# Patient Record
Sex: Male | Born: 1960
Health system: Southern US, Community
[De-identification: ages and names within clinical notes are randomized; demographics above are authoritative.]

## PROBLEM LIST (undated history)

## (undated) DIAGNOSIS — N529 Male erectile dysfunction, unspecified: Secondary | ICD-10-CM

## (undated) DIAGNOSIS — E785 Hyperlipidemia, unspecified: Secondary | ICD-10-CM

## (undated) DIAGNOSIS — T7840XA Allergy, unspecified, initial encounter: Secondary | ICD-10-CM

## (undated) DIAGNOSIS — E119 Type 2 diabetes mellitus without complications: Secondary | ICD-10-CM

## (undated) DIAGNOSIS — I10 Essential (primary) hypertension: Secondary | ICD-10-CM

## (undated) DIAGNOSIS — J302 Other seasonal allergic rhinitis: Secondary | ICD-10-CM

## (undated) DIAGNOSIS — G4730 Sleep apnea, unspecified: Secondary | ICD-10-CM

## (undated) HISTORY — DX: Essential (primary) hypertension: I10

## (undated) HISTORY — DX: Allergy, unspecified, initial encounter: T78.40XA

## (undated) HISTORY — DX: Type 2 diabetes mellitus without complications: E11.9

## (undated) HISTORY — DX: Other seasonal allergic rhinitis: J30.2

## (undated) HISTORY — DX: Male erectile dysfunction, unspecified: N52.9

## (undated) HISTORY — DX: Sleep apnea, unspecified: G47.30

## (undated) HISTORY — DX: Hyperlipidemia, unspecified: E78.5

## (undated) HISTORY — PX: OTHER SURGICAL HISTORY: SHX169

---

## 2005-09-04 ENCOUNTER — Encounter: Admission: RE | Admit: 2005-09-04 | Discharge: 2005-09-04 | Payer: Self-pay | Admitting: Internal Medicine

## 2006-12-30 ENCOUNTER — Ambulatory Visit: Payer: Self-pay | Admitting: Internal Medicine

## 2007-01-20 ENCOUNTER — Emergency Department (HOSPITAL_COMMUNITY): Admission: EM | Admit: 2007-01-20 | Discharge: 2007-01-20 | Payer: Self-pay | Admitting: Emergency Medicine

## 2007-01-21 ENCOUNTER — Ambulatory Visit: Payer: Self-pay | Admitting: *Deleted

## 2007-02-21 ENCOUNTER — Ambulatory Visit: Payer: Self-pay | Admitting: Family Medicine

## 2007-02-21 ENCOUNTER — Encounter (INDEPENDENT_AMBULATORY_CARE_PROVIDER_SITE_OTHER): Payer: Self-pay | Admitting: Internal Medicine

## 2007-02-21 LAB — CONVERTED CEMR LAB
Cholesterol: 178 mg/dL
HDL: 47 mg/dL
Triglycerides: 83 mg/dL

## 2007-02-22 ENCOUNTER — Encounter (INDEPENDENT_AMBULATORY_CARE_PROVIDER_SITE_OTHER): Payer: Self-pay | Admitting: Internal Medicine

## 2007-02-22 LAB — CONVERTED CEMR LAB: PSA: 0.96 ng/mL

## 2007-03-18 ENCOUNTER — Ambulatory Visit: Payer: Self-pay | Admitting: Internal Medicine

## 2007-04-21 ENCOUNTER — Encounter (INDEPENDENT_AMBULATORY_CARE_PROVIDER_SITE_OTHER): Payer: Self-pay | Admitting: Internal Medicine

## 2007-04-21 DIAGNOSIS — J301 Allergic rhinitis due to pollen: Secondary | ICD-10-CM | POA: Insufficient documentation

## 2007-04-21 DIAGNOSIS — E785 Hyperlipidemia, unspecified: Secondary | ICD-10-CM | POA: Insufficient documentation

## 2007-04-21 DIAGNOSIS — E1159 Type 2 diabetes mellitus with other circulatory complications: Secondary | ICD-10-CM | POA: Insufficient documentation

## 2007-04-21 DIAGNOSIS — I10 Essential (primary) hypertension: Secondary | ICD-10-CM | POA: Insufficient documentation

## 2007-05-08 DIAGNOSIS — H11009 Unspecified pterygium of unspecified eye: Secondary | ICD-10-CM | POA: Insufficient documentation

## 2007-05-08 DIAGNOSIS — N529 Male erectile dysfunction, unspecified: Secondary | ICD-10-CM | POA: Insufficient documentation

## 2007-07-13 ENCOUNTER — Telehealth (INDEPENDENT_AMBULATORY_CARE_PROVIDER_SITE_OTHER): Payer: Self-pay | Admitting: Family Medicine

## 2007-07-20 ENCOUNTER — Telehealth (INDEPENDENT_AMBULATORY_CARE_PROVIDER_SITE_OTHER): Payer: Self-pay | Admitting: Family Medicine

## 2007-07-26 ENCOUNTER — Ambulatory Visit: Payer: Self-pay | Admitting: Family Medicine

## 2007-08-30 ENCOUNTER — Ambulatory Visit: Payer: Self-pay | Admitting: Internal Medicine

## 2007-09-27 ENCOUNTER — Ambulatory Visit: Payer: Self-pay | Admitting: Family Medicine

## 2007-09-27 DIAGNOSIS — B9789 Other viral agents as the cause of diseases classified elsewhere: Secondary | ICD-10-CM

## 2007-10-15 DIAGNOSIS — R7309 Other abnormal glucose: Secondary | ICD-10-CM | POA: Insufficient documentation

## 2007-11-08 ENCOUNTER — Ambulatory Visit: Payer: Self-pay | Admitting: Family Medicine

## 2007-11-08 LAB — CONVERTED CEMR LAB
ALT: 33 units/L (ref 0–53)
Albumin: 4.5 g/dL (ref 3.5–5.2)
BUN: 16 mg/dL (ref 6–23)
Calcium: 9.6 mg/dL (ref 8.4–10.5)
Chlamydia, Swab/Urine, PCR: NEGATIVE
Cholesterol: 135 mg/dL (ref 0–200)
Creatinine, Ser: 1 mg/dL (ref 0.40–1.50)
Eosinophils Absolute: 0.1 10*3/uL (ref 0.0–0.7)
Eosinophils Relative: 3 % (ref 0–5)
GC Probe Amp, Urine: NEGATIVE
HDL: 36 mg/dL — ABNORMAL LOW (ref >39)
Hemoglobin: 17.2 g/dL — ABNORMAL HIGH (ref 13.0–17.0)
LDL Cholesterol: 66 mg/dL (ref 0–99)
Lymphs Abs: 2.4 10*3/uL (ref 0.7–4.0)
Monocytes Absolute: 0.3 10*3/uL (ref 0.1–1.0)
Monocytes Relative: 7 % (ref 3–12)
Neutrophils Relative %: 37 % — ABNORMAL LOW (ref 43–77)
Platelets: 220 10*3/uL (ref 150–400)
RBC: 5.46 M/uL (ref 4.22–5.81)
Total Bilirubin: 0.7 mg/dL (ref 0.3–1.2)
Total Protein: 7.9 g/dL (ref 6.0–8.3)
Triglycerides: 167 mg/dL — ABNORMAL HIGH (ref <150)
VLDL: 33 mg/dL (ref 0–40)

## 2007-11-10 ENCOUNTER — Encounter (INDEPENDENT_AMBULATORY_CARE_PROVIDER_SITE_OTHER): Payer: Self-pay | Admitting: Family Medicine

## 2007-11-15 ENCOUNTER — Ambulatory Visit: Payer: Self-pay | Admitting: Family Medicine

## 2007-11-15 LAB — CONVERTED CEMR LAB: Cholesterol: 130 mg/dL (ref 0–200)

## 2008-02-20 ENCOUNTER — Telehealth (INDEPENDENT_AMBULATORY_CARE_PROVIDER_SITE_OTHER): Payer: Self-pay | Admitting: *Deleted

## 2008-03-21 ENCOUNTER — Ambulatory Visit: Payer: Self-pay | Admitting: Family Medicine

## 2008-06-13 ENCOUNTER — Ambulatory Visit: Payer: Self-pay | Admitting: Family Medicine

## 2008-06-13 DIAGNOSIS — F329 Major depressive disorder, single episode, unspecified: Secondary | ICD-10-CM

## 2008-06-13 DIAGNOSIS — M719 Bursopathy, unspecified: Secondary | ICD-10-CM

## 2008-06-13 DIAGNOSIS — M67919 Unspecified disorder of synovium and tendon, unspecified shoulder: Secondary | ICD-10-CM | POA: Insufficient documentation

## 2008-08-13 ENCOUNTER — Ambulatory Visit: Payer: Self-pay | Admitting: Family Medicine

## 2008-08-15 ENCOUNTER — Telehealth (INDEPENDENT_AMBULATORY_CARE_PROVIDER_SITE_OTHER): Payer: Self-pay | Admitting: Family Medicine

## 2008-08-15 LAB — CONVERTED CEMR LAB
Albumin: 4.3 g/dL (ref 3.5–5.2)
BUN: 18 mg/dL (ref 6–23)
CO2: 24 meq/L (ref 19–32)
Calcium: 9.5 mg/dL (ref 8.4–10.5)
Chloride: 101 meq/L (ref 96–112)
Glucose, Bld: 153 mg/dL — ABNORMAL HIGH (ref 70–99)
HDL: 46 mg/dL (ref >39)
Total Bilirubin: 0.7 mg/dL (ref 0.3–1.2)
Total CHOL/HDL Ratio: 3.8
Triglycerides: 280 mg/dL — ABNORMAL HIGH (ref <150)

## 2008-12-11 ENCOUNTER — Ambulatory Visit: Payer: Self-pay | Admitting: Internal Medicine

## 2008-12-11 ENCOUNTER — Encounter (INDEPENDENT_AMBULATORY_CARE_PROVIDER_SITE_OTHER): Payer: Self-pay | Admitting: Family Medicine

## 2008-12-18 ENCOUNTER — Ambulatory Visit: Payer: Self-pay | Admitting: Family Medicine

## 2008-12-18 LAB — CONVERTED CEMR LAB
Albumin: 4.4 g/dL (ref 3.5–5.2)
BUN: 14 mg/dL (ref 6–23)
CO2: 29 meq/L (ref 19–32)
Chloride: 100 meq/L (ref 96–112)
Cholesterol: 133 mg/dL (ref 0–200)
Glucose, Bld: 87 mg/dL (ref 70–99)
Potassium: 4 meq/L (ref 3.5–5.3)
Total CHOL/HDL Ratio: 3.1
Triglycerides: 85 mg/dL (ref <150)
VLDL: 17 mg/dL (ref 0–40)

## 2008-12-19 ENCOUNTER — Encounter (INDEPENDENT_AMBULATORY_CARE_PROVIDER_SITE_OTHER): Payer: Self-pay | Admitting: Family Medicine

## 2009-01-03 ENCOUNTER — Telehealth (INDEPENDENT_AMBULATORY_CARE_PROVIDER_SITE_OTHER): Payer: Self-pay | Admitting: Family Medicine

## 2009-03-18 ENCOUNTER — Ambulatory Visit: Payer: Self-pay | Admitting: Physician Assistant

## 2009-03-18 DIAGNOSIS — M25539 Pain in unspecified wrist: Secondary | ICD-10-CM

## 2009-03-18 DIAGNOSIS — M25569 Pain in unspecified knee: Secondary | ICD-10-CM | POA: Insufficient documentation

## 2009-03-18 DIAGNOSIS — D409 Neoplasm of uncertain behavior of male genital organ, unspecified: Secondary | ICD-10-CM

## 2009-03-18 LAB — CONVERTED CEMR LAB
CO2: 24 meq/L
Glucose, Bld: 95 mg/dL
Potassium: 3.9 meq/L
RPR Titer: NONREACTIVE
Sodium: 143 meq/L

## 2009-03-19 ENCOUNTER — Encounter: Payer: Self-pay | Admitting: Physician Assistant

## 2009-03-25 ENCOUNTER — Encounter: Payer: Self-pay | Admitting: Physician Assistant

## 2009-05-15 ENCOUNTER — Encounter: Payer: Self-pay | Admitting: Physician Assistant

## 2009-05-29 ENCOUNTER — Ambulatory Visit: Payer: Self-pay | Admitting: Physician Assistant

## 2009-05-29 LAB — CONVERTED CEMR LAB
BUN: 13 mg/dL (ref 6–23)
CO2: 26 meq/L (ref 19–32)
Calcium: 9.4 mg/dL (ref 8.4–10.5)
Chloride: 102 meq/L (ref 96–112)
Creatinine, Ser: 1.12 mg/dL (ref 0.40–1.50)
Potassium: 4.1 meq/L (ref 3.5–5.3)
Total Bilirubin: 0.6 mg/dL (ref 0.3–1.2)
Total Protein: 7.1 g/dL (ref 6.0–8.3)

## 2009-05-30 ENCOUNTER — Encounter: Payer: Self-pay | Admitting: Physician Assistant

## 2009-06-03 ENCOUNTER — Ambulatory Visit (HOSPITAL_COMMUNITY): Admission: RE | Admit: 2009-06-03 | Discharge: 2009-06-03 | Payer: Self-pay | Admitting: Physician Assistant

## 2009-06-05 ENCOUNTER — Encounter: Payer: Self-pay | Admitting: Physician Assistant

## 2009-07-11 ENCOUNTER — Ambulatory Visit: Payer: Self-pay | Admitting: Physician Assistant

## 2009-07-15 ENCOUNTER — Telehealth: Payer: Self-pay | Admitting: Physician Assistant

## 2009-10-15 ENCOUNTER — Telehealth: Payer: Self-pay | Admitting: Physician Assistant

## 2009-10-28 ENCOUNTER — Ambulatory Visit: Payer: Self-pay | Admitting: Physician Assistant

## 2009-10-28 DIAGNOSIS — R9431 Abnormal electrocardiogram [ECG] [EKG]: Secondary | ICD-10-CM

## 2009-10-28 DIAGNOSIS — R0989 Other specified symptoms and signs involving the circulatory and respiratory systems: Secondary | ICD-10-CM

## 2009-10-28 DIAGNOSIS — R0609 Other forms of dyspnea: Secondary | ICD-10-CM

## 2009-10-28 DIAGNOSIS — M545 Low back pain: Secondary | ICD-10-CM

## 2009-10-28 DIAGNOSIS — B360 Pityriasis versicolor: Secondary | ICD-10-CM

## 2009-10-28 LAB — CONVERTED CEMR LAB: Rapid HIV Screen: NEGATIVE

## 2009-10-29 LAB — CONVERTED CEMR LAB
ALT: 26 units/L (ref 0–53)
Albumin: 4.2 g/dL (ref 3.5–5.2)
BUN: 19 mg/dL (ref 6–23)
CO2: 26 meq/L (ref 19–32)
Creatinine, Ser: 1.06 mg/dL (ref 0.40–1.50)
GC Probe Amp, Urine: NEGATIVE
Glucose, Bld: 108 mg/dL — ABNORMAL HIGH (ref 70–99)
LDL Cholesterol: 102 mg/dL — ABNORMAL HIGH (ref 0–99)
Total Bilirubin: 0.4 mg/dL (ref 0.3–1.2)
VLDL: 23 mg/dL (ref 0–40)

## 2009-11-11 ENCOUNTER — Ambulatory Visit: Payer: Self-pay | Admitting: Physician Assistant

## 2009-11-12 ENCOUNTER — Encounter: Payer: Self-pay | Admitting: Physician Assistant

## 2009-11-12 ENCOUNTER — Encounter: Admission: RE | Admit: 2009-11-12 | Discharge: 2010-01-15 | Payer: Self-pay | Admitting: Physician Assistant

## 2009-11-12 DIAGNOSIS — E1165 Type 2 diabetes mellitus with hyperglycemia: Secondary | ICD-10-CM | POA: Insufficient documentation

## 2009-11-12 DIAGNOSIS — Z794 Long term (current) use of insulin: Secondary | ICD-10-CM

## 2009-11-12 LAB — CONVERTED CEMR LAB: Hgb A1c MFr Bld: 7.5 % — ABNORMAL HIGH (ref 4.6–6.1)

## 2009-12-03 ENCOUNTER — Ambulatory Visit: Payer: Self-pay | Admitting: Physician Assistant

## 2009-12-09 ENCOUNTER — Telehealth: Payer: Self-pay | Admitting: Physician Assistant

## 2009-12-12 ENCOUNTER — Ambulatory Visit: Payer: Self-pay | Admitting: Physician Assistant

## 2009-12-12 DIAGNOSIS — M109 Gout, unspecified: Secondary | ICD-10-CM

## 2009-12-17 ENCOUNTER — Ambulatory Visit (HOSPITAL_COMMUNITY): Admission: RE | Admit: 2009-12-17 | Discharge: 2009-12-17 | Payer: Self-pay | Admitting: Internal Medicine

## 2009-12-17 ENCOUNTER — Encounter: Payer: Self-pay | Admitting: Physician Assistant

## 2009-12-17 LAB — CONVERTED CEMR LAB
BUN: 19 mg/dL (ref 6–23)
Basophils Absolute: 0 10*3/uL (ref 0.0–0.1)
Basophils Relative: 1 % (ref 0–1)
CO2: 28 meq/L (ref 19–32)
Calcium: 9.7 mg/dL (ref 8.4–10.5)
Eosinophils Absolute: 0.3 10*3/uL (ref 0.0–0.7)
Glucose, Bld: 70 mg/dL (ref 70–99)
Hemoglobin: 15.5 g/dL (ref 13.0–17.0)
Lymphocytes Relative: 41 % (ref 12–46)
MCHC: 33 g/dL (ref 30.0–36.0)
Platelets: 332 10*3/uL (ref 150–400)
RBC: 5.12 M/uL (ref 4.22–5.81)
Sodium: 142 meq/L (ref 135–145)
WBC: 6.5 10*3/uL (ref 4.0–10.5)

## 2009-12-23 ENCOUNTER — Encounter: Payer: Self-pay | Admitting: Physician Assistant

## 2009-12-24 ENCOUNTER — Ambulatory Visit: Payer: Self-pay | Admitting: Physician Assistant

## 2009-12-24 DIAGNOSIS — J029 Acute pharyngitis, unspecified: Secondary | ICD-10-CM

## 2009-12-24 LAB — CONVERTED CEMR LAB: Microalb, Ur: 0.81 mg/dL (ref 0.00–1.89)

## 2009-12-25 ENCOUNTER — Encounter: Payer: Self-pay | Admitting: Physician Assistant

## 2010-01-07 ENCOUNTER — Ambulatory Visit: Payer: Self-pay | Admitting: Internal Medicine

## 2010-01-27 ENCOUNTER — Encounter: Payer: Self-pay | Admitting: Physician Assistant

## 2010-01-27 ENCOUNTER — Ambulatory Visit (HOSPITAL_BASED_OUTPATIENT_CLINIC_OR_DEPARTMENT_OTHER): Admission: RE | Admit: 2010-01-27 | Discharge: 2010-01-27 | Payer: Self-pay | Admitting: Internal Medicine

## 2010-01-28 ENCOUNTER — Ambulatory Visit: Payer: Self-pay | Admitting: Physician Assistant

## 2010-02-01 ENCOUNTER — Ambulatory Visit: Payer: Self-pay | Admitting: Internal Medicine

## 2010-02-10 ENCOUNTER — Ambulatory Visit: Payer: Self-pay | Admitting: Physician Assistant

## 2010-02-10 ENCOUNTER — Telehealth: Payer: Self-pay | Admitting: Physician Assistant

## 2010-02-10 DIAGNOSIS — R82998 Other abnormal findings in urine: Secondary | ICD-10-CM | POA: Insufficient documentation

## 2010-02-10 LAB — CONVERTED CEMR LAB
Bilirubin Urine: NEGATIVE
Blood Glucose, Fingerstick: 111
Glucose, Urine, Semiquant: NEGATIVE

## 2010-02-11 ENCOUNTER — Encounter: Payer: Self-pay | Admitting: Physician Assistant

## 2010-02-12 ENCOUNTER — Encounter: Payer: Self-pay | Admitting: Physician Assistant

## 2010-02-12 DIAGNOSIS — G4733 Obstructive sleep apnea (adult) (pediatric): Secondary | ICD-10-CM | POA: Insufficient documentation

## 2010-02-12 DIAGNOSIS — G4730 Sleep apnea, unspecified: Secondary | ICD-10-CM

## 2010-02-12 LAB — CONVERTED CEMR LAB
ALT: 22 units/L (ref 0–53)
Alkaline Phosphatase: 62 units/L (ref 39–117)
BUN: 19 mg/dL (ref 6–23)
CO2: 26 meq/L (ref 19–32)
Calcium: 9.4 mg/dL (ref 8.4–10.5)
Casts: NONE SEEN /lpf
Chloride: 101 meq/L (ref 96–112)
Creatinine, Ser: 1.19 mg/dL (ref 0.40–1.50)
Crystals: NONE SEEN
Glucose, Bld: 89 mg/dL (ref 70–99)
HDL: 38 mg/dL — ABNORMAL LOW (ref >39)
PSA: 0.81 ng/mL (ref 0.10–4.00)
Potassium: 4.3 meq/L (ref 3.5–5.3)
RBC / HPF: NONE SEEN (ref <3)
Sodium: 138 meq/L (ref 135–145)
Total Bilirubin: 0.7 mg/dL (ref 0.3–1.2)
Total CHOL/HDL Ratio: 3.8
Triglycerides: 123 mg/dL (ref <150)
VLDL: 25 mg/dL (ref 0–40)

## 2010-03-06 ENCOUNTER — Ambulatory Visit: Admission: RE | Admit: 2010-03-06 | Discharge: 2010-03-06 | Payer: Self-pay | Admitting: Internal Medicine

## 2010-03-06 ENCOUNTER — Encounter: Payer: Self-pay | Admitting: Physician Assistant

## 2010-03-06 ENCOUNTER — Encounter (INDEPENDENT_AMBULATORY_CARE_PROVIDER_SITE_OTHER): Payer: Self-pay | Admitting: Internal Medicine

## 2010-03-12 ENCOUNTER — Ambulatory Visit: Payer: Self-pay | Admitting: Physician Assistant

## 2010-03-24 ENCOUNTER — Encounter (INDEPENDENT_AMBULATORY_CARE_PROVIDER_SITE_OTHER): Payer: Self-pay | Admitting: *Deleted

## 2010-03-26 ENCOUNTER — Encounter: Payer: Self-pay | Admitting: Physician Assistant

## 2010-03-26 ENCOUNTER — Ambulatory Visit: Payer: Self-pay | Admitting: Gastroenterology

## 2010-03-26 DIAGNOSIS — I517 Cardiomegaly: Secondary | ICD-10-CM

## 2010-04-09 ENCOUNTER — Ambulatory Visit: Payer: Self-pay | Admitting: Gastroenterology

## 2010-04-11 ENCOUNTER — Encounter: Payer: Self-pay | Admitting: Gastroenterology

## 2010-04-18 ENCOUNTER — Encounter: Payer: Self-pay | Admitting: Gastroenterology

## 2010-04-21 ENCOUNTER — Encounter: Payer: Self-pay | Admitting: Physician Assistant

## 2010-04-21 DIAGNOSIS — K621 Rectal polyp: Secondary | ICD-10-CM

## 2010-04-21 DIAGNOSIS — K620 Anal polyp: Secondary | ICD-10-CM | POA: Insufficient documentation

## 2010-05-16 ENCOUNTER — Ambulatory Visit: Payer: Self-pay | Admitting: Physician Assistant

## 2010-06-13 ENCOUNTER — Ambulatory Visit: Payer: Self-pay | Admitting: Internal Medicine

## 2010-09-23 NOTE — Assessment & Plan Note (Signed)
Summary: left knee swelling///kt   Vital Signs:  Patient profile:   50 year old male Height:      66 inches Weight:      205 pounds BMI:     33.21 Temp:     97.5 degrees F oral Pulse rate:   77 / minute Pulse rhythm:   regular Resp:     18 per minute BP sitting:   144 / 89  (left arm) Cuff size:   large  Vitals Entered By: [place] Shannon (Dec 24, 2009 2:07 PM) CC: f/u on left knee..., Hypertension Management Is Patient Diabetic? Yes Pain Assessment Patient in pain? no       Does patient need assistance? Functional Status Self care Ambulation Normal   Primary Care Provider:  Tereso Newcomer PA-C  CC:  f/u on left knee... and Hypertension Management.  History of Present Illness: Here for follow up.  Complaining of sore throat for 5 days.  Symptoms worse since last night.  No fever but brief chills.  Notes cough with thick yellow sputum.  Cough started about 5 days ago.  No hemoptysis or dyspnea.  No chest pain.  Has not taken anything for it.  Noted eye discharge and redness this am.  Discharge was yellow.  Using allergy drops.  Does note some discharge on eyelids.  Does have allergies.  Allergies usually result in red eyes and itching.  So, not much difference in his eye symptoms.  Some pain that has resolved.  Some blurry vision that has resolved.  Gout:  Pain in right great toe improved.  Still has some pain.  Only taking Indocin as needed but is having to take every day.  Uric acid level was elevated.  Will need to start allopurinol when his pain is resolved.  Knee pain is better.  He is going to restart PT soon.  DM:  Discussed role of medications and need to get A1C below 7.  Discussed complications assoc with diabetes.  He has changed diet.  Explained that I will start meds if next A1C is above 7.   Hypertension History:      Positive major cardiovascular risk factors include male age 62 years old or older, diabetes, hyperlipidemia, and hypertension.  Negative major  cardiovascular risk factors include negative family history for ischemic heart disease and non-tobacco-user status.     Current Medications (verified): 1)  Hydrochlorothiazide 25 Mg  Tabs (Hydrochlorothiazide) .... Once Daily 2)  Diovan 320 Mg Tabs (Valsartan) .... Take 1 Tablet By Mouth Every Morning 3)  Crestor 10 Mg  Tabs (Rosuvastatin Calcium) .Marland Kitchen.. 1 Tab Po Daily 4)  Ibuprofen 800 Mg Tabs (Ibuprofen) .... Hold - Started On Indomethacin For Gout 5)  Veramyst 27.5 Mcg/spray Susp (Fluticasone Furoate) .... Use 2 Sprays in Each Nostril Once Daily 6)  Allegra 180 Mg Tabs (Fexofenadine Hcl) .... Take 1 Tablet By Mouth Once A Day As Needed For Allergies 7)  Tessalon Perles 100 Mg Caps (Benzonatate) .... Take 1 Tablet By Mouth Three Times A Day As Needed For Cough 8)  Tramadol Hcl 50 Mg Tabs (Tramadol Hcl) .... Take 1 Tablet By Mouth Three Times A Day As Needed For Knee Pain 9)  Clonidine Hcl 0.2 Mg Tabs (Clonidine Hcl) .... Take 1 Tablet By Mouth Two Times A Day (Note Dose Increase) 10)  Selenium Sulfide 2.5 % Lotn (Selenium Sulfide) .... Apply Once Daily X 7 Days; Then Apply Once A Month For 3 Months. 11)  Indomethacin 25 Mg  Caps (Indomethacin) .Marland Kitchen.. 1-2 Tabs By Mouth Three Times A Day As Needed For Pain  Allergies (verified): 1)  ! Penicillin 2)  ! * Enalapril 3)  ! * Pesticides  Physical Exam  General:  alert, well-developed, and well-nourished.   Head:  normocephalic and atraumatic.   Eyes:  pupils equal, pupils round, pupils reactive to light, and conjunctival injection.   Ears:  R ear normal and L ear normal.   Nose:  no external deformity.   Mouth:  pharyngeal erythema.   no exudate  Neck:  no cervical lymphadenopathy.   Lungs:  normal breath sounds, no crackles, and no wheezes.   Heart:  normal rate and regular rhythm.   Msk:  right great toe improved no redness no warmth slight tend to palp  Neurologic:  alert & oriented X3 and cranial nerves II-XII intact.   Psych:   normally interactive.     Impression & Recommendations:  Problem # 1:  DIABETES MELLITUS, TYPE II (ICD-250.00)  Discussed role of medications and need to get A1C below 7.  Discussed complications assoc with diabetes.  He has changed diet.  Explained that I will start meds if next A1C is above 7.  His updated medication list for this problem includes:    Diovan 320 Mg Tabs (Valsartan) .Marland Kitchen... Take 1 tablet by mouth every morning  Orders: T-Urine Microalbumin w/creat. ratio [phone])  Problem # 2:  HYPERTENSION (ICD-401.9) needs better control has had trouble with ED with norvasc and atenolol in past suggest he try norvasc again as beta blockers more likely to cause ED  His updated medication list for this problem includes:    Hydrochlorothiazide 25 Mg Tabs (Hydrochlorothiazide) ..... Once daily    Diovan 320 Mg Tabs (Valsartan) .Marland Kitchen... Take 1 tablet by mouth every morning    Clonidine Hcl 0.2 Mg Tabs (Clonidine hcl) .Marland Kitchen... Take 1 tablet by mouth two times a day (note dose increase)    Norvasc 5 Mg Tabs (Amlodipine besylate) .Marland Kitchen... Take 1 tablet by mouth once a day  Problem # 3:  GOUT, UNSPECIFIED (ICD-274.9) improved start allopurinol once pain is gone  His updated medication list for this problem includes:    Ibuprofen 800 Mg Tabs (Ibuprofen) ..... Hold - started on indomethacin for gout    Indomethacin 25 Mg Caps (Indomethacin) .Marland Kitchen... 1-2 tabs by mouth three times a day as needed for pain    Allopurinol 300 Mg Tabs (Allopurinol) .Marland Kitchen... Take 1 tablet by mouth once a day  Problem # 4:  PENILE LESION (ICD-236.6) will get him to reschedule with derm  Problem # 5:  PHARYNGITIS (ICD-462) concerned about poss of bacterial infection with description of symptoms all to PCN will tx with zpak  His updated medication list for this problem includes:    Ibuprofen 800 Mg Tabs (Ibuprofen) ..... Hold - started on indomethacin for gout    Indomethacin 25 Mg Caps (Indomethacin) .Marland Kitchen... 1-2  tabs by mouth three times a day as needed for pain    Zithromax Z-pak 250 Mg Tabs (Azithromycin) .Marland Kitchen... Take as directed  Complete Medication List: 1)  Hydrochlorothiazide 25 Mg Tabs (Hydrochlorothiazide) .... Once daily 2)  Diovan 320 Mg Tabs (Valsartan) .... Take 1 tablet by mouth every morning 3)  Crestor 10 Mg Tabs (Rosuvastatin calcium) .Marland Kitchen.. 1 tab po daily 4)  Ibuprofen 800 Mg Tabs (Ibuprofen) .... Hold - started on indomethacin for gout 5)  Veramyst 27.5 Mcg/spray Susp (Fluticasone furoate) .... Use 2 sprays in each nostril once  daily 6)  Allegra 180 Mg Tabs (Fexofenadine hcl) .... Take 1 tablet by mouth once a day as needed for allergies 7)  Tessalon Perles 100 Mg Caps (Benzonatate) .... Take 1 tablet by mouth three times a day as needed for cough 8)  Tramadol Hcl 50 Mg Tabs (Tramadol hcl) .... Take 1 tablet by mouth three times a day as needed for knee pain 9)  Clonidine Hcl 0.2 Mg Tabs (Clonidine hcl) .... Take 1 tablet by mouth two times a day (note dose increase) 10)  Selenium Sulfide 2.5 % Lotn (Selenium sulfide) .... Apply once daily x 7 days; then apply once a month for 3 months. 11)  Indomethacin 25 Mg Caps (Indomethacin) .Marland Kitchen.. 1-2 tabs by mouth three times a day as needed for pain 12)  Allopurinol 300 Mg Tabs (Allopurinol) .... Take 1 tablet by mouth once a day 13)  Norvasc 5 Mg Tabs (Amlodipine besylate) .... Take 1 tablet by mouth once a day 14)  Zithromax Z-pak 250 Mg Tabs (Azithromycin) .... Take as directed  Hypertension Assessment/Plan:      The patient's hypertensive risk group is category C: Target organ damage and/or diabetes.  His calculated 10 year risk of coronary heart disease is 18 %.  Today's blood pressure is 144/89.  His blood pressure goal is < 140/90.  Patient Instructions: 1)  Start the Allopurinol once your pain in your toe has resolved. 2)  Schedule lab visit 4-6 weeks after you start Allopurinol to check uric acid level (Dx: 274.9). 3)  [facility] appt with  [facility].  He missed last appt. 4)  Schedule Retasure at [facility] Clinic. 5)  Please schedule a follow-up appointment in 2 months with Edla Para for diabetes and blood pressure.  Prescriptions: ZITHROMAX Z-PAK 250 MG TABS (AZITHROMYCIN) take as directed  #1 pack x 0   Entered and Authorized by:   Tereso Newcomer PA-C   Signed by:   Tereso Newcomer PA-C on 12/24/2009   Method used:   Print then Give to Patient   RxID:   [id number] NORVASC 5 MG TABS (AMLODIPINE BESYLATE) Take 1 tablet by mouth once a day  #30 x 5   Entered and Authorized by:   Tereso Newcomer PA-C   Signed by:   Tereso Newcomer PA-C on 12/24/2009   Method used:   Print then Give to Patient   RxID:   [id number] ALLOPURINOL 300 MG TABS (ALLOPURINOL) Take 1 tablet by mouth once a day  #30 x 5   Entered and Authorized by:   Tereso Newcomer PA-C   Signed by:   Tereso Newcomer PA-C on 12/24/2009   Method used:   Print then Give to Patient   RxID:   [id number]

## 2010-09-23 NOTE — Progress Notes (Signed)
Summary: DROPPED PR PROGRESS NOTE  Phone Note Call from Patient Call back at Home Phone [phone]   Summary of Call: WEAVER PT. PAUL DROPPED OFF HIS PROGRESS NOTE FROM HIS PHYSICAL THERAPY SESSION THIS MORNING, AND WANTS YOU TO LOOK AT IT. Initial call taken by: Leodis Rains,  December 09, 2009 9:38 AM  Follow-up for Phone Call        forward to provider Follow-up by: [place] Shannon,  December 09, 2009 5:11 PM  Additional Follow-up for Phone Call Additional follow up Details #1::        where is it? Additional Follow-up by: Tereso Newcomer PA-C,  December 09, 2009 5:17 PM    Additional Follow-up for Phone Call Additional follow up Details #2::    it is in outguide on your shelf Follow-up by: [place] Shannon,  December 10, 2009 12:26 PM  Additional Follow-up for Phone Call Additional follow up Details #3:: Details for Additional Follow-up Action Taken: He needs a f/u appt. next week. I need to discuss his diabetes with him and find out what is going on with his knee. Tereso Newcomer PA-C  December 10, 2009 1:35 PM  pt comes in tomorrow at 17..Armenia Shannon  December 10, 2009 2:32 PM

## 2010-09-23 NOTE — Letter (Signed)
Summary: *HSN Results Follow up  [facility]  [address] [place], [place] [address]   Phone: [phone]  Fax: [phone]      12/25/2009   AVI KERSCHNER [address] [place], [place]  [address]   Dear  Mr. DEAVON PODGORSKI,                            ____S.Drinkard,FNP   ____D. Gore,FNP       ____B. McPherson,MD   ____V. Rankins,MD    ____E. Mulberry,MD    ____N. Daphine Deutscher, FNP  ____D. Reche Dixon, MD    ____K. Philipp Deputy, MD    __x__S. Alben Spittle, PA-C     This letter is to inform you that your recent test(s):  _______Pap Smear    _______Lab Test     _______X-ray    _______ is within acceptable limits  _______ requires a medication change  _______ requires a follow-up lab visit  _______ requires a follow-up visit with your provider   Comments:  Urine test was ok . . . .no signs of protein in your urine.       _________________________________________________________ If you have any questions, please contact our office                     Sincerely,  Tereso Newcomer PA-C [facility]

## 2010-09-23 NOTE — Progress Notes (Signed)
Summary: GI referral  Phone Note Outgoing Call   Summary of Call: Refer to Dr. Doreatha Martin for screening colo. Initial call taken by: Brynda Rim,  February 10, 2010 9:39 AM

## 2010-09-23 NOTE — Miscellaneous (Signed)
Summary: Rehab Report//INITIAL SUMMARY  Rehab Report//INITIAL SUMMARY   Imported By: Arta Bruce 11/18/2009 10:44:24  _____________________________________________________________________  External Attachment:    Type:   Image     Comment:   External Document

## 2010-09-23 NOTE — Letter (Signed)
Summary: NUTRETIONIST SUMMARY  NUTRETIONIST SUMMARY   Imported By: Arta Bruce 12/12/2009 09:44:14  _____________________________________________________________________  External Attachment:    Type:   Image     Comment:   External Document

## 2010-09-23 NOTE — Miscellaneous (Signed)
Summary: RENEWAL SUMMARY  RENEWAL SUMMARY   Imported By: Arta Bruce 12/23/2009 08:55:10  _____________________________________________________________________  External Attachment:    Type:   Image     Comment:   External Document

## 2010-09-23 NOTE — Assessment & Plan Note (Signed)
Summary: DM and HTN   Vital Signs:  Patient profile:   50 year old male Height:      66 inches Weight:      212 pounds BMI:     34.34 Temp:     97.2 degrees F oral Pulse rate:   78 / minute Pulse rhythm:   regular Resp:     16 per minute BP sitting:   122 / 80  (left arm) Cuff size:   large  Vitals Entered By: [place] Shannon (May 16, 2010 11:45 AM) CC: f/u... med review..., Hypertension Management Is Patient Diabetic? Yes Pain Assessment Patient in pain? no       Does patient need assistance? Functional Status Self care Ambulation Normal   Primary Care Provider:  Tereso Newcomer PA-C  CC:  f/u... med review... and Hypertension Management.  History of Present Illness: Here for f/u.  Feels well.  No complaints.  Now wearing cpap.  Walking about 4 miles a day.  No gout pain. Taking all meds.   He continues on diet therapy for DM.  He needs a retasure.    Hypertension History:      He denies headache, chest pain, dyspnea with exertion, and side effects from treatment.        Positive major cardiovascular risk factors include male age 95 years old or older, diabetes, hyperlipidemia, and hypertension.  Negative major cardiovascular risk factors include negative family history for ischemic heart disease and non-tobacco-user status.     Problems Prior to Update: 1)  Colonic Polyps, Adenomatous, Benign  (ICD-569.0) 2)  Ventricular Hypertrophy, Left  (ICD-429.3) 3)  Sleep Apnea  (ICD-780.57) 4)  Urinalysis, Abnormal  (ICD-791.9) 5)  Preventive Health Care  (ICD-V70.0) 6)  Pharyngitis  (ICD-462) 7)  Gout, Unspecified  (ICD-274.9) 8)  Diabetes Mellitus, Type II  (ICD-250.00) 9)  Lumbago  (ICD-724.2) 10)  Tinea Versicolor  (ICD-111.0) 11)  Electrocardiogram, Abnormal  (ICD-794.31) 12)  Snoring  (ICD-786.09) 13)  Wrist Pain, Left  (ICD-719.43) 14)  Knee Pain, Bilateral  (ICD-719.46) 15)  Penile Lesion  (ICD-236.6) 16)  Other Abnormal Glucose  (ICD-790.29) 17)  Rotator  Cuff Syndrome, Left  (ICD-726.10) 18)  Depression  (ICD-311) 19)  Std Screening  (ICD-V01.6) 20)  Viral Infection, Acute  (ICD-079.99) 21)  Erectile Dysfunction, Secondary To Medication  (ICD-607.84) 22)  Pterygium, Bilateral  (ICD-372.40) 23)  Dislocation, Recurrent, R Shoulder  (ICD-718.31) 24)  Hypertension  (ICD-401.9) 25)  Hyperlipidemia  (ICD-272.4) 26)  Allergic Rhinitis, Seasonal  (ICD-477.0)  Current Medications (verified): 1)  Hydrochlorothiazide 25 Mg  Tabs (Hydrochlorothiazide) .... Once Daily 2)  Diovan 320 Mg Tabs (Valsartan) .... Take 1 Tablet By Mouth Every Morning 3)  Crestor 10 Mg  Tabs (Rosuvastatin Calcium) .Marland Kitchen.. 1 Tab Po Daily 4)  Xyzal 5 Mg Tabs (Levocetirizine Dihydrochloride) .... Take One Tab Daily As Needed For Alletgies 5)  Tramadol Hcl 50 Mg Tabs (Tramadol Hcl) .... Take 1 Tablet By Mouth Three Times A Day As Needed For Knee Pain 6)  Clonidine Hcl 0.2 Mg Tabs (Clonidine Hcl) .... Take 1 Tablet By Mouth Two Times A Day (Note Dose Increase) 7)  Indomethacin 25 Mg Caps (Indomethacin) .Marland Kitchen.. 1-2 Tabs By Mouth Three Times A Day As Needed For Pain 8)  Allopurinol 300 Mg Tabs (Allopurinol) .... Take 1 Tablet By Mouth Once A Day 9)  Norvasc 5 Mg Tabs (Amlodipine Besylate) .... Take 1 Tablet By Mouth Once A Day 10)  Fish Oil 1000 Mg Caps (Omega-3 Fatty  Acids) .... Take 1 Capsule By Mouth Once A Day  Allergies (verified): 1)  ! Penicillin 2)  ! * Enalapril 3)  ! * Pesticides  Past History:  Past Medical History: ERECTILE DYSFUNCTION, SECONDARY TO MEDICATION (ICD-607.84) PTERYGIUM, BILATERAL (ICD-372.40) DISLOCATION, RECURRENT, R SHOULDER (ICD-718.31) HYPERTENSION (ICD-401.9) HYPERLIPIDEMIA (ICD-272.4) ALLERGIC RHINITIS, SEASONAL (ICD-477.0) Sleep Apnea (AHI 20.6 per hour - dx 01/2010)   a.  on CPAP Echo 02/2010:  Mod to Severe LVH; EF 65-70%; Grade 2 diastolic dysfxn; mild LAE Colo 8.17.2011:  polyps; path:  tubular adenomas, neg for high grade dysplasia or  malignancy    a.  repeat colo 2016  Physical Exam  General:  alert, well-developed, and well-nourished.   Head:  normocephalic and atraumatic.   Eyes:  conjunctival injection.   Neck:  no thyromegaly.   Lungs:  normal breath sounds, no crackles, and no wheezes.   Heart:  normal rate and regular rhythm.   Extremities:  no edema  Neurologic:  alert & oriented X3, cranial nerves II-XII intact, and gait normal.   Psych:  normally interactive.     Impression & Recommendations:  Problem # 1:  DIABETES MELLITUS, TYPE II (ICD-250.00) well controlled  His updated medication list for this problem includes:    Diovan 320 Mg Tabs (Valsartan) .Marland Kitchen... Take 1 tablet by mouth every morning  Orders: Capillary Blood Glucose/CBG (8[address]48) Hemoglobin A1C ([address])  Problem # 2:  HYPERTENSION (ICD-401.9) well controlled doing so much better suspect tx of OSA is helping as well  His updated medication list for this problem includes:    Hydrochlorothiazide 25 Mg Tabs (Hydrochlorothiazide) ..... Once daily    Diovan 320 Mg Tabs (Valsartan) .Marland Kitchen... Take 1 tablet by mouth every morning    Clonidine Hcl 0.2 Mg Tabs (Clonidine hcl) .Marland Kitchen... Take 1 tablet by mouth two times a day (note dose increase)    Norvasc 5 Mg Tabs (Amlodipine besylate) .Marland Kitchen... Take 1 tablet by mouth once a day  Problem # 3:  SLEEP APNEA (ICD-780.57) now on CPAP  Problem # 4:  GOUT, UNSPECIFIED (ICD-274.9) no attacks  His updated medication list for this problem includes:    Indomethacin 25 Mg Caps (Indomethacin) .Marland Kitchen... 1-2 tabs by mouth three times a day as needed for pain    Allopurinol 300 Mg Tabs (Allopurinol) .Marland Kitchen... Take 1 tablet by mouth once a day  Complete Medication List: 1)  Hydrochlorothiazide 25 Mg Tabs (Hydrochlorothiazide) .... Once daily 2)  Diovan 320 Mg Tabs (Valsartan) .... Take 1 tablet by mouth every morning 3)  Crestor 10 Mg Tabs (Rosuvastatin calcium) .Marland Kitchen.. 1 tab po daily 4)  Xyzal 5 Mg Tabs (Levocetirizine  dihydrochloride) .... Take one tab daily as needed for alletgies 5)  Tramadol Hcl 50 Mg Tabs (Tramadol hcl) .... Take 1 tablet by mouth three times a day as needed for knee pain 6)  Clonidine Hcl 0.2 Mg Tabs (Clonidine hcl) .... Take 1 tablet by mouth two times a day (note dose increase) 7)  Indomethacin 25 Mg Caps (Indomethacin) .Marland Kitchen.. 1-2 tabs by mouth three times a day as needed for pain 8)  Allopurinol 300 Mg Tabs (Allopurinol) .... Take 1 tablet by mouth once a day 9)  Norvasc 5 Mg Tabs (Amlodipine besylate) .... Take 1 tablet by mouth once a day 10)  Fish Oil 1000 Mg Caps (Omega-3 fatty acids) .... Take 1 capsule by mouth once a day  Hypertension Assessment/Plan:      The patient's hypertensive risk group is category C: Target  organ damage and/or diabetes.  His calculated 10 year risk of coronary heart disease is 7 %.  Today's blood pressure is 122/80.  His blood pressure goal is < 140/90.  Patient Instructions: 1)  Schedule retasure. 2)  Call in 4 weeks to schedule Flu Shot. 3)  Your A1C is 5.9.  This is great.  Keep up the good work! 4)  Please schedule a follow-up appointment in 4 months for blood pressure and diabetes.   Laboratory Results   Blood Tests     HGBA1C: 5.9%   (Normal Range: Non-Diabetic - 3-6%   Control Diabetic - 6-8%)

## 2010-09-23 NOTE — Assessment & Plan Note (Signed)
Summary: FU WITH Danny Woods PA-C//GK   Vital Signs:  Patient profile:   50 year old male Height:      66 inches Weight:      204 pounds BMI:     33.05 Temp:     98.0 degrees F oral Pulse rate:   92 / minute Pulse rhythm:   regular Resp:     20 per minute BP sitting:   150 / 101  (left arm) Cuff size:   large  Vitals Entered By: Armenia Shannon (December 12, 2009 2:36 PM) Is Patient Diabetic? No Pain Assessment Patient in pain? no       Does patient need assistance? Functional Status Self care Ambulation Normal   Primary Care Provider:  Tereso Newcomer PA-C   History of Present Illness: Here for left knee and great toe pain. Rec'd call from N. Artis, PT from [facility] today.  He has had increasing knee pain over the last week.  This was preceded by left great toe pain that started about 2 weeks ago.  It is very tender to touch.  He notes some ?fever and chills.  He drinks alcohol on occasion.  He has had this happen before and had an xray of this other foot in 2007.  He has occ pain in his great toe about every 3-4 months.  SInce his toe pain began, he has had some swelling and pain in his knee.  His knee pain had improved with PT initially.  He denies any injury.  He denies locking.  Did feel like his knee would slip out from underneath of him at times.    Problems Prior to Update: 1)  Gout, Unspecified  (ICD-274.9) 2)  Diabetes Mellitus, Type II  (ICD-250.00) 3)  Lumbago  (ICD-724.2) 4)  Tinea Versicolor  (ICD-111.0) 5)  Electrocardiogram, Abnormal  (ICD-794.31) 6)  Snoring  (ICD-786.09) 7)  Wrist Pain, Left  (ICD-719.43) 8)  Knee Pain, Bilateral  (ICD-719.46) 9)  Penile Lesion  (ICD-236.6) 10)  Other Abnormal Glucose  (ICD-790.29) 11)  Rotator Cuff Syndrome, Left  (ICD-726.10) 12)  Depression  (ICD-311) 13)  Std Screening  (ICD-V01.6) 14)  Viral Infection, Acute  (ICD-079.99) 15)  Erectile Dysfunction, Secondary To Medication  (ICD-607.84) 16)  Pterygium, Bilateral   (ICD-372.40) 17)  Dislocation, Recurrent, R Shoulder  (ICD-718.31) 18)  Hypertension  (ICD-401.9) 19)  Hyperlipidemia  (ICD-272.4) 20)  Allergic Rhinitis, Seasonal  (ICD-477.0)  Current Medications (verified): 1)  Hydrochlorothiazide 25 Mg  Tabs (Hydrochlorothiazide) .... Once Daily 2)  Diovan 320 Mg Tabs (Valsartan) .... Take 1 Tablet By Mouth Every Morning 3)  Crestor 10 Mg  Tabs (Rosuvastatin Calcium) .Marland Kitchen.. 1 Tab Po Daily 4)  Ibuprofen 800 Mg Tabs (Ibuprofen) .... Take 1 Tablet By Mouth Every 8 Hours 5)  Veramyst 27.5 Mcg/spray Susp (Fluticasone Furoate) .... Use 2 Sprays in Each Nostril Once Daily 6)  Allegra 180 Mg Tabs (Fexofenadine Hcl) .... Take 1 Tablet By Mouth Once A Day As Needed For Allergies 7)  Tessalon Perles 100 Mg Caps (Benzonatate) .... Take 1 Tablet By Mouth Three Times A Day As Needed For Cough 8)  Tramadol Hcl 50 Mg Tabs (Tramadol Hcl) .... Take 1 Tablet By Mouth Three Times A Day As Needed For Knee Pain 9)  Clonidine Hcl 0.2 Mg Tabs (Clonidine Hcl) .... Take 1 Tablet By Mouth Two Times A Day (Note Dose Increase) 10)  Selenium Sulfide 2.5 % Lotn (Selenium Sulfide) .... Apply Once Daily X 7 Days; Then  Apply Once A Month For 3 Months.  Allergies (verified): 1)  ! Penicillin 2)  ! * Enalapril 3)  ! * Pesticides  Physical Exam  General:  alert, well-developed, and well-nourished.   Head:  normocephalic and atraumatic.   Lungs:  normal breath sounds.   Heart:  normal rate and regular rhythm.   Msk:  left great toe swollen and warm + pain to light touch no crepitus  left knee: neg ant drawer neg lachmans neg mcmurray no eff Extremities:  no edema Neurologic:  alert & oriented X3 and cranial nerves II-XII intact.   Psych:  normally interactive.    Diabetes Management Exam:    Foot Exam (with socks and/or shoes not present):       Sensory-Monofilament:          Left foot: normal          Right foot: normal   Impression & Recommendations:  Problem # 1:   GOUT, UNSPECIFIED (ICD-274.9)  tx with indomethacin check uric acid level, cbc and xray consider allopurinol at f/u  His updated medication list for this problem includes:    Ibuprofen 800 Mg Tabs (Ibuprofen) .Marland Kitchen... Take 1 tablet by mouth every 8 hours    Indomethacin 25 Mg Caps (Indomethacin) .Marland Kitchen... 1-2 tabs by mouth three times a day as needed for pain  Orders: T-CBC w/Diff (60454-09811) T-Basic Metabolic Panel (91478-29562) T-Uric Acid (Blood) (13086-57846) Diagnostic X-Ray/Fluoroscopy (Diagnostic X-Ray/Flu)  Problem # 2:  KNEE PAIN, BILATERAL (ICD-719.46) suspect knee pain exacerbated by change in gait from gout will ask PT to extend treatment  His updated medication list for this problem includes:    Ibuprofen 800 Mg Tabs (Ibuprofen) .Marland Kitchen... Take 1 tablet by mouth every 8 hours    Tramadol Hcl 50 Mg Tabs (Tramadol hcl) .Marland Kitchen... Take 1 tablet by mouth three times a day as needed for knee pain    Indomethacin 25 Mg Caps (Indomethacin) .Marland Kitchen... 1-2 tabs by mouth three times a day as needed for pain  Problem # 3:  HYPERTENSION (ICD-401.9) hard to get him to keep appts BP may be up due to pain if still > goal in 2 weeks, add carvedilol/labetalol  His updated medication list for this problem includes:    Hydrochlorothiazide 25 Mg Tabs (Hydrochlorothiazide) ..... Once daily    Diovan 320 Mg Tabs (Valsartan) .Marland Kitchen... Take 1 tablet by mouth every morning    Clonidine Hcl 0.2 Mg Tabs (Clonidine hcl) .Marland Kitchen... Take 1 tablet by mouth two times a day (note dose increase)  Problem # 4:  DIABETES MELLITUS, TYPE II (ICD-250.00) has met with Susie has changed diet will see how he does with this low threshold to start metformin  His updated medication list for this problem includes:    Diovan 320 Mg Tabs (Valsartan) .Marland Kitchen... Take 1 tablet by mouth every morning  Complete Medication List: 1)  Hydrochlorothiazide 25 Mg Tabs (Hydrochlorothiazide) .... Once daily 2)  Diovan 320 Mg Tabs (Valsartan) .... Take  1 tablet by mouth every morning 3)  Crestor 10 Mg Tabs (Rosuvastatin calcium) .Marland Kitchen.. 1 tab po daily 4)  Ibuprofen 800 Mg Tabs (Ibuprofen) .... Take 1 tablet by mouth every 8 hours 5)  Veramyst 27.5 Mcg/spray Susp (Fluticasone furoate) .... Use 2 sprays in each nostril once daily 6)  Allegra 180 Mg Tabs (Fexofenadine hcl) .... Take 1 tablet by mouth once a day as needed for allergies 7)  Tessalon Perles 100 Mg Caps (Benzonatate) .... Take 1 tablet by mouth three  times a day as needed for cough 8)  Tramadol Hcl 50 Mg Tabs (Tramadol hcl) .... Take 1 tablet by mouth three times a day as needed for knee pain 9)  Clonidine Hcl 0.2 Mg Tabs (Clonidine hcl) .... Take 1 tablet by mouth two times a day (note dose increase) 10)  Selenium Sulfide 2.5 % Lotn (Selenium sulfide) .... Apply once daily x 7 days; then apply once a month for 3 months. 11)  Indomethacin 25 Mg Caps (Indomethacin) .Marland Kitchen.. 1-2 tabs by mouth three times a day as needed for pain  Patient Instructions: 1)  Take Indomethacin 1-2 tabs every 8 hours until pain is better.  Then, take as needed.  Make sure you take with food so it does not  bother your stomach.  Ibuprofen, advil, motrin, aleve is very similar to Indomethacin.  Do not take them together. 2)  Please schedule a follow-up appointment in 2 weeks with Lorin Picket for gout and diabetes and blood pressure.  Prescriptions: INDOMETHACIN 25 MG CAPS (INDOMETHACIN) 1-2 tabs by mouth three times a day as needed for pain  #90 x 1   Entered and Authorized by:   Tereso Newcomer PA-C   Signed by:   Tereso Newcomer PA-C on 12/12/2009   Method used:   Print then Give to Patient   RxID:   [id number]   Last LDL:                                                 102 (10/28/2009 10:05:00 PM)        Diabetic Foot Exam Last Podiatry Exam Date: 12/12/2009  Foot Inspection Is there a history of a foot ulcer?              No Is there a foot ulcer now?              No Can the patient see the bottom of  their feet?          Yes Are the shoes appropriate in style and fit?          Yes Is there swelling or an abnormal foot shape?          No Are the toenails long?                No Are the toenails thick?                No Are the toenails ingrown?              No Is there heavy callous build-up?              No Is there a claw toe deformity?                          No Is there elevated skin temperature?            No Is there limited ankle dorsiflexion?            No Is there foot or ankle muscle weakness?            No Do you have pain in calf while walking?           No      Diabetic Foot Care Education :Patient educated on appropriate care of diabetic feet.  10-g (5.07) Semmes-Weinstein Monofilament Test Performed by: Armenia Shannon          Right Foot          Left Foot Visual Inspection               Test Control      normal         normal Site 1         normal         normal Site 2         normal         normal Site 3         normal         normal Site 4         normal         normal Site 5         normal         normal Site 6         normal         normal Site 7         normal         normal Site 8         normal         normal Site 9         normal         normal Site 10         normal         normal  Impression      normal         normal

## 2010-09-23 NOTE — Letter (Signed)
Summary: [facility]  [facility]   Imported By: Arta Bruce 02/25/2010 11:41:37  _____________________________________________________________________  External Attachment:    Type:   Image     Comment:   External Document

## 2010-09-23 NOTE — Letter (Signed)
Summary: [facility] Instructions  [facility]  [address] [place], [place] [address]   Phone: [phone]  Fax: [phone]       Danny Vazquez    [birth year]    MRN: [id number]        Procedure Day Dorna Bloom:  Wednesday 04/09/2010     Arrival Time: 1:00 pm      Procedure Time: 2:00 pm     Location of Procedure:                    _x _   Endoscopy Center (4th Floor)                        PREPARATION FOR COLONOSCOPY WITH MOVIPREP   Starting 5 days prior to your procedure Friday 8/12 do not eat nuts, seeds, popcorn, corn, beans, peas,  salads, or any raw vegetables.  Do not take any fiber supplements (e.g. Metamucil, Citrucel, and Benefiber).  THE DAY BEFORE YOUR PROCEDURE         DATE: Tuesday 8/16  1.  Drink clear liquids the entire day-NO SOLID FOOD  2.  Do not drink anything colored red or purple.  Avoid juices with pulp.  No orange juice.  3.  Drink at least 64 oz. (8 glasses) of fluid/clear liquids during the day to prevent dehydration and help the prep work efficiently.  CLEAR LIQUIDS INCLUDE: Water Jello Ice Popsicles Tea (sugar ok, no milk/cream) Powdered fruit flavored drinks Coffee (sugar ok, no milk/cream) Gatorade Juice: apple, white grape, white cranberry  Lemonade Clear bullion, consomm, broth Carbonated beverages (any kind) Strained chicken noodle soup Hard Candy                             4.  In the morning, mix first dose of MoviPrep solution:    Empty 1 Pouch A and 1 Pouch B into the disposable container    Add lukewarm drinking water to the top line of the container. Mix to dissolve    Refrigerate (mixed solution should be used within 24 hrs)  5.  Begin drinking the prep at 5:00 p.m. The MoviPrep container is divided by 4 marks.   Every 15 minutes drink the solution down to the next mark (approximately 8 oz) until the full liter is complete.   6.  Follow completed prep with 16 oz of clear liquid of your choice (Nothing  red or purple).  Continue to drink clear liquids until bedtime.  7.  Before going to bed, mix second dose of MoviPrep solution:    Empty 1 Pouch A and 1 Pouch B into the disposable container    Add lukewarm drinking water to the top line of the container. Mix to dissolve    Refrigerate  THE DAY OF YOUR PROCEDURE      DATE: Wednesday 8/17  Beginning at 9:00 a.m. (5 hours before procedure):         1. Every 15 minutes, drink the solution down to the next mark (approx 8 oz) until the full liter is complete.  2. Follow completed prep with 16 oz. of clear liquid of your choice.    3. You may drink clear liquids until 12:00 pm (2 HOURS BEFORE PROCEDURE).   MEDICATION INSTRUCTIONS  Unless otherwise instructed, you should take regular prescription medications with a small sip of water   as early as possible the morning of  your procedure.   Additional medication instructions: Do not take HCTZ day of procedure.         OTHER INSTRUCTIONS  You will need a responsible adult at least 51 years of age to accompany you and drive you home.   This person must remain in the waiting room during your procedure.  Wear loose fitting clothing that is easily removed.  Leave jewelry and other valuables at home.  However, you may wish to bring a book to read or  an iPod/MP3 player to listen to music as you wait for your procedure to start.  Remove all body piercing jewelry and leave at home.  Total time from sign-in until discharge is approximately 2-3 hours.  You should go home directly after your procedure and rest.  You can resume normal activities the  day after your procedure.  The day of your procedure you should not:   Drive   Make legal decisions   Operate machinery   Drink alcohol   Return to work  You will receive specific instructions about eating, activities and medications before you leave.    The above instructions have been reviewed and explained to me by   Ezra Sites RN  March 26, 2010 1:33 PM    I fully understand and can verbalize these instructions _____________________________ Date _________

## 2010-09-23 NOTE — Letter (Signed)
Summary: *HSN Results Follow up  [facility]  [address] [place], [place] [address]   Phone: [phone]  Fax: [phone]      03/26/2010   DORIN STOOKS[place] [address] [place], [place]  [address]   Dear  Mr. LOTHAR PREHN,                            ____S.Drinkard,FNP   ____D. Gore,FNP       ____B. McPherson,MD   ____V. Rankins,MD    ____E. Mulberry,MD    ____N. Daphine Deutscher, FNP  ____D. Reche Dixon, MD    ____K. Philipp Deputy, MD    __x__S. Alben Spittle, PA-C     This letter is to inform you that your recent test(s):  Echocardiogram:  This showed that your heart muscle is thick from high blood pressure.  It is important that we keep your pressure down.  Your heart squeeze was good and strong.  Your valves looked normal.         _________________________________________________________ If you have any questions, please contact our office                     Sincerely,  Tereso Newcomer PA-C [facility]

## 2010-09-23 NOTE — Letter (Signed)
Summary: [facility]  [facility]   Imported By: Arta Bruce 05/20/2010 14:25:55  _____________________________________________________________________  External Attachment:    Type:   Image     Comment:   External Document

## 2010-09-23 NOTE — Miscellaneous (Signed)
Summary: [facility]  Clinical Lists Changes  Observations: Added new observation of ALLERGY REV: Done (03/26/2010 12:57)  Pt uses [facility].  Pt given moviprep while in Moviprep

## 2010-09-23 NOTE — Procedures (Signed)
Summary: Colonoscopy  Patient: [name] Note: All result statuses are Final unless otherwise noted.  Tests: (1) Colonoscopy (COL)   COL Colonoscopy           DONE     [facility]     [address]     [place], [place]  [address]           COLONOSCOPY PROCEDURE REPORT           PATIENT:  Danny Vazquez, Danny Vazquez  MR#:  [id number]     BIRTHDATE:  March 25, 1961, 49 yrs. old  GENDER:  male     ENDOSCOPIST:  Vania Rea. Jarold Motto, MD, [facility]     REF. BY:  Julieanne Manson, M.D.     PROCEDURE DATE:  04/09/2010     PROCEDURE:  Colonoscopy with snare polypectomy     ASA CLASS:  Class II     INDICATIONS:  Routine Risk Screening     MEDICATIONS:   Fentanyl 50 mcg IV, Versed 5 mg IV           DESCRIPTION OF PROCEDURE:   After the risks benefits and     alternatives of the procedure were thoroughly explained, informed     consent was obtained.  Digital rectal exam was performed and     revealed no abnormalities.   The LB [id number] endoscope     was introduced through the anus and advanced to the cecum, which     was identified by both the appendix and ileocecal valve, without     limitations.  The quality of the prep was good, using MoviPrep.     The instrument was then slowly withdrawn as the colon was fully     examined.     <<PROCEDUREIMAGES>>           FINDINGS:  A sessile polyp was found in the cecum. FLAT POLYP     COLD SNARE EXCISED.  A sessile polyp was found in the distal     transverse colon. FLAT POLYP COLD SNARE EXCISED.  This was     otherwise a normal examination of the colon.   Retroflexed views     in the rectum revealed no abnormalities.    The scope was then     withdrawn from the patient and the procedure completed.           COMPLICATIONS:  None     ENDOSCOPIC IMPRESSION:     1) Sessile polyp in the cecum     2) Sessile polyp in the distal transverse colon     3) Otherwise normal examination     R/O ADENOMAS.     RECOMMENDATIONS:     1) If the  polyp(s) removed today are proven to be adenomatous     (pre-cancerous) polyps, you will need a repeat colonoscopy in 5     years. Otherwise you should continue to follow colorectal cancer     screening guidelines for "routine risk" patients with colonoscopy     in 10 years.     REPEAT EXAM:  No           ______________________________     Vania Rea. Jarold Motto, MD, [facility]           CC:  Tereso Newcomer PA           n.     eSIGNED:   Vania Rea. Patterson at 04/09/2010 02:52 PM  [place], [place], [id number]  Note: An exclamation mark (!) indicates a result that was not dispersed into the flowsheet. Document Creation Date: 04/09/2010 2:54 PM _______________________________________________________________________  (1) Order result status: Final Collection or observation date-time: 04/09/2010 14:42 Requested date-time:  Receipt date-time:  Reported date-time:  Referring Physician:   Ordering Physician: Sheryn Bison [phone]) Specimen Source:  Source: Launa Grill Order Number: [id number] Lab site:   Appended Document: Colonoscopy     Procedures Next Due Date:    Colonoscopy: 04/2015  Appended Document: Colonoscopy     Procedures Next Due Date:    Colonoscopy: 03/2015  Appended Document: Colonoscopy five-year followup

## 2010-09-23 NOTE — Letter (Signed)
Summary: Patient Notice- Polyp Results  [name]  [address] [place], [place] [address]   Phone: [phone]  Fax: [phone]        April 11, 2010 MRN: [id number]    [name] [address] [place], [place]  [address]    Dear Mr. CHUBA,  I am pleased to inform you that the colon polyp(s) removed during your recent colonoscopy was (were) found to be benign (no cancer detected) upon pathologic examination.  I recommend you have a repeat colonoscopy examination in 5_ years to look for recurrent polyps, as having colon polyps increases your risk for having recurrent polyps or even colon cancer in the future.  Should you develop new or worsening symptoms of abdominal pain, bowel habit changes or bleeding from the rectum or bowels, please schedule an evaluation with either your primary care physician or with me.  Additional information/recommendations:  _X_ No further action with gastroenterology is needed at this time. Please      follow-up with your primary care physician for your other healthcare      needs.  __ Please call [phone] to schedule a return visit to review your      situation.  __ Please keep your follow-up visit as already scheduled.  __ Continue treatment plan as outlined the day of your exam.  Please call us if you are having persistent problems or have questions about your condition that have not been fully answered at this time.  Sincerely,  Mardella Layman MD [facility]  This letter has been electronically signed by your physician.  Appended Document: Patient Notice- Polyp Results letter mailed

## 2010-09-23 NOTE — Letter (Signed)
Summary: REFERRAL//PHYSICAL THERAPY  REFERRAL//PHYSICAL THERAPY   Imported By: Arta Bruce 12/25/2009 12:31:25  _____________________________________________________________________  External Attachment:    Type:   Image     Comment:   External Document

## 2010-09-23 NOTE — Letter (Signed)
Summary: Patient Notice- Polyp Results  [facility]  [address] [place], [place] [address]   Phone: [phone]  Fax: [phone]        April 18, 2010 MRN: [id number]    Danny Vazquez [address] [place], [place]  [address]    Dear Mr. [name],  I am pleased to inform you that the colon polyp(s) removed during your recent colonoscopy was (were) found to be benign (no cancer detected) upon pathologic examination.  I recommend you have a repeat colonoscopy examination in 5_ years to look for recurrent polyps, as having colon polyps increases your risk for having recurrent polyps or even colon cancer in the future.  Should you develop new or worsening symptoms of abdominal pain, bowel habit changes or bleeding from the rectum or bowels, please schedule an evaluation with either your primary care physician or with me.  Additional information/recommendations:  _x_ No further action with gastroenterology is needed at this time. Please      follow-up with your primary care physician for your other healthcare      needs.  __ Please call [phone] to schedule a return visit to review your      situation.  __ Please keep your follow-up visit as already scheduled.  __ Continue treatment plan as outlined the day of your exam.  Please call us if you are having persistent problems or have questions about your condition that have not been fully answered at this time.  Sincerely,  Mardella Layman MD [facility]  This letter has been electronically signed by your physician.

## 2010-09-23 NOTE — Miscellaneous (Signed)
Summary: Rehab Report//PROGRESS NOTE//FAXED  Rehab Report//PROGRESS NOTE//FAXED   Imported By: Arta Bruce 12/12/2009 15:51:33  _____________________________________________________________________  External Attachment:    Type:   Image     Comment:   External Document

## 2010-09-23 NOTE — Assessment & Plan Note (Signed)
Summary: CPE////RJP   Vital Signs:  Patient profile:   50 year old male Height:      66 inches Weight:      204 pounds BMI:     33.05 Temp:     97.8 degrees F oral Pulse rate:   77 / minute Pulse rhythm:   regular Resp:     18 per minute BP sitting:   155 / 93  (left arm)  Vitals Entered By: [place] Shannon (February 10, 2010 8:58 AM) CC: cpe..., Hypertension Management Is Patient Diabetic? No Pain Assessment Patient in pain? no      CBG Result 111  Does patient need assistance? Functional Status Self care Ambulation Normal   Primary Care Provider:  Tereso Newcomer PA-C  CC:  cpe... and Hypertension Management.  History of Present Illness: Here for CPE. Health maint: Pneumovax done 2010. Td done 2009. Discussed colo and he would like to get. Discussed PSA and he would like to get. PHQ9=2.  Answers 1 to ques. 2.  Used to take Zoloft.  Does not want meds now.  Feels ok.  Denies SI.   Abnormal EKG:  ?LVH with strain.  Was to have Echo. Never had done.  Snoring:  Just had sleep study done.  Sounds like he has sleep apnea.  No recors available to me yet.  Penile lesion:  States he lesion on penis eval at derm clinic and told it was benign.  Gout:  Taking allopurinol now.  NO more pain in toe.    Hypertension History:      He denies headache, chest pain, dyspnea with exertion, and syncope.  Further comments include: Not taking Norvasc yet. Marland Kitchen        Positive major cardiovascular risk factors include male age 36 years old or older, diabetes, hyperlipidemia, and hypertension.  Negative major cardiovascular risk factors include negative family history for ischemic heart disease and non-tobacco-user status.     Habits & Providers  Alcohol-Tobacco-Diet     Tobacco Status: never  Exercise-Depression-Behavior     Drug Use: no  Problems Prior to Update: 1)  Urinalysis, Abnormal  (ICD-791.9) 2)  Preventive Health Care  (ICD-V70.0) 3)  Pharyngitis  (ICD-462) 4)  Gout,  Unspecified  (ICD-274.9) 5)  Diabetes Mellitus, Type II  (ICD-250.00) 6)  Lumbago  (ICD-724.2) 7)  Tinea Versicolor  (ICD-111.0) 8)  Electrocardiogram, Abnormal  (ICD-794.31) 9)  Snoring  (ICD-786.09) 10)  Wrist Pain, Left  (ICD-719.43) 11)  Knee Pain, Bilateral  (ICD-719.46) 12)  Penile Lesion  (ICD-236.6) 13)  Other Abnormal Glucose  (ICD-790.29) 14)  Rotator Cuff Syndrome, Left  (ICD-726.10) 15)  Depression  (ICD-311) 16)  Std Screening  (ICD-V01.6) 17)  Viral Infection, Acute  (ICD-079.99) 18)  Erectile Dysfunction, Secondary To Medication  (ICD-607.84) 19)  Pterygium, Bilateral  (ICD-372.40) 20)  Dislocation, Recurrent, R Shoulder  (ICD-718.31) 21)  Hypertension  (ICD-401.9) 22)  Hyperlipidemia  (ICD-272.4) 23)  Allergic Rhinitis, Seasonal  (ICD-477.0)  Current Medications (verified): 1)  Hydrochlorothiazide 25 Mg  Tabs (Hydrochlorothiazide) .... Once Daily 2)  Diovan 320 Mg Tabs (Valsartan) .... Take 1 Tablet By Mouth Every Morning 3)  Crestor 10 Mg  Tabs (Rosuvastatin Calcium) .Marland Kitchen.. 1 Tab Po Daily 4)  Ibuprofen 800 Mg Tabs (Ibuprofen) .... Hold - Started On Indomethacin For Gout 5)  Veramyst 27.5 Mcg/spray Susp (Fluticasone Furoate) .... Use 2 Sprays in Each Nostril Once Daily 6)  Allegra 180 Mg Tabs (Fexofenadine Hcl) .... Take 1 Tablet By Mouth Once A  Day As Needed For Allergies 7)  Tessalon Perles 100 Mg Caps (Benzonatate) .... Take 1 Tablet By Mouth Three Times A Day As Needed For Cough 8)  Tramadol Hcl 50 Mg Tabs (Tramadol Hcl) .... Take 1 Tablet By Mouth Three Times A Day As Needed For Knee Pain 9)  Clonidine Hcl 0.2 Mg Tabs (Clonidine Hcl) .... Take 1 Tablet By Mouth Two Times A Day (Note Dose Increase) 10)  Selenium Sulfide 2.5 % Lotn (Selenium Sulfide) .... Apply Once Daily X 7 Days; Then Apply Once A Month For 3 Months. 11)  Indomethacin 25 Mg Caps (Indomethacin) .Marland Kitchen.. 1-2 Tabs By Mouth Three Times A Day As Needed For Pain 12)  Allopurinol 300 Mg Tabs (Allopurinol)  .... Take 1 Tablet By Mouth Once A Day 13)  Norvasc 5 Mg Tabs (Amlodipine Besylate) .... Take 1 Tablet By Mouth Once A Day 14)  Zithromax Z-Pak 250 Mg Tabs (Azithromycin) .... Take As Directed  Allergies (verified): 1)  ! Penicillin 2)  ! * Enalapril 3)  ! * Pesticides  Past History:  Past Medical History: ERECTILE DYSFUNCTION, SECONDARY TO MEDICATION (ICD-607.84) PTERYGIUM, BILATERAL (ICD-372.40) DISLOCATION, RECURRENT, R SHOULDER (ICD-718.31) HYPERTENSION (ICD-401.9) HYPERLIPIDEMIA (ICD-272.4) ALLERGIC RHINITIS, SEASONAL (ICD-477.0) Sleep Apnea (AHI 20.6 per hour - dx 01/2010)  Family History: Father, died age 36, CAD, CHF, htn Mother ,109, DM, Htn 4 Sisters healthy 8 Brothers, healthy 4 children, ages 6,11,17,19--all healthy No colon or prostate cancer  Social History: Originally from [place] Multilingual:  self-employed Surveyor, minerals for translation at [employer] and wife still in [place], [place] Never Smoked Alcohol use-yes  a. occasional Drug use-no Smoking Status:  never  Review of Systems      See HPI General:  Denies chills and fever. CV:  Denies chest pain or discomfort, fainting, and shortness of breath with exertion. Resp:  Denies cough. GI:  Denies bloody stools and dark tarry stools. GU:  Denies dysuria and nocturia. MS:  Denies joint pain. Neuro:  Denies headaches. Psych:  Denies depression and suicidal thoughts/plans.  Physical Exam  General:  alert, well-developed, and well-nourished.   Head:  normocephalic and atraumatic.   Eyes:  pupils equal, pupils round, pupils reactive to light, and no optic disk abnormalities.   Ears:  R ear normal and L ear normal.   Nose:  no external deformity.   Mouth:  pharynx pink and moist.   Neck:  supple, no thyromegaly, and no cervical lymphadenopathy.   Chest Wall:  no deformities.   Breasts:  no gynecomastia.   Lungs:  normal breath sounds, no crackles, and no wheezes.   Heart:  normal rate, regular  rhythm, and no murmur.   Abdomen:  soft, non-tender, normal bowel sounds, and no hepatomegaly.   Rectal:  no external abnormalities, no hemorrhoids, normal sphincter tone, no masses, no tenderness, no fissures, no fistulae, and no perianal rash.   Genitalia:  circumcised, no hydrocele, no varicocele, no scrotal masses, no testicular masses or atrophy, no cutaneous lesions, and no urethral discharge.   Prostate:  no gland enlargement, no nodules, no asymmetry, and no induration.   Msk:  normal ROM.   Pulses:  R posterior tibial normal, R dorsalis pedis normal, L posterior tibial normal, and L dorsalis pedis normal.   Extremities:  no edema  Neurologic:  alert & oriented X3, cranial nerves II-XII intact, and gait normal.   Skin:  turgor normal.   Psych:  normally interactive and good eye contact.     Impression & Recommendations:  Problem # 1:  ELECTROCARDIOGRAM, ABNORMAL (ICD-794.31) schedule echo again  Problem # 2:  URINALYSIS, ABNORMAL (ICD-791.9)  Orders: T-Culture, Urine (29562-13086) T- * Misc. Laboratory test [phone])  Problem # 3:  GOUT, UNSPECIFIED (ICD-274.9)  continue Allopurinol  The following medications were removed from the medication list:    Ibuprofen 800 Mg Tabs (Ibuprofen) ..... Hold - started on indomethacin for gout His updated medication list for this problem includes:    Indomethacin 25 Mg Caps (Indomethacin) .Marland Kitchen... 1-2 tabs by mouth three times a day as needed for pain    Allopurinol 300 Mg Tabs (Allopurinol) .Marland Kitchen... Take 1 tablet by mouth once a day  Orders: T-Uric Acid (Blood) (96295-28413)  Problem # 4:  HYPERTENSION (ICD-401.9) still not taking norvasc advised him to start check BP with nurse in 1 month f/u with me in 3 months  His updated medication list for this problem includes:    Hydrochlorothiazide 25 Mg Tabs (Hydrochlorothiazide) ..... Once daily    Diovan 320 Mg Tabs (Valsartan) .Marland Kitchen... Take 1 tablet by mouth every morning    Clonidine Hcl  0.2 Mg Tabs (Clonidine hcl) .Marland Kitchen... Take 1 tablet by mouth two times a day (note dose increase)    Norvasc 5 Mg Tabs (Amlodipine besylate) .Marland Kitchen... Take 1 tablet by mouth once a day  Orders: T-TSH (24401-02725) T-Urinalysis (36644-03474)  Problem # 5:  DIABETES MELLITUS, TYPE II (ICD-250.00) Assessment: Improved A1C now 6.0! looks much better continue diet control  His updated medication list for this problem includes:    Diovan 320 Mg Tabs (Valsartan) .Marland Kitchen... Take 1 tablet by mouth every morning  Orders: Capillary Blood Glucose/CBG (25956) Hemoglobin A1C (83036) T-Lipid Profile (38756-43329) T-Comprehensive Metabolic Panel (51884-16606)  Problem # 6:  HYPERLIPIDEMIA (ICD-272.4)  His updated medication list for this problem includes:    Crestor 10 Mg Tabs (Rosuvastatin calcium) .Marland Kitchen... 1 tab po daily  Orders: T-Lipid Profile (30160-10932)  Problem # 7:  PENILE LESION (ICD-236.6) apparently benign after eval at derm clinic  Problem # 8:  PREVENTIVE HEALTH CARE (ICD-V70.0)  Orders: T-Lipid Profile (35573-22025) T-TSH (42706-23762) T-Hemoccult Cards-Multiple (82270) T-PSA (83151-76160) Gastroenterology Referral (GI)  Problem # 9:  SLEEP APNEA (ICD-780.57)  rec'd records after patient left office schedule cpap titration  Complete Medication List: 1)  Hydrochlorothiazide 25 Mg Tabs (Hydrochlorothiazide) .... Once daily 2)  Diovan 320 Mg Tabs (Valsartan) .... Take 1 tablet by mouth every morning 3)  Crestor 10 Mg Tabs (Rosuvastatin calcium) .Marland Kitchen.. 1 tab po daily 4)  Allegra 180 Mg Tabs (Fexofenadine hcl) .... Take 1 tablet by mouth once a day as needed for allergies 5)  Tramadol Hcl 50 Mg Tabs (Tramadol hcl) .... Take 1 tablet by mouth three times a day as needed for knee pain 6)  Clonidine Hcl 0.2 Mg Tabs (Clonidine hcl) .... Take 1 tablet by mouth two times a day (note dose increase) 7)  Indomethacin 25 Mg Caps (Indomethacin) .Marland Kitchen.. 1-2 tabs by mouth three times a day as needed  for pain 8)  Allopurinol 300 Mg Tabs (Allopurinol) .... Take 1 tablet by mouth once a day 9)  Norvasc 5 Mg Tabs (Amlodipine besylate) .... Take 1 tablet by mouth once a day  Other Orders: CPAP/BIPAP (CPAP/BIPAP)  Hypertension Assessment/Plan:      The patient's hypertensive risk group is category C: Target organ damage and/or diabetes.  His calculated 10 year risk of coronary heart disease is 18 %.  Today's blood pressure is 155/93.  His blood pressure goal is < 140/90.  Patient Instructions: 1)  Go ahead and start taking the Norvasc. 2)  Do not stop any of your blood pressure medicines. 3)  Your goal blood pressure is less than 130/80. 4)  Schedule retasure at [facility]. 5)  Your A1C is 6.0!  That's great.  Your diabetes is well controlled.  Keep up the good work. 6)  Someone will contact you to see Dr. Corinda Gubler for a colonoscopy. 7)  Schedule visit with nurse for blood pressure check in 1 month. 8)  Please schedule a follow-up appointment in 3 months with Garland Hincapie for diabetes and blood pressure.  9)  Your medicines listed below should be correct now. Prescriptions: NORVASC 5 MG TABS (AMLODIPINE BESYLATE) Take 1 tablet by mouth once a day  #30 x 5   Entered and Authorized by:   Tereso Newcomer PA-C   Signed by:   Tereso Newcomer PA-C on 02/10/2010   Method used:   Print then Give to Patient   RxID:   [id number]   Laboratory Results   Urine Tests  Date/Time Received: February 10, 2010 9:06 AM   Routine Urinalysis   Glucose: negative   (Normal Range: Negative) Bilirubin: negative   (Normal Range: Negative) Ketone: negative   (Normal Range: Negative) Spec. Gravity: >=1.030   (Normal Range: 1.003-1.035) Blood: trace-intact   (Normal Range: Negative) pH: 5.5   (Normal Range: 5.0-8.0) Protein: negative   (Normal Range: Negative) Urobilinogen: 0.2   (Normal Range: 0-1) Nitrite: negative   (Normal Range: Negative) Leukocyte Esterace: negative   (Normal Range: Negative)     Blood  Tests     HGBA1C: 6.0%   (Normal Range: Non-Diabetic - 3-6%   Control Diabetic - 6-8%) CBG Random:: 111mg /dL

## 2010-09-23 NOTE — Letter (Signed)
Summary: *HSN Results Follow up  [facility]  [address] [place], [place] [address]   Phone: [phone]  Fax: [phone]      05/16/2010   CASWELL ALVILLAR [address] [place], [place]  [address]   Dear  Mr. DAL BLEW,                            ____S.Drinkard,FNP   ____D. Gore,FNP       ____B. McPherson,MD   ____V. Rankins,MD    ____E. Mulberry,MD    ____N. Daphine Deutscher, FNP  ____D. Reche Dixon, MD    ____K. Philipp Deputy, MD    _x___S. Alben Spittle, PA-C     This letter is to inform you that your recent test(s):  _______Pap Smear    _______Lab Test     _______X-ray    _______ is within acceptable limits  _______ requires a medication change  _______ requires a follow-up lab visit  _______ requires a follow-up visit with your Caela Huot   Comments: Eye test was normal.       _________________________________________________________ If you have any questions, please contact our office                     Sincerely,  Tereso Newcomer PA-C [facility]

## 2010-09-23 NOTE — Letter (Signed)
Summary: *HSN Results Follow up  [facility]  [address] [place], [place] [address]   Phone: [phone]  Fax: [phone]      02/12/2010   Danny Vazquez [address] [place], [place]  [address]   Dear  Danny Vazquez,                            ____S.Drinkard,FNP   ____D. Gore,FNP       ____B. McPherson,MD   ____V. Rankins,MD    ____E. Mulberry,MD    ____N. Daphine Deutscher, FNP  ____D. Reche Dixon, MD    ____K. Philipp Deputy, MD    __x__S. Alben Spittle, PA-C    This letter is to inform you that your recent test(s):  _______Pap Smear    ___x____Lab Test     _______X-ray    ___x____ is within acceptable limits  ___x____ requires a medication change  ___x____ requires a follow-up lab visit  _______ requires a follow-up visit with your provider   Comments:  Your liver and kidney profile was normal.  Your thyroid test was normal.  Your prostate (PSA) test was normal.  The uric acid level was 6.4.  We could try to get that below 6, but since you are doing well, keep taking the same dose of Allopurinol.  Your cholesterol is good except your HDL (good cholesterol) was 38.  It should be above 40.  Start taking Fish Oil 1000 mg 1 cap by mouth once daily.  You can get it over the counter.  Schedule a follow up fasting lipid panel and liver function test in 6 months.        _________________________________________________________ If you have any questions, please contact our office                     Sincerely,  Tereso Newcomer PA-C [facility]

## 2010-09-23 NOTE — Miscellaneous (Signed)
  Clinical Lists Changes  Problems: Added new problem of COLONIC POLYPS, ADENOMATOUS, BENIGN (ICD-569.0) Observations: Added new observation of PAST MED HX: ERECTILE DYSFUNCTION, SECONDARY TO MEDICATION (ICD-607.84) PTERYGIUM, BILATERAL (ICD-372.40) DISLOCATION, RECURRENT, R SHOULDER (ICD-718.31) HYPERTENSION (ICD-401.9) HYPERLIPIDEMIA (ICD-272.4) ALLERGIC RHINITIS, SEASONAL (ICD-477.0) Sleep Apnea (AHI 20.6 per hour - dx 01/2010) Echo 02/2010:  Mod to Severe LVH; EF 65-70%; Grade 2 diastolic dysfxn; mild LAE Colo 8.17.2011:  polyps; path:  tubular adenomas, neg for high grade dysplasia or malignancy    a.  repeat colo 2016   (04/21/2010 21:46)       Past History:  Past Medical History: ERECTILE DYSFUNCTION, SECONDARY TO MEDICATION (ICD-607.84) PTERYGIUM, BILATERAL (ICD-372.40) DISLOCATION, RECURRENT, R SHOULDER (ICD-718.31) HYPERTENSION (ICD-401.9) HYPERLIPIDEMIA (ICD-272.4) ALLERGIC RHINITIS, SEASONAL (ICD-477.0) Sleep Apnea (AHI 20.6 per hour - dx 01/2010) Echo 02/2010:  Mod to Severe LVH; EF 65-70%; Grade 2 diastolic dysfxn; mild LAE Colo 8.17.2011:  polyps; path:  tubular adenomas, neg for high grade dysplasia or malignancy    a.  repeat colo 2016

## 2010-09-23 NOTE — Progress Notes (Signed)
Summary: Office Visit//DEPRESSION SCREENING  Office Visit//DEPRESSION SCREENING   Imported By: Arta Bruce 03/05/2010 15:29:47  _____________________________________________________________________  External Attachment:    Type:   Image     Comment:   External Document

## 2010-09-23 NOTE — Progress Notes (Signed)
  Phone Note Call from Patient   Summary of Call: pt needs refill on diovan called into [facility], forward to provider Initial call taken by: [place] Shannon,  October 15, 2009 12:39 PM    Prescriptions: DIOVAN 320 MG TABS (VALSARTAN) Take 1 tablet by mouth every morning  #30 x 5   Entered and Authorized by:   Tereso Newcomer PA-C   Signed by:   Tereso Newcomer PA-C on 10/15/2009   Method used:   Faxed to ...       [facility] (retail)       [address] [place], [place]  [address]       Ph: [phone]       Fax: [phone]   RxID:   [id number]

## 2010-09-23 NOTE — Letter (Signed)
Summary: TEST ORDER FORM/CPAP/BIPAP  TEST ORDER FORM/CPAP/BIPAP   Imported By: Arta Bruce 03/10/2010 11:57:42  _____________________________________________________________________  External Attachment:    Type:   Image     Comment:   External Document

## 2010-09-23 NOTE — Miscellaneous (Signed)
  Clinical Lists Changes  Observations: Added new observation of DIAB EYE EX: normal retasure (05/16/2010 16:13)  Appended Document:     Clinical Lists Changes  Observations: Changed observation from DIAB EYE EX: normal retasure (05/16/2010 16:13) to DIAB EYE EX: normal retasure (01/07/2010 16:13)

## 2010-09-23 NOTE — Letter (Signed)
Summary: TRANSTHORACIC ECHOCARDIOGRAPY  TRANSTHORACIC ECHOCARDIOGRAPY   Imported By: Arta Bruce 04/09/2010 11:56:52  _____________________________________________________________________  External Attachment:    Type:   Image     Comment:   External Document

## 2010-09-23 NOTE — Miscellaneous (Signed)
Summary: DISCHARGE SUMMARY  DISCHARGE SUMMARY   Imported By: Arta Bruce 02/03/2010 12:41:59  _____________________________________________________________________  External Attachment:    Type:   Image     Comment:   External Document

## 2010-09-23 NOTE — Assessment & Plan Note (Signed)
Summary: renew medication/ knee pain//gk   Vital Signs:  Patient profile:   50 year old male Weight:      211 pounds Temp:     98.2 degrees F Pulse rhythm:   regular Resp:     18 per minute BP sitting:   150 / 99  (left arm) Cuff size:   large  Vitals Entered By: Vesta Mixer CMA (October 28, 2009 8:50 AM) CC: f/u on knee pain and spots on back and wants all STD test done., Hypertension Management Is Patient Diabetic? No Pain Assessment Patient in pain? no       Does patient need assistance? Ambulation Normal   Primary Care Provider:  Tereso Newcomer PA-C  CC:  f/u on knee pain and spots on back and wants all STD test done. and Hypertension Management.  History of Present Illness: Here for f/u. Have not seen in a while. He is compliant with meds.  BP still up.  Had to change norvasc due to concerns for ED. He is now on clonidine. Reports h/o snoring.  Also, notes witnessed apneic episode in past.  Mild daytime hypersomnolence.  Requesting tests for STDs.  He has been sexually active with another woman.  She is having discharge.  He denies any burning or penile discharge.  No lesions on penis.    Of note, lesion on penis eval months ago has not changed.  He never saw the derm clinic.  Not sure why he didn't call us.  Says he never got appt.  Will go ahead and reschedule.  Complaining of low back pain.  No radicular symptoms.  Worse when he is doing bending and stooping movements.  Took ibuprofen with relief.     Hypertension History:      He denies headache, chest pain, palpitations, dyspnea with exertion, orthopnea, peripheral edema, and syncope.  He notes no problems with any antihypertensive medication side effects.        Positive major cardiovascular risk factors include male age 4 years old or older, hyperlipidemia, and hypertension.  Negative major cardiovascular risk factors include negative family history for ischemic heart disease and non-tobacco-user status.      Allergies (verified): 1)  ! Penicillin 2)  ! * Enalapril 3)  ! * Pesticides  Physical Exam  General:  alert, well-developed, and well-nourished.   Head:  normocephalic and atraumatic.   Eyes:  no retinal abnormalitiies.   Neck:  no jvd  Lungs:  normal breath sounds, no crackles, and no wheezes.   Heart:  normal rate and regular rhythm.   Abdomen:  soft, non-tender, normal bowel sounds, and no hepatomegaly.   Genitalia:  area of lightened pigment again noted just prox to corona  no other lesions noted no d/c  Msk:  neg SLR bilat no spinal tend to palp  Neurologic:  alert & oriented X3, cranial nerves II-XII intact, strength normal in all extremities, and DTRs symmetrical and normal.   Skin:  areas of lightened pigmentation on back c/w tinea versicolor  Psych:  normally interactive.     Impression & Recommendations:  Problem # 1:  SNORING (ICD-786.09)  has some mild daytime hypersomnolence BP hard to control snoring is noted ? h/o apneic episodes in past (6 years ago wife told him that he was struggling to breath) will set up sleep study to r/o sleep apnea  His updated medication list for this problem includes:    Hydrochlorothiazide 25 Mg Tabs (Hydrochlorothiazide) ..... Once daily  Orders:  Sleep Study Other (Sleep Study Other)  Problem # 2:  HYPERTENSION (ICD-401.9)  uncontrolled watching salt no ETOH no OTC meds  increase clonidine to 0.2 two times a day f/u with me in one month  His updated medication list for this problem includes:    Hydrochlorothiazide 25 Mg Tabs (Hydrochlorothiazide) ..... Once daily    Diovan 320 Mg Tabs (Valsartan) .Marland Kitchen... Take 1 tablet by mouth every morning    Clonidine Hcl 0.2 Mg Tabs (Clonidine hcl) .Marland Kitchen... Take 1 tablet by mouth two times a day (note dose increase)  Orders: T-Comprehensive Metabolic Panel ([id number])  Problem # 3:  PENILE LESION (ICD-236.6) never saw derm clinic  reschedule appt  Problem # 4:   ELECTROCARDIOGRAM, ABNORMAL (ICD-794.31)  prob LVH with strain no reports of CP or DOE get Echo to assess for LVH  Orders: 2 D Echo (2 D Echo)  Problem # 5:  TINEA VERSICOLOR (ICD-111.0) selenium sulfide  Problem # 6:  STD SCREENING (ICD-V01.6)  Orders: T-GC Probe, urine ([id number]) T-Chlamydia  Probe, urine ([id number]) T-HIV Antibody  (Reflex) ([id number]) T-Syphilis Test (RPR) (38756-43329)  Problem # 7:  Preventive Health Care (ICD-V70.0) notes Td up to date schedule CPE in 01/2010  Problem # 8:  HYPERLIPIDEMIA (ICD-272.4) check lipids  His updated medication list for this problem includes:    Crestor 10 Mg Tabs (Rosuvastatin calcium) .Marland Kitchen... 1 tab po daily  Orders: T-Comprehensive Metabolic Panel [phone]) T-Lipid Profile (30160-10932)  Problem # 9:  KNEE PAIN, BILATERAL (ICD-719.46)  mild DJD on xrays refer to PT  His updated medication list for this problem includes:    Ibuprofen 800 Mg Tabs (Ibuprofen) .Marland Kitchen... Take 1 tablet by mouth every 8 hours    Tramadol Hcl 50 Mg Tabs (Tramadol hcl) .Marland Kitchen... Take 1 tablet by mouth three times a day as needed for knee pain  Orders: Physical Therapy Referral (PT)  Problem # 10:  LUMBAGO (ICD-724.2)  mechanical pain refer to PT  His updated medication list for this problem includes:    Ibuprofen 800 Mg Tabs (Ibuprofen) .Marland Kitchen... Take 1 tablet by mouth every 8 hours    Tramadol Hcl 50 Mg Tabs (Tramadol hcl) .Marland Kitchen... Take 1 tablet by mouth three times a day as needed for knee pain  Orders: Physical Therapy Referral (PT)  Complete Medication List: 1)  Hydrochlorothiazide 25 Mg Tabs (Hydrochlorothiazide) .... Once daily 2)  Diovan 320 Mg Tabs (Valsartan) .... Take 1 tablet by mouth every morning 3)  Crestor 10 Mg Tabs (Rosuvastatin calcium) .Marland Kitchen.. 1 tab po daily 4)  Ibuprofen 800 Mg Tabs (Ibuprofen) .... Take 1 tablet by mouth every 8 hours 5)  Veramyst 27.5 Mcg/spray Susp (Fluticasone furoate) .... Use 2 sprays in each  nostril once daily 6)  Allegra 180 Mg Tabs (Fexofenadine hcl) .... Take 1 tablet by mouth once a day as needed for allergies 7)  Tessalon Perles 100 Mg Caps (Benzonatate) .... Take 1 tablet by mouth three times a day as needed for cough 8)  Tramadol Hcl 50 Mg Tabs (Tramadol hcl) .... Take 1 tablet by mouth three times a day as needed for knee pain 9)  Clonidine Hcl 0.2 Mg Tabs (Clonidine hcl) .... Take 1 tablet by mouth two times a day (note dose increase) 10)  Selenium Sulfide 2.5 % Lotn (Selenium sulfide) .... Apply once daily x 7 days; then apply once a month for 3 months.  Hypertension Assessment/Plan:      The patient's hypertensive risk group is category B: At least one  risk factor (excluding diabetes) with no target organ damage.  His calculated 10 year risk of coronary heart disease is 6 %.  Today's blood pressure is 150/99.  His blood pressure goal is < 140/90.  Patient Instructions: 1)  Please schedule a follow-up appointment in 1 month with Ulyana Pitones for blood pressure. 2)  Schedule appt with Derm Clinic at [facility]. 3)  Schedule CPE with Lorin Picket in June 2011. 4)  Increase your clonidine to 0.2 mg two times a day. Prescriptions: SELENIUM SULFIDE 2.5 % LOTN (SELENIUM SULFIDE) Apply once daily x 7 days; then apply once a month for 3 months.  #1 bottle x 2   Entered and Authorized by:   Tereso Newcomer PA-C   Signed by:   Tereso Newcomer PA-C on 10/28/2009   Method used:   Faxed to ...       [facility] - [facility] (retail)       [address] [place], [place]  [address]       Ph: [phone]       Fax: [phone]   RxID:   [id number] IBUPROFEN 800 MG TABS (IBUPROFEN) Take 1 tablet by mouth every 8 hours  #60 x 1   Entered and Authorized by:   Tereso Newcomer PA-C   Signed by:   Tereso Newcomer PA-C on 10/28/2009   Method used:   Faxed to ...       [facility] - Pharmac (retail)       [address] [place], [place]   [address]       Ph: [phone]       Fax: [phone]   RxID:   [id number] CLONIDINE HCL 0.2 MG TABS (CLONIDINE HCL) Take 1 tablet by mouth two times a day (note dose increase)  #60 x 5   Entered and Authorized by:   Tereso Newcomer PA-C   Signed by:   Tereso Newcomer PA-C on 10/28/2009   Method used:   Faxed to ...       [facility] - Pharmac (retail)       [address] [place], [place]  [address]       Ph: [phone]       Fax: [phone]   RxID:   [id number]    EKG  Procedure date:  10/28/2009  Findings:      Normal sinus rhythm with rate of:  80 normal axis LVH TW inversions in V5-6 - ? LVH with strain   Laboratory Results    Other Tests  Rapid HIV: negative

## 2012-05-20 ENCOUNTER — Ambulatory Visit
Admission: RE | Admit: 2012-05-20 | Discharge: 2012-05-20 | Disposition: A | Discharge status: Home / Self Care | Payer: 59 | Source: Ambulatory Visit | Attending: Specialist | Admitting: Specialist

## 2012-05-20 ENCOUNTER — Other Ambulatory Visit: Payer: Self-pay | Admitting: Specialist

## 2012-05-20 DIAGNOSIS — R7611 Nonspecific reaction to tuberculin skin test without active tuberculosis: Secondary | ICD-10-CM

## 2012-09-26 ENCOUNTER — Ambulatory Visit (INDEPENDENT_AMBULATORY_CARE_PROVIDER_SITE_OTHER): Payer: 59 | Admitting: Emergency Medicine

## 2012-09-26 ENCOUNTER — Encounter: Payer: Self-pay | Admitting: Emergency Medicine

## 2012-09-26 ENCOUNTER — Telehealth: Payer: Self-pay | Admitting: Radiology

## 2012-09-26 VITALS — BP 138/80 | HR 84 | Temp 98.3°F | Resp 16 | Ht 66.5 in | Wt 206.0 lb

## 2012-09-26 DIAGNOSIS — J029 Acute pharyngitis, unspecified: Secondary | ICD-10-CM

## 2012-09-26 DIAGNOSIS — E119 Type 2 diabetes mellitus without complications: Secondary | ICD-10-CM

## 2012-09-26 DIAGNOSIS — E7800 Pure hypercholesterolemia, unspecified: Secondary | ICD-10-CM

## 2012-09-26 DIAGNOSIS — Z0000 Encounter for general adult medical examination without abnormal findings: Secondary | ICD-10-CM

## 2012-09-26 DIAGNOSIS — I10 Essential (primary) hypertension: Secondary | ICD-10-CM

## 2012-09-26 LAB — PSA: PSA: 0.92 ng/mL (ref <=4.00)

## 2012-09-26 LAB — POCT CBC
Granulocyte percent: 54.2 %G (ref 37–80)
HCT, POC: 50.5 % (ref 43.5–53.7)
Hemoglobin: 16.2 g/dL (ref 14.1–18.1)
POC Granulocyte: 2.5 (ref 2–6.9)
RBC: 5.35 M/uL (ref 4.69–6.13)
RDW, POC: 14.2 %

## 2012-09-26 LAB — POCT UA - MICROSCOPIC ONLY
Bacteria, U Microscopic: NEGATIVE
Casts, Ur, LPF, POC: NEGATIVE
WBC, Ur, HPF, POC: NEGATIVE

## 2012-09-26 LAB — POCT URINALYSIS DIPSTICK
Bilirubin, UA: NEGATIVE
Glucose, UA: NEGATIVE
Nitrite, UA: NEGATIVE
Spec Grav, UA: 1.02
Urobilinogen, UA: 0.2

## 2012-09-26 LAB — COMPREHENSIVE METABOLIC PANEL
AST: 26 U/L (ref 0–37)
Albumin: 4.5 g/dL (ref 3.5–5.2)
Alkaline Phosphatase: 56 U/L (ref 39–117)
Glucose, Bld: 117 mg/dL — ABNORMAL HIGH (ref 70–99)
Potassium: 3.8 mEq/L (ref 3.5–5.3)
Sodium: 141 mEq/L (ref 135–145)
Total Protein: 7.5 g/dL (ref 6.0–8.3)

## 2012-09-26 LAB — LIPID PANEL
Cholesterol: 133 mg/dL (ref 0–200)
Total CHOL/HDL Ratio: 3.2 Ratio
VLDL: 27 mg/dL (ref 0–40)

## 2012-09-26 LAB — GLUCOSE, POCT (MANUAL RESULT ENTRY): POC Glucose: 126 mg/dl — AB (ref 70–99)

## 2012-09-26 LAB — URIC ACID: Uric Acid, Serum: 8.5 mg/dL — ABNORMAL HIGH (ref 4.0–7.8)

## 2012-09-26 LAB — POCT GLYCOSYLATED HEMOGLOBIN (HGB A1C): Hemoglobin A1C: 6.2

## 2012-09-26 MED ORDER — LOSARTAN POTASSIUM 100 MG PO TABS: 100.0000 mg | ORAL_TABLET | Freq: Every day | ORAL | Status: DC

## 2012-09-26 MED ORDER — HYDROCHLOROTHIAZIDE 25 MG PO TABS: 25.0000 mg | ORAL_TABLET | Freq: Every day | ORAL | Status: DC

## 2012-09-26 MED ORDER — ROSUVASTATIN CALCIUM 10 MG PO TABS: 10.0000 mg | ORAL_TABLET | Freq: Every day | ORAL | Status: DC

## 2012-09-26 MED ORDER — CLONIDINE HCL 0.1 MG PO TABS: 0.1000 mg | ORAL_TABLET | Freq: Two times a day (BID) | ORAL | Status: DC

## 2012-09-26 MED ORDER — INDOMETHACIN 25 MG PO CAPS: ORAL_CAPSULE | ORAL | Status: DC

## 2012-09-26 MED ORDER — ALLOPURINOL 300 MG PO TABS: 300.0000 mg | ORAL_TABLET | Freq: Every day | ORAL | Status: DC

## 2012-09-26 MED ORDER — KETOCONAZOLE 2 % EX CREA: TOPICAL_CREAM | Freq: Every day | CUTANEOUS | Status: DC

## 2012-09-26 NOTE — Patient Instructions (Addendum)
The changes on your EKG and I have made you a referral to the cardiologist. You can continue on all the same medications you're on. Recheck here in 4 months or as a scheduled appointment at 104.

## 2012-09-26 NOTE — Telephone Encounter (Signed)
Spoke to pharmacy/ you had no sig for this only to take for acute gout flare, I advised pharmacy 1 bid prn acute gout flare. Kenita Bines

## 2012-09-26 NOTE — Progress Notes (Signed)
@UMFCLOGO @  Patient ID: Danny Vazquez MRN: [id number], DOB: 1961-08-15 52 y.o. Date of Encounter: 09/26/2012, 8:47 AM  Primary Physician: Tereso Newcomer, PA  Chief Complaint: Physical (CPE)  HPI: 52 y.o. y/o male with history noted below here for CPE.  Doing well. Pt is here to have medications refilled as well. He has made significant changes in his lifestyle and has become healthier. He previously was diagnosed with Hypertension and Hyperlipidemia. He has had a colonoscopy recently. He does not see any specialist. He had a stress test many years ago that resulting in no abnormal findings.  No issues/complaints.  Review of Systems:  Consitutional: No fever, chills, fatigue, night sweats, lymphadenopathy, or weight changes. Eyes: No visual changes, eye redness, or discharge. ENT/Mouth: Ears: No otalgia, tinnitus, hearing loss, discharge. Nose: No congestion, rhinorrhea, sinus pain, or epistaxis. Throat: Positive for  sore throat, post nasal drip, or teeth pain. Cardiovascular: No CP, palpitations, diaphoresis, DOE, edema, orthopnea, PND. Respiratory: No cough, hemoptysis, SOB, or wheezing. Gastrointestinal: No anorexia, dysphagia, reflux, pain, nausea, vomiting, hematemesis, diarrhea, constipation, BRBPR, or melena. Genitourinary: No dysuria, frequency, urgency, hematuria, incontinence, nocturia, decreased urinary stream, discharge, impotence, or testicular pain/masses. Musculoskeletal: No decreased ROM, myalgias, stiffness, joint swelling, or weakness. Skin: No rash, erythema, lesion changes, pain, warmth, jaundice, or pruritis. Neurological: No headache, dizziness, syncope, seizures, tremors, memory loss, coordination problems, or paresthesias. Psychological: No anxiety, depression, hallucinations, SI/HI. Endocrine: No fatigue, polydipsia, polyphagia, polyuria, or known diabetes. All other systems were reviewed and are otherwise negative.  No past medical history on file.   No past  surgical history on file.  Home Meds:  Prior to Admission  medications   Not on File    Allergies:  Allergies  Allergen Reactions  . Penicillins     REACTION: swelling and itching    History   Social History  . Marital Status: Married    Spouse Name: N/A    Number of Children: N/A  . Years of Education: N/A   Occupational History  . Not on file.   Social History Main Topics  . Smoking status: Not on file  . Smokeless tobacco: Not on file  . Alcohol Use: Not on file  . Drug Use: Not on file  . Sexually Active: Not on file   Other Topics Concern  . Not on file   Social History Narrative  . No narrative on file    No family history on file.  Physical Exam  Blood pressure 138/80, pulse 84, temperature 98.3 F (36.8 C), temperature source Oral, resp. rate 16, height 5' 6.5" (1.689 m), weight 206 lb (93.441 kg), SpO2 96.00%.  General: Well developed, well nourished, in no acute distress. HEENT: Normocephalic, atraumatic. Conjunctiva pink, sclera non-icteric. Pupils 2 mm constricting to 1 mm, round, regular, and equally reactive to light and accomodation. EOMI. Internal auditory canal clear. TMs with good cone of light and without pathology. Nasal mucosa pink. Nares are without discharge. No sinus tenderness. Oral mucosa pink. Dentition . Pharynx without exudate.   Neck: Supple. Trachea midline. No thyromegaly. Full ROM. No lymphadenopathy. Lungs: Clear to auscultation bilaterally without wheezes, rales, or rhonchi. Breathing is of normal effort and unlabored. Cardiovascular: RRR with S1 S2. No murmurs, rubs, or gallops appreciated. Distal pulses 2+ symmetrically. No carotid or abdominal bruits.  Abdomen: Soft, non-tender, non-distended with normoactive bowel sounds. No hepatosplenomegaly or masses. No rebound/guarding. No CVA tenderness. Without hernias.  Rectal: No external hemorrhoids or fissures. Rectal vault without masses.   Genitourinary:  circumcised male. No  penile lesions. Testes descended bilaterally, and smooth without tenderness or masses.  Musculoskeletal: Full range of motion and 5/5 strength throughout. Without swelling, atrophy, tenderness, crepitus, or warmth. Extremities without clubbing, cyanosis, or edema. Calves supple. Skin: Warm and moist without erythema, ecchymosis, wounds, or rash. Neuro: A+Ox3. CN II-XII grossly intact. Moves all extremities spontaneously. Full sensation throughout. Normal gait. DTR 2+ throughout upper and lower extremities. Finger to nose intact. Psych:  Responds to questions appropriately with a normal affect.   Results for orders placed in visit on 09/26/12  POCT CBC      Component Value Range   WBC 4.7  4.6 - 10.2 K/uL   Lymph, poc 1.9  0.6 - 3.4   POC LYMPH PERCENT 39.4  10 - 50 %L   MID (cbc) 0.3  0 - 0.9   POC MID % 6.4  0 - 12 %M   POC Granulocyte 2.5  2 - 6.9   Granulocyte percent 54.2  37 - 80 %G   RBC 5.35  4.69 - 6.13 M/uL   Hemoglobin 16.2  14.1 - 18.1 g/dL   HCT, POC 16.1  09.6 - 53.7 %   MCV 94.3  80 - 97 fL   MCH, POC 30.3  27 - 31.2 pg   MCHC 32.1  31.8 - 35.4 g/dL   RDW, POC 04.5     Platelet Count, POC 171  142 - 424 K/uL   MPV 9.3  0 - 99.8 fL  GLUCOSE, POCT (MANUAL RESULT ENTRY)      Component Value Range   POC Glucose 126 (*) 70 - 99 mg/dl  POCT GLYCOSYLATED HEMOGLOBIN (HGB A1C)      Component Value Range   Hemoglobin A1C 6.2    POCT UA - MICROSCOPIC ONLY      Component Value Range   WBC, Ur, HPF, POC neg     RBC, urine, microscopic 0-2     Bacteria, U Microscopic neg     Mucus, UA neg     Epithelial cells, urine per micros 0-1     Crystals, Ur, HPF, POC neg     Casts, Ur, LPF, POC neg     Yeast, UA neg    POCT URINALYSIS DIPSTICK      Component Value Range   Color, UA yellow     Clarity, UA clear     Glucose, UA neg     Bilirubin, UA neg     Ketones, UA neg     Spec Grav, UA 1.020     Blood, UA moderate     pH, UA 5.5     Protein, UA neg     Urobilinogen, UA  0.2     Nitrite, UA neg     Leukocytes, UA Negative    POCT RAPID STREP A (OFFICE)      Component Value Range   Rapid Strep A Screen Negative  Negative  EKG these T wave inversion in lead 3 and aVF V5 and V6 Studies: CBC, CMET, Lipid, PSA, TSH,      Assessment/Plan: He does have an abnormal EKG. His blood pressure and diabetes seem to be under good control. I do think he should have a cardiology evaluation.  52 y.o. y/o   male here for CPE  -  Signed, Earl Lites, MD 09/26/2012 8:47 AM

## 2012-10-04 ENCOUNTER — Ambulatory Visit (INDEPENDENT_AMBULATORY_CARE_PROVIDER_SITE_OTHER): Payer: 59 | Admitting: Cardiology

## 2012-10-04 ENCOUNTER — Encounter: Payer: Self-pay | Admitting: Cardiology

## 2012-10-04 VITALS — BP 117/80 | HR 69 | Ht 66.0 in | Wt 210.8 lb

## 2012-10-04 DIAGNOSIS — R9431 Abnormal electrocardiogram [ECG] [EKG]: Secondary | ICD-10-CM | POA: Insufficient documentation

## 2012-10-04 NOTE — Patient Instructions (Addendum)
The current medical regimen is effective;  continue present plan and medications.  Your physician has requested that you have an exercise tolerance test. For further information please visit [email or web address]. Please also follow instruction sheet, as given.  Further follow up will be based on these results 

## 2012-10-04 NOTE — Progress Notes (Signed)
HPI The patient presents for evaluation of an abnormal EKG. He has had no prior cardiac history of heart disease. He reports a treadmill test years ago. He didn't recall it but I was able to find an echo from 2011 which was normal. He does have an abnormal EKG with a suggestion of lateral T-wave inversions. These are slightly dynamic between yesterday and today. He was referred for evaluation of this. However, he does not report any palpitations, presyncope or syncope. He does not have any chest pressure, neck or arm discomfort. He's had an intentional weight loss as he's been doing a little more exercise and dieting. He has no swelling. He has no shortness of breath, PND or orthopnea.  Allergies  Allergen Reactions  . Penicillins     REACTION: swelling and itching    Current Outpatient Prescriptions  Medication Sig Dispense Refill  . allopurinol (ZYLOPRIM) 300 MG tablet Take 1 tablet (300 mg total) by mouth daily.  30 tablet  11  . cloNIDine (CATAPRES) 0.1 MG tablet Take 1 tablet (0.1 mg total) by mouth 2 (two) times daily.  60 tablet  11  . hydrochlorothiazide (HYDRODIURIL) 25 MG tablet Take 1 tablet (25 mg total) by mouth daily.  30 tablet  11  . indomethacin (INDOCIN) 25 MG capsule This medicine is to be used for acute flareups of gout. It is not to be taken on a regular basis  60 capsule  3  . losartan (COZAAR) 100 MG tablet Take 1 tablet (100 mg total) by mouth daily.  30 tablet  11  . rosuvastatin (CRESTOR) 10 MG tablet Take 1 tablet (10 mg total) by mouth daily.  30 tablet  11   No current facility-administered medications for this visit.    Past Medical History  Diagnosis Date  . Allergy   . Depression   . Diabetes mellitus without complication     No past surgical history on file.  Family History  Problem Relation Age of Onset  . Diabetes Mother   . Heart disease Father     History   Social History  . Marital Status: Married    Spouse Name: N/A    Number of  Children: N/A  . Years of Education: N/A   Occupational History  . Not on file.   Social History Main Topics  . Smoking status: Former Smoker    Quit date: 10/04/1978  . Smokeless tobacco: Not on file  . Alcohol Use: Not on file  . Drug Use: Not on file  . Sexually Active: Not on file   Other Topics Concern  . Not on file   Social History Narrative  . No narrative on file    ROS:  Positive for seasonal allergies. Otherwise negative for all other systems.  PHYSICAL EXAM BP 117/80  Pulse 69  Ht 5\' 6"  (1.676 m)  Wt 210 lb 12.8 oz (95.618 kg)  BMI 34.04 kg/m2 GENERAL:  Well appearing HEENT:  Pupils equal round and reactive, fundi not visualized, oral mucosa unremarkable NECK:  No jugular venous distention, waveform within normal limits, carotid upstroke brisk and symmetric, no bruits, no thyromegaly LYMPHATICS:  No cervical, inguinal adenopathy LUNGS:  Clear to auscultation bilaterally BACK:  No CVA tenderness CHEST:  Unremarkable HEART:  PMI not displaced or sustained,S1 and S2 within normal limits, no S3, no S4, no clicks, no rubs, no murmurs ABD:  Flat, positive bowel sounds normal in frequency in pitch, no bruits, no rebound, no guarding, no midline  pulsatile mass, no hepatomegaly, no splenomegaly EXT:  2 plus pulses throughout, no edema, no cyanosis no clubbing SKIN:  No rashes no nodules NEURO:  Cranial nerves II through XII grossly intact, motor grossly intact throughout PSYCH:  Cognitively intact, oriented to person place and time  EKG:  Sinus rhythm, rate, axis within normal limits, intervals within normal limits, nonspecific lateral T-wave changes. 10/04/2012  ASSESSMENT AND PLAN  Abnormal EKG - I think there is a low likelihood of obstructive coronary disease. I will bring the patient back for a POET (Plain Old Exercise Test). This will allow me to screen for obstructive coronary disease, risk stratify and very importantly provide a prescription for  exercise.  HTN - His blood pressure is well controlled. We did follow this during a stress test and we can continue the other medicines as listed.

## 2012-11-01 ENCOUNTER — Encounter: Payer: 59 | Admitting: Physician Assistant

## 2012-11-10 ENCOUNTER — Encounter (INDEPENDENT_AMBULATORY_CARE_PROVIDER_SITE_OTHER): Payer: 59 | Admitting: Physician Assistant

## 2012-11-10 NOTE — Progress Notes (Deleted)
Exercise Treadmill Test  Pre-Exercise Testing Evaluation Rhythm: {CHL RHYTHM BASELINE EKG FOR WUJ:81191478}  Rate: 78                 Test  Exercise Tolerance Test Ordering MD: Angelina Sheriff, MD  Interpreting MD: Tereso Newcomer, PA-C  Unique Test No: 1  Treadmill:  1  Indication for ETT: Abnormal EKG  Contraindication to ETT: No   Stress Modality: exercise - treadmill  Cardiac Imaging Performed: non   Protocol: standard Bruce - maximal  Max BP:  ***/***  Max MPHR (bpm):  168 85% MPR (bpm):  143  MPHR obtained (bpm):  *** % MPHR obtained:  ***  Reached 85% MPHR (min:sec):  *** Total Exercise Time (min-sec):  ***  Workload in METS:  *** Borg Scale: ***  Reason ETT Terminated:  {CHL REASON TERMINATED FOR GNF:62130865}    ST Segment Analysis At Rest: {CHL ST SEGMENT AT REST FOR HQI:69629528} With Exercise: {CHL ST SEGMENT WITH EXERCISE FOR UXL:24401027}  Other Information Arrhythmia:  {CHL ARRHYTHMIA FOR OZD:66440347} Angina during ETT:  {CHL ANGINA DURING QQV:95638756} Quality of ETT:  {CHL QUALITY OF EPP:29518841}  ETT Interpretation:  {CHL INTERPRETATION FOR YSA:63016010}  Comments: ***  Recommendations: ***

## 2012-11-11 NOTE — Progress Notes (Signed)
This encounter was created in error - please disregard.

## 2012-11-28 ENCOUNTER — Ambulatory Visit (INDEPENDENT_AMBULATORY_CARE_PROVIDER_SITE_OTHER): Payer: 59 | Admitting: Physician Assistant

## 2012-11-28 DIAGNOSIS — R9431 Abnormal electrocardiogram [ECG] [EKG]: Secondary | ICD-10-CM

## 2012-11-28 NOTE — Progress Notes (Signed)
Exercise Treadmill Test  Pre-Exercise Testing Evaluation Rhythm: normal sinus  Rate: 82                 Test  Exercise Tolerance Test Ordering MD: Angelina Sheriff, MD  Interpreting MD: Tereso Newcomer, PA-C  Unique Test No: 1  Treadmill:  1  Indication for ETT: Abnormal EKG  Contraindication to ETT: No   Stress Modality: exercise - treadmill  Cardiac Imaging Performed: non   Protocol: standard Bruce - maximal  Max BP:  222/121  Max MPHR (bpm):  168 85% MPR (bpm):  143  MPHR obtained (bpm):  146 % MPHR obtained:  86%  Reached 85% MPHR (min:sec):  5:43 Total Exercise Time (min-sec):  6:00  Workload in METS:  7.0 Borg Scale: 17  Reason ETT Terminated:  exaggerated hypertensive response    ST Segment Analysis At Rest: non-specific ST segment slurring With Exercise: no evidence of significant ST depression  Other Information Arrhythmia:  No Angina during ETT:  absent (0) Quality of ETT:  diagnostic  ETT Interpretation:  normal - no evidence of ischemia by ST analysis  Comments: Fair exercise tolerance. No chest pain. Hypertensive BP response to exercise. No significant ST-T changes to suggest ischemia.  There was good HR recovery in the 1st minute post exercise (2 mph at 2% grade).  Recommendations: F/u with Dr. Rollene Rotunda as directed. Luna Glasgow, PA-C  9:43 AM 11/28/2012

## 2013-06-30 ENCOUNTER — Ambulatory Visit (INDEPENDENT_AMBULATORY_CARE_PROVIDER_SITE_OTHER): Payer: 59 | Admitting: Emergency Medicine

## 2013-06-30 VITALS — BP 142/88 | HR 72 | Temp 97.9°F | Resp 18 | Ht 66.5 in | Wt 197.0 lb

## 2013-06-30 DIAGNOSIS — R350 Frequency of micturition: Secondary | ICD-10-CM

## 2013-06-30 DIAGNOSIS — E785 Hyperlipidemia, unspecified: Secondary | ICD-10-CM

## 2013-06-30 DIAGNOSIS — E119 Type 2 diabetes mellitus without complications: Secondary | ICD-10-CM

## 2013-06-30 LAB — POCT UA - MICROSCOPIC ONLY
Bacteria, U Microscopic: NEGATIVE
Crystals, Ur, HPF, POC: NEGATIVE
Mucus, UA: NEGATIVE

## 2013-06-30 LAB — COMPREHENSIVE METABOLIC PANEL
AST: 20 U/L (ref 0–37)
Albumin: 4.2 g/dL (ref 3.5–5.2)
Alkaline Phosphatase: 62 U/L (ref 39–117)
BUN: 18 mg/dL (ref 6–23)
Potassium: 4.3 mEq/L (ref 3.5–5.3)

## 2013-06-30 LAB — POCT CBC
Hemoglobin: 15.8 g/dL (ref 14.1–18.1)
Lymph, poc: 2.2 (ref 0.6–3.4)
MCHC: 32.2 g/dL (ref 31.8–35.4)
MPV: 10.4 fL (ref 0–99.8)
POC Granulocyte: 1.5 — AB (ref 2–6.9)
POC MID %: 7.2 %M (ref 0–12)
RBC: 5.09 M/uL (ref 4.69–6.13)

## 2013-06-30 LAB — POCT URINALYSIS DIPSTICK
Bilirubin, UA: NEGATIVE
Glucose, UA: 500
Spec Grav, UA: 1.01

## 2013-06-30 LAB — LIPID PANEL
HDL: 36 mg/dL — ABNORMAL LOW (ref >39)
LDL Cholesterol: 136 mg/dL — ABNORMAL HIGH (ref 0–99)
Total CHOL/HDL Ratio: 5.8 Ratio
VLDL: 35 mg/dL (ref 0–40)

## 2013-06-30 LAB — TSH: TSH: 1.776 u[IU]/mL (ref 0.350–4.500)

## 2013-06-30 LAB — GLUCOSE, POCT (MANUAL RESULT ENTRY): POC Glucose: 357 mg/dl — AB (ref 70–99)

## 2013-06-30 MED ORDER — METFORMIN HCL 500 MG PO TABS: 500.0000 mg | ORAL_TABLET | Freq: Two times a day (BID) | ORAL | Status: DC

## 2013-06-30 NOTE — Patient Instructions (Signed)

## 2013-06-30 NOTE — Progress Notes (Signed)
Subjective:    Patient ID: Danny Vazquez, male    DOB: 09-Jun-1961, 53 y.o.   MRN: [id number]  HPI This chart was scribed for Viviann Spare Bauer Ausborn-MD- by Ladona Ridgel Day, Scribe. This patient was seen in room 12 and the patient's care was started at 8:53 AM.  HPI Comments: Danny Vazquez is a 52 y.o. 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   Today he presents to the [facility] complaining of urinary frequency and thirst, onset 3 weeks ago. He also reports difficulty with short range vision which is new for him. He states unsure of his blood sugar levels and has had not recent change in his diet or weight. He reports his diet consists of corn, rice and cassava. He also reports that he believes there has been sugar in his urine recently.   Past Medical History  Diagnosis Date  . Allergy   . Depression   . Diabetes mellitus without complication     Diet controlled  . HTN (hypertension)     Past Surgical History  Procedure Laterality Date  . None      Family History  Problem Relation Age of Onset  . Diabetes Mother   . Heart disease Father     CHF    History   Social History  . Marital Status: Married    Spouse Name: N/A    Number of Children: 4  . Years of Education: N/A   Occupational History  .  [employer]   Social History Main Topics  . Smoking status: Former Smoker    Quit date: 10/04/1978  . Smokeless tobacco: Not on file  . Alcohol Use: Not on file  . Drug Use: Not on file  . Sexual Activity: Not on file   Other Topics Concern  . Not on file   Social History Narrative   Lives with wife.m      Allergies  Allergen Reactions  . Penicillins     REACTION: swelling and itching    Patient Active Problem List   Diagnosis Date Noted  . Abnormal EKG 10/04/2012  . COLONIC POLYPS, ADENOMATOUS, BENIGN 04/21/2010  . VENTRICULAR HYPERTROPHY, LEFT 03/26/2010  . SLEEP APNEA 02/12/2010  . URINALYSIS, ABNORMAL 02/10/2010  . PHARYNGITIS  12/24/2009  . GOUT, UNSPECIFIED 12/12/2009  . DIABETES MELLITUS, TYPE II 11/12/2009  . TINEA VERSICOLOR 10/28/2009  . LUMBAGO 10/28/2009  . SNORING 10/28/2009  . ELECTROCARDIOGRAM, ABNORMAL 10/28/2009  . PENILE LESION 03/18/2009  . WRIST PAIN, LEFT 03/18/2009  . KNEE PAIN, BILATERAL 03/18/2009  . DEPRESSION 06/13/2008  . ROTATOR CUFF SYNDROME, LEFT 06/13/2008  . OTHER ABNORMAL GLUCOSE 10/15/2007  . VIRAL INFECTION, ACUTE 09/27/2007  . PTERYGIUM, BILATERAL 05/08/2007  . ERECTILE DYSFUNCTION, SECONDARY TO MEDICATION 05/08/2007  . HYPERLIPIDEMIA 04/21/2007  . HYPERTENSION 04/21/2007  . ALLERGIC RHINITIS, SEASONAL 04/21/2007    Results for orders placed in visit on 09/26/12  COMPREHENSIVE METABOLIC PANEL      Result Value Range   Sodium 141  135 - 145 mEq/L   Potassium 3.8  3.5 - 5.3 mEq/L   Chloride 102  96 - 112 mEq/L   CO2 29  19 - 32 mEq/L   Glucose, Bld 117 (*) 70 - 99 mg/dL   BUN 23  6 - 23 mg/dL   Creat 4.09  8.11 - 9.14 mg/dL   Total Bilirubin 0.6  0.3 - 1.2 mg/dL   Alkaline Phosphatase 56  39 - 117 U/L   AST 26  0 - 37 U/L   ALT 23  0 - 53 U/L   Total Protein 7.5  6.0 - 8.3 g/dL   Albumin 4.5  3.5 - 5.2 g/dL   Calcium 9.1  8.4 - 16.1 mg/dL  TSH      Result Value Range   TSH 2.330  0.350 - 4.500 uIU/mL  LIPID PANEL      Result Value Range   Cholesterol 133  0 - 200 mg/dL   Triglycerides 096  <045 mg/dL   HDL 42  >40 mg/dL   Total CHOL/HDL Ratio 3.2     VLDL 27  0 - 40 mg/dL   LDL Cholesterol 64  0 - 99 mg/dL  PSA      Result Value Range   PSA 0.92  <=4.00 ng/mL  URIC ACID      Result Value Range   Uric Acid, Serum 8.5 (*) 4.0 - 7.8 mg/dL  POCT CBC      Result Value Range   WBC 4.7  4.6 - 10.2 K/uL   Lymph, poc 1.9  0.6 - 3.4   POC LYMPH PERCENT 39.4  10 - 50 %L   MID (cbc) 0.3  0 - 0.9   POC MID % 6.4  0 - 12 %M   POC Granulocyte 2.5  2 - 6.9   Granulocyte percent 54.2  37 - 80 %G   RBC 5.35  4.69 - 6.13 M/uL   Hemoglobin 16.2  14.1 - 18.1 g/dL    HCT, POC 98.1  19.1 - 53.7 %   MCV 94.3  80 - 97 fL   MCH, POC 30.3  27 - 31.2 pg   MCHC 32.1  31.8 - 35.4 g/dL   RDW, POC 47.8     Platelet Count, POC 171  142 - 424 K/uL   MPV 9.3  0 - 99.8 fL  GLUCOSE, POCT (MANUAL RESULT ENTRY)      Result Value Range   POC Glucose 126 (*) 70 - 99 mg/dl  POCT GLYCOSYLATED HEMOGLOBIN (HGB A1C)      Result Value Range   Hemoglobin A1C 6.2    POCT UA - MICROSCOPIC ONLY      Result Value Range   WBC, Ur, HPF, POC neg     RBC, urine, microscopic 0-2     Bacteria, U Microscopic neg     Mucus, UA neg     Epithelial cells, urine per micros 0-1     Crystals, Ur, HPF, POC neg     Casts, Ur, LPF, POC neg     Yeast, UA neg    POCT URINALYSIS DIPSTICK      Result Value Range   Color, UA yellow     Clarity, UA clear     Glucose, UA neg     Bilirubin, UA neg     Ketones, UA neg     Spec Grav, UA 1.020     Blood, UA moderate     pH, UA 5.5     Protein, UA neg     Urobilinogen, UA 0.2     Nitrite, UA neg     Leukocytes, UA Negative    POCT RAPID STREP A (OFFICE)      Result Value Range   Rapid Strep A Screen Negative  Negative    1. Frequent urination     No orders of the defined types were placed in this encounter.     Review of Systems  Eyes: Positive  for visual disturbance (difficulty w/near sighted vision).  Genitourinary: Positive for frequency.   Triage Vitals: BP 142/88  Pulse 72  Temp(Src) 97.9 F (36.6 C) (Oral)  Resp 18  Ht 5' 6.5" (1.689 m)  Wt 197 lb (89.359 kg)  BMI 31.32 kg/m2  SpO2 98%     Objective:   Physical Exam Physical Exam  Nursing note and vitals reviewed. Constitutional: Patient is oriented to person, place, and time. Patient appears well-developed and well-nourished. No distress.  HENT: Cataracts   Head: Normocephalic and atraumatic.  Neck: Neck supple. No tracheal deviation present.  Cardiovascular: Normal rate, regular rhythm and normal heart sounds.   No murmur heard. Pulmonary/Chest: Effort  normal and breath sounds normal. No respiratory distress. Patient has no wheezes. Patient has no rales.  Musculoskeletal: Normal range of motion.  Neurological: Patient is alert and oriented to person, place, and time.  Skin: Skin is warm and dry.  Psychiatric: Patient has a normal mood and affect. Patient's behavior is normal.   Results for orders placed in visit on 06/30/13  POCT URINALYSIS DIPSTICK      Result Value Range   Color, UA yellow     Clarity, UA clear     Glucose, UA 500     Bilirubin, UA neg     Ketones, UA trace     Spec Grav, UA 1.010     Blood, UA small     pH, UA 5.0     Protein, UA neg     Urobilinogen, UA 0.2     Nitrite, UA neg     Leukocytes, UA Negative    POCT UA - MICROSCOPIC ONLY      Result Value Range   WBC, Ur, HPF, POC 0-1     RBC, urine, microscopic 0-2     Bacteria, U Microscopic neg     Mucus, UA neg     Epithelial cells, urine per micros 0-1     Crystals, Ur, HPF, POC neg     Casts, Ur, LPF, POC neg     Yeast, UA neg    POCT CBC      Result Value Range   WBC 4.0 (*) 4.6 - 10.2 K/uL   Lymph, poc 2.2  0.6 - 3.4   POC LYMPH PERCENT 54.2 (*) 10 - 50 %L   MID (cbc) 0.3  0 - 0.9   POC MID % 7.2  0 - 12 %M   POC Granulocyte 1.5 (*) 2 - 6.9   Granulocyte percent 38.6  37 - 80 %G   RBC 5.09  4.69 - 6.13 M/uL   Hemoglobin 15.8  14.1 - 18.1 g/dL   HCT, POC 16.1  09.6 - 53.7 %   MCV 96.3  80 - 97 fL   MCH, POC 31.0  27 - 31.2 pg   MCHC 32.2  31.8 - 35.4 g/dL   RDW, POC 04.5     Platelet Count, POC 194  142 - 424 K/uL   MPV 10.4  0 - 99.8 fL  GLUCOSE, POCT (MANUAL RESULT ENTRY)      Result Value Range   POC Glucose 357 (*) 70 - 99 mg/dl  POCT GLYCOSYLATED HEMOGLOBIN (HGB A1C)      Result Value Range   Hemoglobin A1C 13.1    IFOBT (OCCULT BLOOD)      Result Value Range   IFOBT Negative          Assessment & Plan:

## 2013-07-04 ENCOUNTER — Encounter: Payer: 59 | Attending: Emergency Medicine | Admitting: *Deleted

## 2013-07-04 ENCOUNTER — Encounter: Payer: Self-pay | Admitting: *Deleted

## 2013-07-04 VITALS — Ht 68.0 in | Wt 201.2 lb

## 2013-07-04 DIAGNOSIS — Z713 Dietary counseling and surveillance: Secondary | ICD-10-CM | POA: Insufficient documentation

## 2013-07-04 DIAGNOSIS — E119 Type 2 diabetes mellitus without complications: Secondary | ICD-10-CM | POA: Insufficient documentation

## 2013-07-04 NOTE — Progress Notes (Signed)
Appt start time: 1100 end time:  1230.  Assessment:  Patient was seen on  07/04/13 for individual diabetes education. New diagnosis of diabetes with A1c of 13.1% last Friday. States he was planning to go sugar free for one year anyway, has also eliminated starches too. Lives with wife, they share the food purchases and he prepares most of his own meals. He does not have a meter to check his BG yet. 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 No regular physical activity right now but has plan to block time for treadmill 2 days a week going forward. Previously active in [employer] and would like to resume that.   Current HbA1c: 6.2% on 06/30/13  Preferred Learning Style:   Visual  Learning Readiness:   Ready  MEDICATIONS: see list, diabetes medication is now Metformin  DIETARY INTAKE:  24-hr recall:  B ( AM): skips maybe twice a week, usually eggs with eggplant and other vegetables, black coffee  Snk ( AM): not usually, unless baby carrots  L ( PM): brings from home usually; soup, rice and pasta in the past, salad with oil and vinegar dressing, water or coffee  OR 6" [employer] tuna sandwich OR small cheeseburger in the past Snk ( PM): used to have cake or other dessert OR now he will have nuts D ( PM): lean meat, vegetables, no more starches in past week., water or coffee Snk ( PM): nuts OR fresh fruit Beverages: water or black coffee  Usual physical activity: not lately, he states he plans to resume  Estimated energy needs: 1600 calories 180 g carbohydrates 120 g protein 44 g fat  Intervention:  Nutrition counseling provided.  Discussed diabetes disease process and treatment options.  Discussed physiology of diabetes and role of obesity on insulin resistance.  Encouraged moderate weight reduction to improve glucose levels.  Discussed role of  medications and diet in glucose control  Provided education on macronutrients on glucose levels.  Provided education on carb counting, importance of regularly scheduled meals/snacks, and meal planning  Discussed effects of physical activity on glucose levels and long-term glucose control.  Recommended 150 minutes of physical activity/week.  Reviewed patient medications.  Discussed role of medication on blood glucose and possible side effects  Discussed blood glucose monitoring and interpretation.  Discussed recommended target ranges and individual ranges.    Described short-term complications: hyper- and hypo-glycemia.  Discussed causes,symptoms, and treatment options.  Discussed prevention, detection, and treatment of long-term complications.  Discussed the role of prolonged elevated glucose levels on body systems.  Discussed role of stress on blood glucose levels and discussed strategies to manage psychosocial issues.  Discussed recommendations for long-term diabetes self-care.  Established checklist for medical, dental, and emotional self-care.  Plan:  Aim for 2 Carb Choices per meal (30 grams) +/- 1 either way  Aim for 0-1 Carbs per snack if hungry  Consider reading food labels for Total Carbohydrate of foods Consider  increasing your activity level by walking on treadmill and weight lifting for 15-30 minutes daily as tolerated Consider asking MD about checking BG  Teaching Method Utilized: all of the following: Visual Auditory Hands on  Handouts given during visit include: Living Well with Diabetes Carb Counting and Food Label handouts Meal Plan Card  Barriers to learning/adherence to lifestyle change: none  Diabetes self-care support plan:   [facility] support  group available  Demonstrated degree of understanding via:  Teach Back   Monitoring/Evaluation:  Dietary intake, exercise, reading food labels, and body weight prn. He plans to call for follow up appointment after he  sees his MD in January.

## 2013-07-04 NOTE — Patient Instructions (Signed)
Plan:  Aim for 2 Carb Choices per meal (30 grams) +/- 1 either way  Aim for 0-1 Carbs per snack if hungry  Consider reading food labels for Total Carbohydrate of foods Consider  increasing your activity level by walking on treadmill and weight lifting for 15-30 minutes daily as tolerated Consider asking MD about checking BG

## 2013-08-23 ENCOUNTER — Ambulatory Visit (INDEPENDENT_AMBULATORY_CARE_PROVIDER_SITE_OTHER): Payer: 59 | Admitting: Emergency Medicine

## 2013-08-23 VITALS — BP 171/121 | HR 68 | Temp 98.2°F | Resp 16 | Ht 67.0 in | Wt 207.0 lb

## 2013-08-23 DIAGNOSIS — E119 Type 2 diabetes mellitus without complications: Secondary | ICD-10-CM

## 2013-08-23 DIAGNOSIS — R350 Frequency of micturition: Secondary | ICD-10-CM

## 2013-08-23 DIAGNOSIS — I10 Essential (primary) hypertension: Secondary | ICD-10-CM

## 2013-08-23 LAB — GLUCOSE, POCT (MANUAL RESULT ENTRY): POC Glucose: 165 mg/dl — AB (ref 70–99)

## 2013-08-23 NOTE — Progress Notes (Signed)
   Subjective:    Patient ID: Danny Vazquez, male    DOB: 05-18-1961, 52 y.o.   MRN: [id number]  HPI Hx was provided by pt and medical records.  HPI Comments: Danny Vazquez is a 52 y.o. male who presents to the [facility] complaining of for follow-up appointment with new onset DM II. Reports he is taking recommended medications for DM. Reports he has followed up with a nutritionist and has made diet modifications. States he has not started checking his CBG at home. Reports associated improving nocturia and polyuria since he started taking metformin.    Review of Systems  Endocrine: Positive for polyuria.       Objective:   Physical Exam patient is alert and cooperative he is not in any distress. His chest was clear his heart is regular rate without murmurs. Results for orders placed in visit on 08/23/13  GLUCOSE, POCT (MANUAL RESULT ENTRY)      Result Value Range   POC Glucose 165 (*) 70 - 99 mg/dl         Assessment & Plan:  Patient feels better his sugar is 165. We'll continue metformin twice a day recheck in 6 weeks repeat hemoglobin A1c at that time. He will continue his diet. He is not interested in checking his sugars at the present time his blood pressure was up but he had not taken any of his medications yet today. Results for orders placed in visit on 08/23/13  GLUCOSE, POCT (MANUAL RESULT ENTRY)      Result Value Range   POC Glucose 165 (*) 70 - 99 mg/dl

## 2013-11-02 ENCOUNTER — Other Ambulatory Visit: Payer: Self-pay | Admitting: Emergency Medicine

## 2014-02-12 ENCOUNTER — Other Ambulatory Visit: Payer: Self-pay | Admitting: Emergency Medicine

## 2014-02-16 ENCOUNTER — Ambulatory Visit (INDEPENDENT_AMBULATORY_CARE_PROVIDER_SITE_OTHER): Payer: 59 | Admitting: Emergency Medicine

## 2014-02-16 VITALS — BP 118/80 | HR 69 | Temp 98.6°F | Resp 14

## 2014-02-16 DIAGNOSIS — I10 Essential (primary) hypertension: Secondary | ICD-10-CM

## 2014-02-16 DIAGNOSIS — E119 Type 2 diabetes mellitus without complications: Secondary | ICD-10-CM

## 2014-02-16 LAB — CBC WITH DIFFERENTIAL/PLATELET
BASOS PCT: 0 % (ref 0–1)
Basophils Absolute: 0 10*3/uL (ref 0.0–0.1)
Eosinophils Absolute: 0.1 10*3/uL (ref 0.0–0.7)
Eosinophils Relative: 2 % (ref 0–5)
HCT: 44.9 % (ref 39.0–52.0)
Hemoglobin: 15.6 g/dL (ref 13.0–17.0)
LYMPHS ABS: 2 10*3/uL (ref 0.7–4.0)
LYMPHS PCT: 62 % — AB (ref 12–46)
MCH: 30.5 pg (ref 26.0–34.0)
MCHC: 34.7 g/dL (ref 30.0–36.0)
MCV: 87.7 fL (ref 78.0–100.0)
MONO ABS: 0.2 10*3/uL (ref 0.1–1.0)
Monocytes Relative: 5 % (ref 3–12)
NEUTROS ABS: 1 10*3/uL — AB (ref 1.7–7.7)
NEUTROS PCT: 31 % — AB (ref 43–77)
Platelets: 181 10*3/uL (ref 150–400)
RBC: 5.12 MIL/uL (ref 4.22–5.81)
RDW: 13.7 % (ref 11.5–15.5)
WBC: 3.2 10*3/uL — ABNORMAL LOW (ref 4.0–10.5)

## 2014-02-16 LAB — POCT GLYCOSYLATED HEMOGLOBIN (HGB A1C)

## 2014-02-16 LAB — COMPREHENSIVE METABOLIC PANEL
ALBUMIN: 4 g/dL (ref 3.5–5.2)
ALK PHOS: 62 U/L (ref 39–117)
ALT: 16 U/L (ref 0–53)
AST: 15 U/L (ref 0–37)
BUN: 15 mg/dL (ref 6–23)
CALCIUM: 9.1 mg/dL (ref 8.4–10.5)
CHLORIDE: 94 meq/L — AB (ref 96–112)
CO2: 22 meq/L (ref 19–32)
Creat: 1.13 mg/dL (ref 0.50–1.35)
GLUCOSE: 385 mg/dL — AB (ref 70–99)
POTASSIUM: 4.5 meq/L (ref 3.5–5.3)
SODIUM: 134 meq/L — AB (ref 135–145)
TOTAL PROTEIN: 6.9 g/dL (ref 6.0–8.3)
Total Bilirubin: 0.9 mg/dL (ref 0.2–1.2)

## 2014-02-16 LAB — GLUCOSE, POCT (MANUAL RESULT ENTRY): POC Glucose: 380 mg/dl — AB (ref 70–99)

## 2014-02-16 MED ORDER — CRESTOR 10 MG PO TABS: 10.0000 mg | ORAL_TABLET | Freq: Every day | ORAL | Status: DC

## 2014-02-16 MED ORDER — HYDROCHLOROTHIAZIDE 25 MG PO TABS: 25.0000 mg | ORAL_TABLET | Freq: Every day | ORAL | Status: DC

## 2014-02-16 MED ORDER — FLUCONAZOLE 150 MG PO TABS: 150.0000 mg | ORAL_TABLET | Freq: Once | ORAL | Status: DC

## 2014-02-16 MED ORDER — METFORMIN HCL 1000 MG PO TABS: 1000.0000 mg | ORAL_TABLET | Freq: Two times a day (BID) | ORAL | Status: DC

## 2014-02-16 MED ORDER — METFORMIN HCL 500 MG PO TABS: 500.0000 mg | ORAL_TABLET | Freq: Two times a day (BID) | ORAL | Status: DC

## 2014-02-16 MED ORDER — ALLOPURINOL 300 MG PO TABS: 300.0000 mg | ORAL_TABLET | Freq: Every day | ORAL | Status: DC

## 2014-02-16 MED ORDER — CLONIDINE HCL 0.1 MG PO TABS: 0.1000 mg | ORAL_TABLET | Freq: Two times a day (BID) | ORAL | Status: DC

## 2014-02-16 MED ORDER — LOSARTAN POTASSIUM 100 MG PO TABS: 100.0000 mg | ORAL_TABLET | Freq: Every day | ORAL | Status: DC

## 2014-02-16 NOTE — Progress Notes (Signed)
Subjective:  This chart was scribed for Arlyss Queen, MD by Mercy Moore, Medial Scribe. This patient was seen in room 5 and the patient's care was started at 4:10 PM.    Patient ID: Danny Vazquez, male    DOB: [birth year], 53 y.o.   MRN: [id number]  HPI HPI Comments: Danny Vazquez is a 53 y.o. male who presents to the [facility] requesting medication refill and A1C check. Patient states that he has changed his diet/lifestyle and is exercising more. Today he reports loss of 22lbs. Patient does not check his A1C levels at home; states that they have not been checked since his last visit. Patient reports nocturia: gets up to urinate 3 times a night.   Genital sore Patient complaining of sore at the tip of his penis. He denies penile pain. Patient denies new sexual partners.    Patient Active Problem List   Diagnosis Date Noted  . Abnormal EKG 10/04/2012  . COLONIC POLYPS, ADENOMATOUS, BENIGN 04/21/2010  . VENTRICULAR HYPERTROPHY, LEFT 03/26/2010  . SLEEP APNEA 02/12/2010  . URINALYSIS, ABNORMAL 02/10/2010  . PHARYNGITIS 12/24/2009  . GOUT, UNSPECIFIED 12/12/2009  . DIABETES MELLITUS, TYPE II 11/12/2009  . TINEA VERSICOLOR 10/28/2009  . LUMBAGO 10/28/2009  . SNORING 10/28/2009  . ELECTROCARDIOGRAM, ABNORMAL 10/28/2009  . PENILE LESION 03/18/2009  . WRIST PAIN, LEFT 03/18/2009  . KNEE PAIN, BILATERAL 03/18/2009  . DEPRESSION 06/13/2008  . ROTATOR CUFF SYNDROME, LEFT 06/13/2008  . OTHER ABNORMAL GLUCOSE 10/15/2007  . VIRAL INFECTION, ACUTE 09/27/2007  . PTERYGIUM, BILATERAL 05/08/2007  . ERECTILE DYSFUNCTION, SECONDARY TO MEDICATION 05/08/2007  . HYPERLIPIDEMIA 04/21/2007  . HYPERTENSION 04/21/2007  . ALLERGIC RHINITIS, SEASONAL 04/21/2007   Past Medical History  Diagnosis Date  . Allergy   . Depression   . Diabetes mellitus without complication     Diet controlled  . HTN (hypertension)    Past Surgical History  Procedure Laterality Date  . None      Allergies  Allergen Reactions  . Penicillins     REACTION: swelling and itching   Prior to Admission medications   Medication Sig Start Date End Date Taking? Authorizing Joie Hipps  cloNIDine (CATAPRES) 0.1 MG tablet TAKE 1 TABLET BY MOUTH TWICE DAILY   Yes Mancel Bale, PA-C  CRESTOR 10 MG tablet TAKE 1 TABLET BY MOUTH ONCE DAILY   Yes Mancel Bale, PA-C  hydrochlorothiazide (HYDRODIURIL) 25 MG tablet TAKE 1 TABLET BY MOUTH ONCE DAILY   Yes Darlyne Russian, MD  losartan (COZAAR) 100 MG tablet TAKE 1 TABLET BY MOUTH ONCE DAILY   Yes Mancel Bale, PA-C  metFORMIN (GLUCOPHAGE) 500 MG tablet Take 1 tablet (500 mg total) by mouth 2 (two) times daily with a meal. 06/30/13  Yes Darlyne Russian, MD  allopurinol (ZYLOPRIM) 300 MG tablet Take 1 tablet (300 mg total) by mouth daily. 09/26/12   Darlyne Russian, MD  indomethacin (INDOCIN) 25 MG capsule This medicine is to be used for acute flareups of gout. It is not to be taken on a regular basis 09/26/12   Darlyne Russian, MD   History   Social History  . Marital Status: Married    Spouse Name: N/A    Number of Children: 4  . Years of Education: N/A   Occupational History  .  Un[employer]   Social History Main Topics  . Smoking status: Former Smoker    Quit date: 10/04/1978  . Smokeless tobacco: Not on file  .  Alcohol Use: Not on file  . Drug Use: Not on file  . Sexual Activity: Not on file   Other Topics Concern  . Not on file   Social History Narrative   Lives with wife.m         Review of Systems  Constitutional: Negative for fever and chills.  Respiratory: Negative for shortness of breath.   Cardiovascular: Negative for chest pain.  Genitourinary: Positive for discharge and genital sores. Negative for dysuria and penile pain.       Objective:   Physical Exam  Nursing note and vitals reviewed.   CONSTITUTIONAL: Well developed/well nourished HEAD: Normocephalic/atraumatic EYES: EOMI/PERRL; cataracts in left eye ENMT: Mucous  membranes moist NECK: supple no meningeal signs SPINE:entire spine nontender CV: S1/S2 noted, no murmurs/rubs/gallops noted LUNGS: Lungs are clear to auscultation bilaterally, no apparent distress ABDOMEN: soft, nontender, no rebound or guarding GU:no cva tenderness NEURO: Pt is awake/alert, moves all extremitiesx4 EXTREMITIES: pulses normal, full ROM SKIN: warm, color normal PSYCH: no abnormalities of mood noted Genital: white discharge at this meatus which is KOH positive   Filed Vitals:   02/16/14 1528  BP: 118/80  Pulse: 69  Temp: 98.6 F (37 C)  Resp: 14   Results for orders placed in visit on 02/16/14  GLUCOSE, POCT (MANUAL RESULT ENTRY)      Result Value Ref Range   POC Glucose 380 (*) 70 - 99 mg/dl  POCT GLYCOSYLATED HEMOGLOBIN (HGB A1C)      Result Value Ref Range   Hemoglobin A1C >14.0         Assessment & Plan:  Patient diabetes out of control. He is getting up 3 times at night. His hemoglobin A1c is over 14. Referral made to endocrinology to get their help. I did increase his Glucophage to 1 g twice a day. At the present time he is resistant to checking his own sugars . I told him this will be essential in getting the control of his diabetes. I did give him Diflucan and a half for his yeast infection.

## 2014-02-16 NOTE — Patient Instructions (Signed)
Candida Infection, Adult A candida infection (also called yeast, fungus and Monilia infection) is an overgrowth of yeast that can occur anywhere on the body. A yeast infection commonly occurs in warm, moist body areas. Usually, the infection remains localized but can spread to become a systemic infection. A yeast infection may be a sign of a more severe disease such as diabetes, leukemia, or AIDS. A yeast infection can occur in both men and women. In women, Candida vaginitis is a vaginal infection. It is one of the most common causes of vaginitis. Men usually do not have symptoms or know they have an infection until other problems develop. Men may find out they have a yeast infection because their sex partner has a yeast infection. Uncircumcised men are more likely to get a yeast infection than circumcised men. This is because the uncircumcised glans is not exposed to air and does not remain as dry as that of a circumcised glans. Older adults may develop yeast infections around dentures. CAUSES  Women  Antibiotics.  Steroid medication taken for a long time.  Being overweight (obese).  Diabetes.  Poor immune condition.  Certain serious medical conditions.  Immune suppressive medications for organ transplant patients.  Chemotherapy.  Pregnancy.  Menstration.  Stress and fatigue.  Intravenous drug use.  Oral contraceptives.  Wearing tight-fitting clothes in the crotch area.  Catching it from a sex partner who has a yeast infection.  Spermicide.  Intravenous, urinary, or other catheters. Men  Catching it from a sex partner who has a yeast infection.  Having oral or anal sex with a person who has the infection.  Spermicide.  Diabetes.  Antibiotics.  Poor immune system.  Medications that suppress the immune system.  Intravenous drug use.  Intravenous, urinary, or other catheters. SYMPTOMS  Women  Thick, white vaginal discharge.  Vaginal itching.  Redness and  swelling in and around the vagina.  Irritation of the lips of the vagina and perineum.  Blisters on the vaginal lips and perineum.  Painful sexual intercourse.  Low blood sugar (hypoglycemia).  Painful urination.  Bladder infections.  Intestinal problems such as constipation, indigestion, bad breath, bloating, increase in gas, diarrhea, or loose stools. Men  Men may develop intestinal problems such as constipation, indigestion, bad breath, bloating, increase in gas, diarrhea, or loose stools.  Dry, cracked skin on the penis with itching or discomfort.  Jock itch.  Dry, flaky skin.  Athlete's foot.  Hypoglycemia. DIAGNOSIS  Women  A history and an exam are performed.  The discharge may be examined under a microscope.  A culture may be taken of the discharge. Men  A history and an exam are performed.  Any discharge from the penis or areas of cracked skin will be looked at under the microscope and cultured.  Stool samples may be cultured. TREATMENT  Women  Vaginal antifungal suppositories and creams.  Medicated creams to decrease irritation and itching on the outside of the vagina.  Warm compresses to the perineal area to decrease swelling and discomfort.  Oral antifungal medications.  Medicated vaginal suppositories or cream for repeated or recurrent infections.  Wash and dry the irritation areas before applying the cream.  Eating yogurt with lactobacillus may help with prevention and treatment.  Sometimes painting the vagina with gentian violet solution may help if creams and suppositories do not work. Men  Antifungal creams and oral antifungal medications.  Sometimes treatment must continue for 30 days after the symptoms go away to prevent recurrence. HOME CARE  INSTRUCTIONS  Women  Use cotton underwear and avoid tight-fitting clothing.  Avoid colored, scented toilet paper and deodorant tampons or pads.  Do not douche.  Keep your diabetes  under control.  Finish all the prescribed medications.  Keep your skin clean and dry.  Consume milk or yogurt with lactobacillus active culture regularly. If you get frequent yeast infections and think that is what the infection is, there are over-the-counter medications that you can get. If the infection does not show healing in 3 days, talk to your caregiver.  Tell your sex partner you have a yeast infection. Your partner may need treatment also, especially if your infection does not clear up or recurs. Men  Keep your skin clean and dry.  Keep your diabetes under control.  Finish all prescribed medications.  Tell your sex partner that you have a yeast infection so they can be treated if necessary. SEEK MEDICAL CARE IF:   Your symptoms do not clear up or worsen in one week after treatment.  You have an oral temperature above 102 F (38.9 C).  You have trouble swallowing or eating for a prolonged time.  You develop blisters on and around your vagina.  You develop vaginal bleeding and it is not your menstrual period.  You develop abdominal pain.  You develop intestinal problems as mentioned above.  You get weak or lightheaded.  You have painful or increased urination.  You have pain during sexual intercourse. MAKE SURE YOU:   Understand these instructions.  Will watch your condition.  Will get help right away if you are not doing well or get worse. Document Released: 09/17/2004 Document Revised: 11/02/2011 Document Reviewed: 12/30/2009 [facility] Patient Information 2015 [place], [place]. This information is not intended to replace advice given to you by your health care provider. Make sure you discuss any questions you have with your health care provider. Type 2 Diabetes Mellitus, Adult Type 2 diabetes mellitus, often simply referred to as type 2 diabetes, is a long-lasting (chronic) disease. In type 2 diabetes, the pancreas does not make enough insulin (a hormone), the  cells are less responsive to the insulin that is made (insulin resistance), or both. Normally, insulin moves sugars from food into the tissue cells. The tissue cells use the sugars for energy. The lack of insulin or the lack of normal response to insulin causes excess sugars to build up in the blood instead of going into the tissue cells. As a result, high blood sugar (hyperglycemia) develops. The effect of high sugar (glucose) levels can cause many complications. Type 2 diabetes was also previously called adult-onset diabetes but it can occur at any age.  RISK FACTORS  A person is predisposed to developing type 2 diabetes if someone in the family has the disease and also has one or more of the following primary risk factors:  Overweight.  An inactive lifestyle.  A history of consistently eating high-calorie foods. Maintaining a normal weight and regular physical activity can reduce the chance of developing type 2 diabetes. SYMPTOMS  A person with type 2 diabetes may not show symptoms initially. The symptoms of type 2 diabetes appear slowly. The symptoms include:  Increased thirst (polydipsia).  Increased urination (polyuria).  Increased urination during the night (nocturia).  Weight loss. This weight loss may be rapid.  Frequent, recurring infections.  Tiredness (fatigue).  Weakness.  Vision changes, such as blurred vision.  Fruity smell to your breath.  Abdominal pain.  Nausea or vomiting.  Cuts or bruises which  are slow to heal.  Tingling or numbness in the hands or feet. DIAGNOSIS Type 2 diabetes is frequently not diagnosed until complications of diabetes are present. Type 2 diabetes is diagnosed when symptoms or complications are present and when blood glucose levels are increased. Your blood glucose level may be checked by one or more of the following blood tests:  A fasting blood glucose test. You will not be allowed to eat for at least 8 hours before a blood  sample is taken.  A random blood glucose test. Your blood glucose is checked at any time of the day regardless of when you ate.  A hemoglobin A1c blood glucose test. A hemoglobin A1c test provides information about blood glucose control over the previous 3 months.  An oral glucose tolerance test (OGTT). Your blood glucose is measured after you have not eaten (fasted) for 2 hours and then after you drink a glucose-containing beverage. TREATMENT   You may need to take insulin or diabetes medicine daily to keep blood glucose levels in the desired range.  If you use insulin, you may need to adjust the dosage depending on the carbohydrates that you eat with each meal or snack. The treatment goal is to maintain the before meal blood sugar (preprandial glucose) level at 70-130 mg/dL. HOME CARE INSTRUCTIONS   Have your hemoglobin A1c level checked twice a year.  Perform daily blood glucose monitoring as directed by your health care provider.  Monitor urine ketones when you are ill and as directed by your health care provider.  Take your diabetes medicine or insulin as directed by your health care provider to maintain your blood glucose levels in the desired range.  Never run out of diabetes medicine or insulin. It is needed every day.  If you are using insulin, you may need to adjust the amount of insulin given based on your intake of carbohydrates. Carbohydrates can raise blood glucose levels but need to be included in your diet. Carbohydrates provide vitamins, minerals, and fiber which are an essential part of a healthy diet. Carbohydrates are found in fruits, vegetables, whole grains, dairy products, legumes, and foods containing added sugars.  Eat healthy foods. You should make an appointment to see a registered dietitian to help you create an eating plan that is right for you.  Lose weight if overweight.  Carry a medical alert card or wear your medical alert jewelry.  Carry a 15 gram  carbohydrate snack with you at all times to treat low blood glucose (hypoglycemia). Some examples of 15 gram carbohydrate snacks include:  Glucose tablets, 3 or 4  Raisins, 2 tablespoons (24 grams)  Jelly beans, 6  Animal crackers, 8  Regular pop, 4 ounces (120 mL)  Gummy treats, 9  Recognize hypoglycemia. Hypoglycemia occurs with blood glucose levels of 70 mg/dL and below. The risk for hypoglycemia increases when fasting or skipping meals, during or after intense exercise, and during sleep. Hypoglycemia symptoms can include:  Tremors or shakes.  Decreased ability to concentrate.  Sweating.  Increased heart rate.  Headache.  Dry mouth.  Hunger.  Irritability.  Anxiety.  Restless sleep.  Altered speech or coordination.  Confusion.  Treat hypoglycemia promptly. If you are alert and able to safely swallow, follow the 15:15 rule:  Take 15-20 grams of rapid-acting glucose or carbohydrate. Rapid-acting options include glucose gel, glucose tablets, or 4 ounces (120 mL) of fruit juice, regular soda, or low fat milk.  Check your blood glucose level 15 minutes after taking  the glucose.  Take 15-20 grams more of glucose if the repeat blood glucose level is still 70 mg/dL or below.  Eat a meal or snack within 1 hour once blood glucose levels return to normal.  Be alert to feeling very thirsty and urinating more frequently than usual, which are early signs of hyperglycemia. An early awareness of hyperglycemia allows for prompt treatment. Treat hyperglycemia as directed by your health care provider.  Engage in at least 150 minutes of moderate-intensity physical activity a week, spread over at least 3 days of the week or as directed by your health care provider. In addition, you should engage in resistance exercise at least 2 times a week or as directed by your health care provider.  Adjust your medicine and food intake as needed if you start a new exercise or  sport.  Follow your sick day plan at any time you are unable to eat or drink as usual.  Avoid tobacco use.  Limit alcohol intake to no more than 1 drink per day for nonpregnant women and 2 drinks per day for men. You should drink alcohol only when you are also eating food. Talk with your health care provider whether alcohol is safe for you. Tell your health care provider if you drink alcohol several times a week.  Follow up with your health care provider regularly.  Schedule an eye exam soon after the diagnosis of type 2 diabetes and then annually.  Perform daily skin and foot care. Examine your skin and feet daily for cuts, bruises, redness, nail problems, bleeding, blisters, or sores. A foot exam by a health care provider should be done annually.  Brush your teeth and gums at least twice a day and floss at least once a day. Follow up with your dentist regularly.  Share your diabetes management plan with your workplace or school.  Stay up-to-date with immunizations.  Learn to manage stress.  Obtain ongoing diabetes education and support as needed.  Participate in, or seek rehabilitation as needed to maintain or improve independence and quality of life. Request a physical or occupational therapy referral if you are having foot or hand numbness or difficulties with grooming, dressing, eating, or physical activity. SEEK MEDICAL CARE IF:   You are unable to eat food or drink fluids for more than 6 hours.  You have nausea and vomiting for more than 6 hours.  Your blood glucose level is over 240 mg/dL.  There is a change in mental status.  You develop an additional serious illness.  You have diarrhea for more than 6 hours.  You have been sick or have had a fever for a couple of days and are not getting better.  You have pain during any physical activity.  SEEK IMMEDIATE MEDICAL CARE IF:  You have difficulty breathing.  You have moderate to large ketone levels. MAKE SURE  YOU:  Understand these instructions.  Will watch your condition.  Will get help right away if you are not doing well or get worse. Document Released: 08/10/2005 Document Revised: 08/15/2013 Document Reviewed: 03/08/2012 [facility] Patient Information 2015 [place], [place]. This information is not intended to replace advice given to you by your health care provider. Make sure you discuss any questions you have with your health care provider.

## 2014-02-20 NOTE — Addendum Note (Signed)
Addended by: Constance Goltz on: 02/20/2014 02:52 PM   Modules accepted: Orders

## 2014-03-05 ENCOUNTER — Ambulatory Visit: Payer: 59 | Admitting: Internal Medicine

## 2014-03-23 ENCOUNTER — Encounter: Payer: Self-pay | Admitting: Internal Medicine

## 2014-03-23 ENCOUNTER — Other Ambulatory Visit (INDEPENDENT_AMBULATORY_CARE_PROVIDER_SITE_OTHER): Payer: 59 | Admitting: *Deleted

## 2014-03-23 ENCOUNTER — Ambulatory Visit (INDEPENDENT_AMBULATORY_CARE_PROVIDER_SITE_OTHER): Payer: 59 | Admitting: Internal Medicine

## 2014-03-23 VITALS — BP 134/98 | HR 90 | Temp 98.5°F | Resp 12 | Ht 66.0 in | Wt 174.0 lb

## 2014-03-23 DIAGNOSIS — E119 Type 2 diabetes mellitus without complications: Secondary | ICD-10-CM

## 2014-03-23 LAB — GLUCOSE, POCT (MANUAL RESULT ENTRY): POC GLUCOSE: 467 mg/dL — AB (ref 70–99)

## 2014-03-23 MED ORDER — INSULIN PEN NEEDLE 32G X 4 MM MISC: Status: DC

## 2014-03-23 MED ORDER — INSULIN GLARGINE 100 UNIT/ML SOLOSTAR PEN: 20.0000 [IU] | PEN_INJECTOR | Freq: Every day | SUBCUTANEOUS | Status: DC

## 2014-03-23 MED ORDER — GLUCOSE BLOOD VI STRP: ORAL_STRIP | Status: DC

## 2014-03-23 MED ORDER — ONETOUCH LANCETS MISC: Status: DC

## 2014-03-23 NOTE — Progress Notes (Signed)
Patient ID: Danny Vazquez, male   DOB: 06/08/1961, 53 y.o.   MRN: [id number]  HPI: Danny Vazquez is a 53 y.o.-year-old male, referred by his PCP, Dr. Daub, for management of DM2, non-insulin-dependent, uncontrolled, with complications (ED).  Patient has been diagnosed with diabetes in 06/2013; he has not been on insulin before.  His PCP recently suggested Insulin, but he refused.  He lost 30 lbs in the last 6 mo! This was partly intentions.  Last hemoglobin A1c was: Lab Results  Component Value Date   HGBA1C >14.0 02/16/2014   HGBA1C 13.1 06/30/2013   HGBA1C 6.2 09/26/2012   Pt is on a regimen of: - Metformin 1000 mg po bid (recently increased from 500 mg bid 1 mo ago)  Pt does not check his sugars. He refused to start checking sugars at last visit with PCP.  ?  lows; ? hypoglycemia awareness. ? Highs.  Pt's meals are: - Breakfast: 2-3 scrambled eggs, coffee + honey, oatmeal + honey - Lunch: salad + coffee/tea, chinese buffet, [employer] - Dinner: fish stew + rice, steak + broccoli or cabbage, okra soup - Snacks: mostly fruit He uses honey in coffee and also fresh fruit. No sodas, no sugary sweets. Occasionally pie. He restarted to go to go to the gym: Tai Chi - at least 2x a week; will start swimming.  - has mild CKD, last BUN/creatinine:  Lab Results  Component Value Date   BUN 15 02/16/2014   CREATININE 1.13 02/16/2014  Not on an ACEI. - He has HL. Last set of lipids: Lab Results  Component Value Date   CHOL 207* 06/30/2013   HDL 36* 06/30/2013   LDLCALC 136* 06/30/2013   TRIG 175* 06/30/2013   CHOLHDL 5.8 06/30/2013  He is on Crestor x last 3-5 years. - last eye exam was 5 years ago. No DR.  - no numbness and tingling in his feet.  Pt has FH of DM in mother.  He also has a h/o HTN, gout, OSA, depression.  ROS: Constitutional: + weight loss, + fatigue, no subjective hyperthermia/hypothermia, + nocturia >2x a night Eyes: + blurry vision, no xerophthalmia ENT: + sore throat, no  nodules palpated in throat, no dysphagia/odynophagia, no hoarseness Cardiovascular: no CP/SOB/palpitations/leg swelling Respiratory: no cough/SOB Gastrointestinal: no N/V/D/C Musculoskeletal: no muscle/joint aches Skin: no rashes Neurological: no tremors/numbness/tingling/dizziness Psychiatric: no depression/anxiety  Past Medical History  Diagnosis Date  . Allergy   . Depression   . Diabetes mellitus without complication     Diet controlled  . HTN (hypertension)    Past Surgical History  Procedure Laterality Date  . None     History   Social History  . Marital Status: Married    Spouse Name: N/A    Number of Children: 4   Occupational History  . interpreter    Social History Main Topics  . Smoking status: Never Smoker   . Smokeless tobacco: No  . Alcohol Use: Occasional  . Drug Use: No   Social History Narrative   Lives with wife.m     Current Outpatient Prescriptions on File Prior to Visit  Medication Sig Dispense Refill  . cloNIDine (CATAPRES) 0.1 MG tablet Take 1 tablet (0.1 mg total) by mouth 2 (two) times daily.  60 tablet  0  . CRESTOR 10 MG tablet Take 1 tablet (10 mg total) by mouth daily.  30 tablet  0  . losartan (COZAAR) 100 MG tablet Take 1 tablet (100 mg total) by mouth daily.  30 tablet    11  . metFORMIN (GLUCOPHAGE) 1000 MG tablet Take 1 tablet (1,000 mg total) by mouth 2 (two) times daily with a meal.  180 tablet  3  . allopurinol (ZYLOPRIM) 300 MG tablet Take 1 tablet (300 mg total) by mouth daily.  30 tablet  11  . fluconazole (DIFLUCAN) 150 MG tablet Take 1 tablet (150 mg total) by mouth once. Repeat if needed  2 tablet  0  . hydrochlorothiazide (HYDRODIURIL) 25 MG tablet Take 1 tablet (25 mg total) by mouth daily.  30 tablet  11  . indomethacin (INDOCIN) 25 MG capsule This medicine is to be used for acute flareups of gout. It is not to be taken on a regular basis  60 capsule  3   No current facility-administered medications on file prior to visit.    Allergies  Allergen Reactions  . Penicillins     REACTION: swelling and itching   Family History  Problem Relation Age of Onset  . Diabetes Mother   . Heart disease Father     CHF   PE: BP 134/98  Pulse 90  Temp(Src) 98.5 F (36.9 C) (Oral)  Resp 12  Ht 5' 6" (1.676 m)  Wt 174 lb (78.926 kg)  BMI 28.10 kg/m2  SpO2 95% Wt Readings from Last 3 Encounters:  03/23/14 174 lb (78.926 kg)  08/23/13 207 lb (93.895 kg)  07/04/13 201 lb 3.2 oz (91.264 kg)   Constitutional: slightly overweight, in NAD Eyes: PERRLA, EOMI, no exophthalmos ENT: moist mucous membranes, no thyromegaly, no cervical lymphadenopathy Cardiovascular: RRR, No MRG Respiratory: CTA B Gastrointestinal: abdomen soft, NT, ND, BS+ Musculoskeletal: no deformities, strength intact in all 4 Skin: moist, warm, no rashes Neurological: no tremor with outstretched hands, DTR normal in all 4  ASSESSMENT: 1. DM2, non-insulin-dependent, uncontrolled, with complications - ED  PLAN:  1. Patient with ~1 year h/o uncontrolled diabetes, on oral antidiabetic regimen, which is insufficient. His Metformin dose was increased to target dose (1000 mg bid) at last visit his PCP, but we are not sure of improved control after this, as he is not checking his sugars. CBG in the office 467 (after black coffee). I explained that he is very glucotoxic for now and the only med that can help for now is insulin. In this context, he agrees to start basal insulin, which would hopefully be a provisional med for him.  -I suggested to:  Patient Instructions  Please continue Metformin 1000 mg 2x a day, with meals. Please start Lantus 20 units at bedtime. Please return in 2 weeks with your sugar log.  - discussed nutrition (he also has an appt with nutritionist next month) - discussed to avoid chocolate and look at the website glycemicindex.com and use the fruit with lower glycemic load for now - Strongly advised him to start checking sugars at  different times of the day - check 1-2 tiimes a day, rotating checks - given a OneTouch Verio meter - given sugar log and advised how to fill it and to bring it at next appt  - given foot care handout and explained the principles  - given instructions for hypoglycemia management "15-15 rule"  - advised for yearly eye exams >> he is due - Return to clinic in 1 mo with sugar log

## 2014-03-23 NOTE — Patient Instructions (Signed)
Please continue Metformin 1000 mg 2x a day, with meals. Please start Lantus 20 units at bedtime.  Please return in 2 weeks with your sugar log.   PATIENT INSTRUCTIONS FOR TYPE 2 DIABETES:  **Please join MyChart!** - see attached instructions about how to join if you have not done so already.  DIET AND EXERCISE Diet and exercise is an important part of diabetic treatment.  We recommended aerobic exercise in the form of brisk walking (working between 40-60% of maximal aerobic capacity, similar to brisk walking) for 150 minutes per week (such as 30 minutes five days per week) along with 3 times per week performing 'resistance' training (using various gauge rubber tubes with handles) 5-10 exercises involving the major muscle groups (upper body, lower body and core) performing 10-15 repetitions (or near fatigue) each exercise. Start at half the above goal but build slowly to reach the above goals. If limited by weight, joint pain, or disability, we recommend daily walking in a swimming pool with water up to waist to reduce pressure from joints while allow for adequate exercise.    BLOOD GLUCOSES Monitoring your blood glucoses is important for continued management of your diabetes. Please check your blood glucoses 2-4 times a day: fasting, before meals and at bedtime (you can rotate these measurements - e.g. one day check before the 3 meals, the next day check before 2 of the meals and before bedtime, etc.).   HYPOGLYCEMIA (low blood sugar) Hypoglycemia is usually a reaction to not eating, exercising, or taking too much insulin/ other diabetes drugs.  Symptoms include tremors, sweating, hunger, confusion, headache, etc. Treat IMMEDIATELY with 15 grams of Carbs:   4 glucose tablets    cup regular juice/soda   2 tablespoons raisins   4 teaspoons sugar   1 tablespoon honey Recheck blood glucose in 15 mins and repeat above if still symptomatic/blood glucose <100.  RECOMMENDATIONS TO REDUCE YOUR  RISK OF DIABETIC COMPLICATIONS: * Take your prescribed MEDICATION(S) * Follow a DIABETIC diet: Complex carbs, fiber rich foods, (monounsaturated and polyunsaturated) fats * AVOID saturated/trans fats, high fat foods, >2,300 mg salt per day. * EXERCISE at least 5 times a week for 30 minutes or preferably daily.  * DO NOT SMOKE OR DRINK more than 1 drink a day. * Check your FEET every day. Do not wear tightfitting shoes. Contact us if you develop an ulcer * See your EYE doctor once a year or more if needed * Get a FLU shot once a year * Get a PNEUMONIA vaccine once before and once after age 26 years  GOALS:  * Your Hemoglobin A1c of <7%  * fasting sugars need to be <130 * after meals sugars need to be <180 (2h after you start eating) * Your Systolic BP should be 761 or lower  * Your Diastolic BP should be 80 or lower  * Your HDL (Good Cholesterol) should be 40 or higher  * Your LDL (Bad Cholesterol) should be 100 or lower. * Your Triglycerides should be 150 or lower  * Your Urine microalbumin (kidney function) should be <30 * Your Body Mass Index should be 25 or lower   We will be glad to help you achieve these goals. Our telephone number is: [phone].

## 2014-03-28 ENCOUNTER — Encounter: Payer: Self-pay | Admitting: Gastroenterology

## 2014-04-04 ENCOUNTER — Encounter: Payer: Self-pay | Admitting: *Deleted

## 2014-04-04 ENCOUNTER — Encounter: Payer: 59 | Attending: Emergency Medicine | Admitting: *Deleted

## 2014-04-04 VITALS — Ht 66.0 in | Wt 184.2 lb

## 2014-04-04 DIAGNOSIS — Z713 Dietary counseling and surveillance: Secondary | ICD-10-CM | POA: Insufficient documentation

## 2014-04-04 DIAGNOSIS — E119 Type 2 diabetes mellitus without complications: Secondary | ICD-10-CM | POA: Diagnosis not present

## 2014-04-04 DIAGNOSIS — Z794 Long term (current) use of insulin: Secondary | ICD-10-CM | POA: Diagnosis not present

## 2014-04-04 NOTE — Patient Instructions (Signed)
Plan:  Aim for 3 Carb Choices per meal (45 grams) +/- 1 either way  Aim for 0-1 Carbs per snack if hungry  Include lean protein with meals and snacks in moderation  Consider reading food labels for Total Carbohydrate of foods Continue wth your activity level by exercising 60-90 minutes 3 days a week as tolerated Continue checking your BG daily and consider alternate times of day so you can capture pre and post meal information.

## 2014-04-04 NOTE — Progress Notes (Signed)
Appt start time: 0900 end time:  1000.  Assessment:  Patient was seen on  04/04/14 for individual diabetes education. Patient has been here before in 2014 for diabetes education. He states he had not started checking his BG last year, his A1c climbed to >14% with multiple symptoms including polyuria, polydipsia and fatigue. He has now been to see endocrinologist, Dr. Cruzita Lederer, has initiated Lantus indulin and is SMBG 2-3 times a day with logged BG now in the 140 mg/dl range! He has cut out many of the higher carb containing foods and beverages and he is exercising 3 times each week for almost 90 minutes! Symptoms have subsided and he feels much better.  Patient Education Plan per assessed needs and concerns is to attend individual session for Diabetes Self Management Education.  Current HbA1c: >14%  Preferred Learning Style:   No preference indicated   Learning Readiness:   Ready  Change in progress  MEDICATIONS: see list, diabetes medications are now Metformin and Lantus @ 20 units in evening  DIETARY INTAKE:  24-hr recall:  B ( AM): skips occasionally - 2-3 eggs, lean meat sauteed with onions and green peppers, 1 slice of bread or occasionally 1 cup oatmeal with evaporated milk,  coffee - black  Snk ( AM): no  L ( PM): cooks own meal- 6" tuna sub, water  Snk ( PM): sunflower seeds or peanuts, occasionally raisins or dates D ( PM): lean meat, rice or pasta, vegetables, (mostly dark green), occasionally a salad, water or unsweetened flavored tea Snk ( PM): fresh fruit or peanuts Beverages: water, black coffee, unsweetened tea  Usual physical activity: going to gym twice a week or more for 1 1/2 hours  Estimated energy needs: 1400 calories 158 g carbohydrates 105 g protein 39 g fat  Progress Towards Goal(s):  In progress.   Nutritional Diagnosis:  NB-1.1 Food and nutrition-related knowledge deficit As related to diabetes control.  As evidenced by A1c of > 14%.     Intervention:  Nutrition counseling provided.  Discussed diabetes disease process and treatment options.  Discussed physiology of diabetes and role of obesity on insulin resistance.  Encouraged moderate weight reduction to improve glucose levels.  Discussed role of medications and diet in glucose control  Provided education on macronutrients on glucose levels.  Provided education on carb counting, importance of regularly scheduled meals/snacks, and meal planning  Discussed effects of physical activity on glucose levels and long-term glucose control.  Recommended 150 minutes of physical activity/week.  Reviewed patient medications.  Discussed role of medication on blood glucose and possible side effects  Discussed blood glucose monitoring and interpretation.  Discussed recommended target ranges and individual ranges.    Described short-term complications: hyper- and hypo-glycemia.  Discussed causes,symptoms, and treatment options.  Discussed prevention, detection, and treatment of long-term complications.  Discussed the role of prolonged elevated glucose levels on body systems.  Discussed role of stress on blood glucose levels and discussed strategies to manage psychosocial issues.  Discussed recommendations for long-term diabetes self-care.  Established checklist for medical, dental, and emotional self-care.  Plan:  Aim for 3 Carb Choices per meal (45 grams) +/- 1 either way  Aim for 0-1 Carbs per snack if hungry  Include lean protein with meals and snacks in moderation  Consider reading food labels for Total Carbohydrate of foods Continue wth your activity level by exercising 60-90 minutes 3 days a week as tolerated Continue checking your BG daily and consider alternate times of day so you  can capture pre and post meal information.   Teaching Method Utilized: Visual and Auditory  Handouts given during visit include: Carb Counting and Food Label handouts Meal Plan Card  Barriers  to learning/adherence to lifestyle change: none  Diabetes self-care support plan:   [facility] support group  Demonstrated degree of understanding via:  Teach Back   Monitoring/Evaluation:  Dietary intake, exercise, SMBG, reading food labels, and body weight prn.

## 2014-04-05 ENCOUNTER — Ambulatory Visit: Payer: 59 | Admitting: Internal Medicine

## 2014-04-20 ENCOUNTER — Other Ambulatory Visit: Payer: Self-pay | Admitting: Emergency Medicine

## 2014-05-16 ENCOUNTER — Ambulatory Visit (INDEPENDENT_AMBULATORY_CARE_PROVIDER_SITE_OTHER): Payer: 59 | Admitting: Emergency Medicine

## 2014-05-16 ENCOUNTER — Ambulatory Visit (INDEPENDENT_AMBULATORY_CARE_PROVIDER_SITE_OTHER): Payer: 59

## 2014-05-16 ENCOUNTER — Telehealth: Payer: Self-pay | Admitting: *Deleted

## 2014-05-16 VITALS — BP 142/92 | HR 72 | Temp 97.9°F | Resp 16 | Ht 66.25 in | Wt 196.2 lb

## 2014-05-16 DIAGNOSIS — Z111 Encounter for screening for respiratory tuberculosis: Secondary | ICD-10-CM

## 2014-05-16 DIAGNOSIS — Z7184 Encounter for health counseling related to travel: Secondary | ICD-10-CM

## 2014-05-16 DIAGNOSIS — Z7189 Other specified counseling: Secondary | ICD-10-CM

## 2014-05-16 DIAGNOSIS — Z23 Encounter for immunization: Secondary | ICD-10-CM

## 2014-05-16 MED ORDER — ATOVAQUONE-PROGUANIL HCL 250-100 MG PO TABS
1.0000 | ORAL_TABLET | Freq: Every day | ORAL | Status: DC
Start: 2014-05-16 — End: 2021-03-13

## 2014-05-16 MED ORDER — TYPHOID VACCINE PO CPDR: 1.0000 | DELAYED_RELEASE_CAPSULE | ORAL | Status: DC

## 2014-05-16 NOTE — Patient Instructions (Signed)
Atovaquone; Proguanil tablets What is this medicine? ATOVAQUONE; PROGUANIL (a TOE va kwone; pro GWAN il) is an antimalarial agent. It is used to prevent and to treat malaria infections. This medicine may be used for other purposes; ask your health care provider or pharmacist if you have questions. COMMON BRAND NAME(S): Malarone, Malarone Pediatric What should I tell my health care provider before I take this medicine? They need to know if you have any of these conditions: -kidney disease -liver disease -stomach problems -an unusual or allergic reaction to atovaquone, proguanil, other medicines, foods, dyes, or preservatives -pregnant or trying to get pregnant -breast-feeding How should I use this medicine? Take this medicine by mouth with a glass of water. Follow the directions on the prescription label. Take this medicine at the same time each day with food or a milky drink. The tablets may be crushed and mixed with condensed milk just before the dose. If you vomit within 1 hour after taking your dose, take your dose again. Take all of your medicine as directed even if you think you are better. Do not skip doses or stop your medicine early. To prevent malaria, take this medicine daily starting 1 or 2 days before entering the area, and continue for 7 days after leaving. Talk to your pediatrician regarding the use of this medicine in children. While this drug may be prescribed for children for selected conditions, precautions do apply. Overdosage: If you think you have taken too much of this medicine contact a poison control center or emergency room at once. NOTE: This medicine is only for you. Do not share this medicine with others. What if I miss a dose? If you miss a dose, take it as soon as you can. If it is almost time for your next dose, take only that dose. Do not take double or extra doses. What may interact with this medicine? -metoclopramide -rifabutin -rifampin -tetracycline This  list may not describe all possible interactions. Give your health care provider a list of all the medicines, herbs, non-prescription drugs, or dietary supplements you use. Also tell them if you smoke, drink alcohol, or use illegal drugs. Some items may interact with your medicine. What should I watch for while using this medicine? If you get a fever during or after you are in a malaria-endemic area, call your doctor. Tell your doctor that you may have been exposed to malaria. This medicine can make you more sensitive to the sun. Keep out of the sun. If you cannot avoid being in the sun, wear protective clothing and use sunscreen. Do not use sun lamps or tanning beds/booths. While in areas where malaria is common, take steps to prevent mosquito bites. -Stay in air-conditioned or well-screened rooms to reduce human-mosquito contact. -Sleep under mosquito netting, preferably one with pyrethrum-containing insecticide. -Wear long-sleeved shirts or blouses and long trousers to protect arms and legs. -Apply mosquito repellents containing DEET to uncovered areas of skin. -Use a pyrethrum-containing flying insect spray to kill mosquitoes. What side effects may I notice from receiving this medicine? Side effects that you should report to your doctor or health care professional as soon as possible: -allergic reactions like skin rash, itching or hives, swelling of the face, lips, or tongue -breathing problems -changes in vision -fever or infection -redness, blistering, peeling or loosening of the skin, including inside the mouth -unusually weak or tired Side effects that usually do not require medical attention (report to your doctor or health care professional if they continue or  are bothersome): -cough -diarrhea -dizziness -headache -loss of appetite -nausea, vomiting -stomach pain -trouble sleeping This list may not describe all possible side effects. Call your doctor for medical advice about side  effects. You may report side effects to FDA at 1-800-FDA-1088. Where should I keep my medicine? Keep out of the reach of children. Store at room temperature between 15 and 30 degrees C (59 and 86 degrees F). Throw away any unused medicine after the expiration date. NOTE: This sheet is a summary. It may not cover all possible information. If you have questions about this medicine, talk to your doctor, pharmacist, or health care provider.  2015, Elsevier/Gold Standard. (2011-08-07 11:42:19)

## 2014-05-16 NOTE — Progress Notes (Signed)
Tuberculosis Risk Questionnaire  1. Yes- [place], [place] Were you born outside the [place] in one of the following parts of the world: [place], [place], [place], [place] or [place]?    2. Yes- Have you traveled outside the [place] and lived for more than one month in one of the following parts of the world: [place], [place], [place], [place] or [place]?    3. Yes- Diabetes Do you have a compromised immune system such as from any of the following conditions:HIV/AIDS, organ or bone marrow transplantation, diabetes, immunosuppressive medicines (e.g. Prednisone, Remicaide), leukemia, lymphoma, cancer of the head or neck, gastrectomy or jejunal bypass, end-stage renal disease (on dialysis), or silicosis?     4. Yes- Employed at [employer] Have you ever or do you plan on working in: a residential care center, a health care facility, a jail or prison or homeless shelter?    5. No Have you ever: injected illegal drugs, used crack cocaine, lived in a homeless shelter  or been in jail or prison?     6. Yes- Have you ever been exposed to anyone with infectious tuberculosis?    Tuberculosis Symptom Questionnaire  Do you currently have any of the following symptoms?  1. No Unexplained cough lasting more than 3 weeks?   2. No Unexplained fever lasting more than 3 weeks.   3. No Night Sweats (sweating that leaves the bedclothes and sheets wet)     4. No Shortness of Breath   5. No Chest Pain   6. No Unintentional weight loss    7. No Unexplained fatigue (very tired for no reason)   [facility] [address], [place] [address] [phone]  Date:  05/16/2014   Name:  Danny Vazquez   DOB:  07/11/1961   MRN:  [id number]  PCP:  Jenny Reichmann, MD    Chief Complaint: Flu Vaccine, PPD Placement and Immunizations   History of Present Illness:  Danny Vazquez is a 53 y.o. very pleasant male patient who presents with the  following:  Travelling to [place] in November.  Requests YF and TB screening.  Explained we do not provide YF vaccination Is a PPD converter and was treated with INH Currently asymptomatic No improvement with over the counter medications or other home remedies.  Denies other complaint or health concern today.   Patient Active Problem List   Diagnosis Date Noted  . Abnormal EKG 10/04/2012  . COLONIC POLYPS, ADENOMATOUS, BENIGN 04/21/2010  . VENTRICULAR HYPERTROPHY, LEFT 03/26/2010  . SLEEP APNEA 02/12/2010  . URINALYSIS, ABNORMAL 02/10/2010  . PHARYNGITIS 12/24/2009  . GOUT, UNSPECIFIED 12/12/2009  . DIABETES MELLITUS, TYPE II 11/12/2009  . TINEA VERSICOLOR 10/28/2009  . LUMBAGO 10/28/2009  . SNORING 10/28/2009  . ELECTROCARDIOGRAM, ABNORMAL 10/28/2009  . PENILE LESION 03/18/2009  . WRIST PAIN, LEFT 03/18/2009  . KNEE PAIN, BILATERAL 03/18/2009  . DEPRESSION 06/13/2008  . ROTATOR CUFF SYNDROME, LEFT 06/13/2008  . OTHER ABNORMAL GLUCOSE 10/15/2007  . VIRAL INFECTION, ACUTE 09/27/2007  . PTERYGIUM, BILATERAL 05/08/2007  . ERECTILE DYSFUNCTION, SECONDARY TO MEDICATION 05/08/2007  . HYPERLIPIDEMIA 04/21/2007  . HYPERTENSION 04/21/2007  . ALLERGIC RHINITIS, SEASONAL 04/21/2007    Past Medical History  Diagnosis Date  . Allergy   . Depression   . Diabetes mellitus without complication     Diet controlled  . HTN (hypertension)     Past Surgical History  Procedure Laterality Date  . None  History  Substance Use Topics  . Smoking status: Never Smoker   . Smokeless tobacco: Not on file  . Alcohol Use: Not on file    Family History  Problem Relation Age of Onset  . Diabetes Mother   . Heart disease Father     CHF    Allergies  Allergen Reactions  . Food     pork  . Penicillins     REACTION: swelling and itching    Medication list has been reviewed and updated.  Current Outpatient Prescriptions on File Prior to Visit  Medication Sig Dispense Refill  .  allopurinol (ZYLOPRIM) 300 MG tablet Take 1 tablet (300 mg total) by mouth daily.  30 tablet  11  . cloNIDine (CATAPRES) 0.1 MG tablet Take 1 tablet (0.1 mg total) by mouth 2 (two) times daily.  60 tablet  0  . CRESTOR 10 MG tablet TAKE 1 TABLET BY MOUTH ONCE DAILY  30 tablet  0  . fluconazole (DIFLUCAN) 150 MG tablet Take 1 tablet (150 mg total) by mouth once. Repeat if needed  2 tablet  0  . glucose blood (ONETOUCH VERIO) test strip Use 2x a day  200 each  3  . hydrochlorothiazide (HYDRODIURIL) 25 MG tablet Take 1 tablet (25 mg total) by mouth daily.  30 tablet  11  . indomethacin (INDOCIN) 25 MG capsule This medicine is to be used for acute flareups of gout. It is not to be taken on a regular basis  60 capsule  3  . Insulin Glargine (LANTUS SOLOSTAR) 100 UNIT/ML Solostar Pen Inject 20 Units into the skin at bedtime.  3 pen  11  . Insulin Pen Needle (CAREFINE PEN NEEDLES) 32G X 4 MM MISC Use once a day  100 each  5  . losartan (COZAAR) 100 MG tablet Take 1 tablet (100 mg total) by mouth daily.  30 tablet  11  . metFORMIN (GLUCOPHAGE) 1000 MG tablet Take 1 tablet (1,000 mg total) by mouth 2 (two) times daily with a meal.  180 tablet  3  . ONE TOUCH LANCETS MISC Use 2x a day  200 each  3   No current facility-administered medications on file prior to visit.    Review of Systems:  As per HPI, otherwise negative.    Physical Examination: Filed Vitals:   05/16/14 1759  BP: 142/92  Pulse: 72  Temp: 97.9 F (36.6 C)  Resp: 16   Filed Vitals:   05/16/14 1759  Height: 5' 6.25" (1.683 m)  Weight: 196 lb 3.2 oz (88.996 kg)   Body mass index is 31.42 kg/(m^2). Ideal Body Weight: Weight in (lb) to have BMI = 25: 155.7   GEN: WDWN, NAD, Non-toxic, Alert & Oriented x 3 HEENT: Atraumatic, Normocephalic.  Ears and Nose: No external deformity. EXTR: No clubbing/cyanosis/edema NEURO: Normal gait.  PSYCH: Normally interactive. Conversant. Not depressed or anxious appearing.  Calm demeanor.     Assessment and Plan: TB screening Travel Typhoid malarone  Signed,  Ellison Carwin, MD   [facility] reading (PRIMARY) by  Dr. Ouida Sills . Negative chest.

## 2014-05-16 NOTE — Telephone Encounter (Signed)
Pt needs TB screening clearance for employer. Dr. Ouida Sills requested that he waits for documentation until after Radiologist reviews his chest x-ray. He will come back tomorrow to pick-up the Radiologist's report.

## 2014-05-21 ENCOUNTER — Encounter: Payer: Self-pay | Admitting: Internal Medicine

## 2014-05-21 ENCOUNTER — Other Ambulatory Visit: Payer: Self-pay | Admitting: Internal Medicine

## 2014-05-21 ENCOUNTER — Ambulatory Visit (INDEPENDENT_AMBULATORY_CARE_PROVIDER_SITE_OTHER): Payer: 59 | Admitting: Internal Medicine

## 2014-05-21 VITALS — BP 132/78 | HR 84 | Temp 97.9°F | Ht 66.5 in | Wt 201.0 lb

## 2014-05-21 DIAGNOSIS — E119 Type 2 diabetes mellitus without complications: Secondary | ICD-10-CM

## 2014-05-21 LAB — HEMOGLOBIN A1C
HEMOGLOBIN A1C: 10.9 % — AB (ref <5.7)
MEAN PLASMA GLUCOSE: 266 mg/dL — AB (ref <117)

## 2014-05-21 MED ORDER — INSULIN GLARGINE 100 UNIT/ML SOLOSTAR PEN: 20.0000 [IU] | PEN_INJECTOR | Freq: Every day | SUBCUTANEOUS | Status: DC

## 2014-05-21 MED ORDER — SITAGLIPTIN PHOSPHATE 100 MG PO TABS: 100.0000 mg | ORAL_TABLET | Freq: Every day | ORAL | Status: DC

## 2014-05-21 NOTE — Patient Instructions (Signed)
Please increase Lantus to 23 units at bedtime. Continue Metformin 1000 mg 2x a day with meals. Add Januvia 100 mg in am.  Please stop at the lab [facility]).  Please return in 1.5 month with your sugar log.

## 2014-05-21 NOTE — Progress Notes (Signed)
Patient ID: Danny Vazquez, male   DOB: 03/10/1961, 53 y.o.   MRN: [id number]  HPI: Danny Vazquez is a 53 y.o.-year-old male, dx 11/2014DM2, non-insulin-dependent, uncontrolled, with complications (ED).  Last hemoglobin A1c was: Lab Results  Component Value Date   HGBA1C >14.0 02/16/2014   HGBA1C 13.1 06/30/2013   HGBA1C 6.2 09/26/2012   Pt is on a regimen of: - Metformin 1000 mg po bid  - Lantus 20 units in hs (started 02/2014)  Pt started to check his sugars 1-2x a day:  - am: 131-180 - 2h after b'fast: 127, 242, 341 - Lunch: 157, 168 - 2h after lunch: n/c - dinner: 90-153, 173 - 2h after dinner: 179, 218 - Bedtime: 112-177  Pt's meals are: - Breakfast: 2-3 scrambled eggs, coffee + honey, oatmeal + honey - Lunch: salad + coffee/tea, chinese buffet, [employer] - Dinner: fish stew + rice, steak + broccoli or cabbage, okra soup - Snacks: mostly fruit No sodas, no sugary sweets. Occasionally pie. He restarted to go to go to the gym: Tai Chi - at least 2x a week.  - has mild CKD, last BUN/creatinine:  Lab Results  Component Value Date   BUN 15 02/16/2014   CREATININE 1.13 02/16/2014  Not on an ACEI. - He has HL. Last set of lipids: Lab Results  Component Value Date   CHOL 207* 06/30/2013   HDL 36* 06/30/2013   LDLCALC 136* 06/30/2013   TRIG 175* 06/30/2013   CHOLHDL 5.8 06/30/2013  He is on Crestor. - last eye exam was in 03/2014. No DR.  - no numbness and tingling in his feet.  He also has a h/o HTN, gout, OSA, depression.  ROS: Constitutional: + weight gain, no fatigue, no subjective hyperthermia/hypothermia Eyes: no blurry vision, no xerophthalmia ENT: no sore throat, no nodules palpated in throat, no dysphagia/odynophagia, no hoarseness Cardiovascular: no CP/SOB/palpitations/leg swelling Respiratory: no cough/SOB Gastrointestinal: no N/V/D/C Musculoskeletal: no muscle/joint aches Skin: no rashes Neurological: no tremors/numbness/tingling/dizziness  I reviewed pt's  medications, allergies, PMH, social hx, family hx and no changes required, except as mentioned above.  PE: BP 132/78  Pulse 84  Temp(Src) 97.9 F (36.6 C) (Oral)  Ht 5' 6.5" (1.689 m)  Wt 201 lb (91.173 kg)  BMI 31.96 kg/m2  SpO2 95% Wt Readings from Last 3 Encounters:  05/21/14 201 lb (91.173 kg)  05/16/14 196 lb 3.2 oz (88.996 kg)  04/04/14 184 lb 3.2 oz (83.553 kg)   Constitutional: slightly overweight, in NAD Eyes: PERRLA, EOMI, no exophthalmos ENT: moist mucous membranes, no thyromegaly, no cervical lymphadenopathy Cardiovascular: RRR, No MRG Respiratory: CTA B Gastrointestinal: abdomen soft, NT, ND, BS+ Musculoskeletal: no deformities, strength intact in all 4 Skin: moist, warm, no rashes Neurological: no tremor with outstretched hands, DTR normal in all 4  ASSESSMENT: 1. DM2, non-insulin-dependent, uncontrolled, with complications - ED  PLAN:  1. Patient with ~1 year h/o uncontrolled diabetes, on oral antidiabetic regimen + basal insulin added at last visit. Sugars much improved, not yet at goal, but close. -I suggested to:  Patient Instructions  Please increase Lantus to 23 units at bedtime. Continue Metformin 1000 mg 2x a day with meals. Add Januvia 100 mg in am. Please stop at the lab ([facility]). Please return in 1.5 month with your sugar log.  - saw nutrition  - continue checking sugars at different times of the day - check 1-2 times a day, rotating checks - got the flu vaccine this season - up to date with yearly eye   exams  - will check HbA1c today - Return to clinic in 1.5 mo with sugar log   Orders Only on 05/21/2014  Component Date Value Ref Range Status  . Hemoglobin A1C 05/21/2014 10.9* <5.7 % Final   Comment:                                                                                                 According to the ADA Clinical Practice Recommendations for 2011, when                          HbA1c is used as a screening test:                                                        >=6.5%   Diagnostic of Diabetes Mellitus                                     (if abnormal result is confirmed)                                                     5.7-6.4%   Increased risk of developing Diabetes Mellitus                                                     References:Diagnosis and Classification of Diabetes Mellitus,Diabetes                          Care,2011,34(Suppl 1):S62-S69 and Standards of Medical Care in                                  Diabetes - 2011,Diabetes Care,2011,34 (Suppl 1):S11-S61.                             . Mean Plasma Glucose 05/21/2014 266* <117 mg/dL Final   Improved HbA1c! I believe the effect of adding insulin will be better seen at next HbA1c reading. 

## 2014-05-23 ENCOUNTER — Encounter: Payer: Self-pay | Admitting: *Deleted

## 2014-05-30 ENCOUNTER — Other Ambulatory Visit: Payer: Self-pay | Admitting: Emergency Medicine

## 2014-06-29 ENCOUNTER — Ambulatory Visit (INDEPENDENT_AMBULATORY_CARE_PROVIDER_SITE_OTHER): Payer: 59 | Admitting: Internal Medicine

## 2014-06-29 ENCOUNTER — Encounter: Payer: Self-pay | Admitting: Internal Medicine

## 2014-06-29 VITALS — BP 130/82 | HR 77 | Temp 97.9°F | Wt 204.2 lb

## 2014-06-29 DIAGNOSIS — E1165 Type 2 diabetes mellitus with hyperglycemia: Secondary | ICD-10-CM

## 2014-06-29 DIAGNOSIS — E785 Hyperlipidemia, unspecified: Secondary | ICD-10-CM

## 2014-06-29 DIAGNOSIS — Z794 Long term (current) use of insulin: Secondary | ICD-10-CM

## 2014-06-29 DIAGNOSIS — IMO0002 Reserved for concepts with insufficient information to code with codable children: Secondary | ICD-10-CM

## 2014-06-29 MED ORDER — CRESTOR 10 MG PO TABS: ORAL_TABLET | ORAL | Status: DC

## 2014-06-29 MED ORDER — GLIPIZIDE 5 MG PO TABS: ORAL_TABLET | ORAL | Status: DC

## 2014-06-29 NOTE — Patient Instructions (Signed)
Patient Instructions  Please continue Lantus 23 units at bedtime. Continue Metformin 1000 mg 2x a day with meals. Continue Januvia 100 mg in am. Please take Glipizide 5 mg before dinner if you have a large meal. Please stop at the lab Please return in 3 months with your sugar log.

## 2014-06-29 NOTE — Progress Notes (Signed)
Patient ID: Danny Vazquez, male   DOB: 01-Dec-1960, 53 y.o.   MRN: [id number]  HPI: Danny Vazquez is a 53 y.o.-year-old male, returning for f/u for DM2, dx 06/2013, insulin-dependent, uncontrolled, with complications (ED).  Pt will travel to [place] for 45 days >> will return on 08/23/2014.  Last hemoglobin A1c was: Lab Results  Component Value Date   HGBA1C 10.9* 05/21/2014   HGBA1C >14.0 02/16/2014   HGBA1C 13.1 06/30/2013   Pt is on a regimen of: - Metformin 1000 mg po bid  - Lantus 20 >> 23 units in hs (started 02/2014) - Januvia 100 mg in am (started 04/2014)  Pt started to check his sugars 1-2x a day >> improved:  - am: 131-180 >> 91-149 - 2h after b'fast: 127, 242, 341 >> n/c - Lunch: 157, 168 >> 110-140 - 2h after lunch: n/c >> n/c - dinner (9-11 pm): 90-153, 173 >> 84-113, 2x 154 - 2h after dinner: 179, 218 >> 179-224, 234x1 - Bedtime (2 am): 112-177 >> 110-166  Pt's meals are: - Breakfast: 2-3 scrambled eggs, coffee + honey, oatmeal + honey - Lunch: salad + coffee/tea, chinese buffet, [employer] - Dinner: fish stew + rice, steak + broccoli or cabbage, okra soup - Snacks: mostly fruit No sodas, no sugary sweets. Occasionally pie. He restarted to go to go to the gym: Tai Chi - at least 2x a week.  - has mild CKD, last BUN/creatinine appeared a little improved:  Lab Results  Component Value Date   BUN 15 02/16/2014   CREATININE 1.13 02/16/2014  Not on an ACEI. - He has HL. Last set of lipids: Lab Results  Component Value Date   CHOL 207* 06/30/2013   HDL 36* 06/30/2013   LDLCALC 136* 06/30/2013   TRIG 175* 06/30/2013   CHOLHDL 5.8 06/30/2013  He is on Crestor 20. He was out of Crestor x 54 mo. Needs a refill. - last eye exam was in 03/2014. No DR.  - no numbness and tingling in his feet.  He also has a h/o HTN, gout, OSA, depression.  ROS: Constitutional: + weight gain, + increased appetite, no fatigue, no subjective hyperthermia/hypothermia Eyes: no blurry vision,  no xerophthalmia ENT: no sore throat, no nodules palpated in throat, no dysphagia/odynophagia, no hoarseness Cardiovascular: no CP/SOB/palpitations/leg swelling Respiratory: no cough/SOB Gastrointestinal: no N/V/D/C Musculoskeletal: no muscle/joint aches Skin: no rashes Neurological: no tremors/numbness/tingling/dizziness  I reviewed pt's medications, allergies, PMH, social hx, family hx and no changes required, except as mentioned above.  PE: BP 130/82 mmHg  Pulse 77  Temp(Src) 97.9 F (36.6 C) (Oral)  Wt 204 lb 3.2 oz (92.625 kg)  SpO2 94% Body mass index is 32.47 kg/(m^2).  Wt Readings from Last 3 Encounters:  06/29/14 204 lb 3.2 oz (92.625 kg)  05/21/14 201 lb (91.173 kg)  05/16/14 196 lb 3.2 oz (88.996 kg)   Constitutional: overweight, in NAD Eyes: PERRLA, EOMI, no exophthalmos ENT: moist mucous membranes, no thyromegaly, no cervical lymphadenopathy Cardiovascular: RRR, No MRG Respiratory: CTA B Gastrointestinal: abdomen soft, NT, ND, BS+ Musculoskeletal: no deformities, strength intact in all 4 Skin: moist, warm, no rashes Neurological: no tremor with outstretched hands, DTR normal in all 4  ASSESSMENT: 1. DM2, insulin-dependent, uncontrolled, with complications - ED  2. HL  PLAN:  1. Patient with ~1 year h/o uncontrolled diabetes, on oral antidiabetic regimen + basal insulin. Sugars much improved after the addition of insulin and improved further after addition of Januvia. The only higher sugars are after dinner. -  I suggested to:  Patient Instructions  Please continue Lantus 23 units at bedtime. Continue Metformin 1000 mg 2x a day with meals. Continue Januvia 100 mg in am. Please start Glipizide 5 mg before dinner if you have a large meal. Please stop at the lab Please return in 3 months with your sugar log.  - saw nutrition  - continue checking sugars at different times of the day - check 1-2 times a day, rotating checks - got the flu vaccine this  season - up to date with yearly eye exams  - will check HbA1c when he comes back, not 3 mo yet - Return to clinic in 3 mo with sugar log   2. HL - reviewed lipid levels along with the pt - also reviewed latest LFTs >> normal - he ran out of Crestor ~ 1 mo ago - will refill his Crestor 10 mg and check lipids at next visit - given discount card

## 2014-10-01 ENCOUNTER — Ambulatory Visit: Payer: 59 | Admitting: Internal Medicine

## 2014-11-12 ENCOUNTER — Other Ambulatory Visit: Payer: Self-pay | Admitting: Internal Medicine

## 2015-01-25 ENCOUNTER — Ambulatory Visit (INDEPENDENT_AMBULATORY_CARE_PROVIDER_SITE_OTHER): Payer: 59 | Admitting: Internal Medicine

## 2015-01-25 ENCOUNTER — Encounter: Payer: Self-pay | Admitting: Internal Medicine

## 2015-01-25 VITALS — BP 122/70 | HR 92 | Temp 98.0°F | Resp 12 | Wt 202.8 lb

## 2015-01-25 DIAGNOSIS — E1165 Type 2 diabetes mellitus with hyperglycemia: Secondary | ICD-10-CM | POA: Diagnosis not present

## 2015-01-25 DIAGNOSIS — Z794 Long term (current) use of insulin: Secondary | ICD-10-CM

## 2015-01-25 DIAGNOSIS — IMO0002 Reserved for concepts with insufficient information to code with codable children: Secondary | ICD-10-CM

## 2015-01-25 DIAGNOSIS — E785 Hyperlipidemia, unspecified: Secondary | ICD-10-CM | POA: Diagnosis not present

## 2015-01-25 LAB — HEMOGLOBIN A1C: Hgb A1c MFr Bld: 11.5 % — ABNORMAL HIGH (ref 4.6–6.5)

## 2015-01-25 LAB — LIPID PANEL
CHOLESTEROL: 113 mg/dL (ref 0–200)
HDL: 32.8 mg/dL — AB (ref >39.00)
LDL CALC: 44 mg/dL (ref 0–99)
NONHDL: 80.2
Total CHOL/HDL Ratio: 3
Triglycerides: 183 mg/dL — ABNORMAL HIGH (ref 0.0–149.0)
VLDL: 36.6 mg/dL (ref 0.0–40.0)

## 2015-01-25 MED ORDER — SITAGLIPTIN PHOSPHATE 100 MG PO TABS: 100.0000 mg | ORAL_TABLET | Freq: Every day | ORAL | Status: DC

## 2015-01-25 MED ORDER — INSULIN GLARGINE 100 UNIT/ML SOLOSTAR PEN: 23.0000 [IU] | PEN_INJECTOR | Freq: Every day | SUBCUTANEOUS | Status: DC

## 2015-01-25 NOTE — Patient Instructions (Addendum)
Please return in 2 months with your sugar log.   Please continue:  Lantus 23 units at bedtime.  Metformin 1000 mg 2x a day with meals.  Januvia 100 mg in am.  Glipizide 5 mg before dinner if you have a large meal.  Please stop at the lab

## 2015-01-25 NOTE — Progress Notes (Signed)
Patient ID: Danny Vazquez, male   DOB: 1960-11-22, 54 y.o.   MRN: [id number]  HPI: Danny Vazquez is a 54 y.o.-year-old male, returning for f/u for DM2, dx 06/2013, insulin-dependent, uncontrolled, with complications (ED). Last visit 7 months ago!  Last hemoglobin A1c was: Lab Results  Component Value Date   HGBA1C 10.9* 05/21/2014   HGBA1C >14.0 02/16/2014   HGBA1C 13.1 06/30/2013   Pt is on a regimen of: - Metformin 1000 mg po bid  - Lantus 20 >> 23 units in hs (started 02/2014) >> was out for 4 months!!! - Januvia 100 mg in am (started 04/2014) - Glipizide 5 mg before dinner  Pt started to check his sugars 1-2x a day - did not check sugars until last week >> 400 >> restarted insulin >> sugar 200. Reviewed sugars from last visit:  - am: 131-180 >> 91-149 - 2h after b'fast: 127, 242, 341 >> n/c - Lunch: 157, 168 >> 110-140 - 2h after lunch: n/c >> n/c - dinner (9-11 pm): 90-153, 173 >> 84-113, 2x 154 - 2h after dinner: 179, 218 >> 179-224, 234x1 - Bedtime (2 am): 112-177 >> 110-166  Pt's meals are: - Breakfast: 2-3 scrambled eggs, coffee + honey, oatmeal + honey - Lunch: salad + coffee/tea, chinese buffet, [employer] - Dinner: fish stew + rice, steak + broccoli or cabbage, okra soup - Snacks: mostly fruit No sodas, no sugary sweets. Occasionally pie. He restarted to go to go to the gym: Tai Chi - at least 2x a week.  - has mild CKD, last BUN/creatinine appeared a little improved:  Lab Results  Component Value Date   BUN 15 02/16/2014   CREATININE 1.13 02/16/2014  Not on an ACEI. - He has HL. Last set of lipids: Lab Results  Component Value Date   CHOL 207* 06/30/2013   HDL 36* 06/30/2013   LDLCALC 136* 06/30/2013   TRIG 175* 06/30/2013   CHOLHDL 5.8 06/30/2013  He is on Crestor 10.  - last eye exam was in 03/2014. No DR.  - no numbness and tingling in his feet.  ROS: Constitutional: + weight gain, fatigue, no subjective hyperthermia/hypothermia Eyes: no blurry vision,  no xerophthalmia ENT: no sore throat, no nodules palpated in throat, no dysphagia/odynophagia, no hoarseness Cardiovascular: no CP/SOB/palpitations/leg swelling Respiratory: no cough/SOB Gastrointestinal: no N/V/D/C Musculoskeletal: no muscle/+ joint aches Skin: no rashes Neurological: no tremors/numbness/tingling/dizziness  I reviewed pt's medications, allergies, PMH, social hx, family hx, and changes were documented in the history of present illness. Otherwise, unchanged from my initial visit note.  PE: BP 122/70 mmHg  Pulse 92  Temp(Src) 98 F (36.7 C) (Oral)  Resp 12  Wt 202 lb 12.8 oz (91.989 kg)  SpO2 94% Body mass index is 32.25 kg/(m^2).  Wt Readings from Last 3 Encounters:  01/25/15 202 lb 12.8 oz (91.989 kg)  06/29/14 204 lb 3.2 oz (92.625 kg)  05/21/14 201 lb (91.173 kg)   Constitutional: overweight, in NAD Eyes: PERRLA, EOMI, no exophthalmos ENT: moist mucous membranes, no thyromegaly, no cervical lymphadenopathy Cardiovascular: RRR, No MRG Respiratory: CTA B Gastrointestinal: abdomen soft, NT, ND, BS+ Musculoskeletal: no deformities, strength intact in all 4 Skin: moist, warm, no rashes Neurological: no tremor with outstretched hands, DTR normal in all 4  ASSESSMENT: 1. DM2, insulin-dependent, uncontrolled, with complications - ED  2. HL  PLAN:  1. Patient with ~1.5 year h/o uncontrolled diabetes, on oral antidiabetic regimen + basal insulin. He did not take this for several months and  did not check sugars!!! - when he finally checked :400's >> now decreased after getting back on insulin but no sugars available for review. .Will continue previous regimen: - I suggested to:  Patient Instructions  Please return in 2 months with your sugar log.   Please continue:  Lantus 23 units at bedtime.  Metformin 1000 mg 2x a day with meals.  Januvia 100 mg in am.  Glipizide 5 mg before dinner if you have a large meal.  Please stop at the lab  - continue  checking sugars at different times of the day - check 1-2 times a day, rotating checks - up to date with yearly eye exams  - will check HbA1c today - Return to clinic in 2 mo with sugar log   2. HL - reviewed lipid levels along with the pt - also reviewed latest LFTs >> normal - continue Crestor 10 mg and check lipids and CMP today  Office Visit on 01/25/2015  Component Date Value Ref Range Status  . Hgb A1c MFr Bld 01/25/2015 11.5* 4.6 - 6.5 % Final   Glycemic Control Guidelines for People with Diabetes:Non Diabetic:  <6%Goal of Therapy: <7%Additional Action Suggested:  >8%   . Cholesterol 01/25/2015 113  0 - 200 mg/dL Final   ATP III Classification       Desirable:  < 200 mg/dL               Borderline High:  200 - 239 mg/dL          High:  > = 240 mg/dL  . Triglycerides 01/25/2015 183.0* 0.0 - 149.0 mg/dL Final   Normal:  <150 mg/dLBorderline High:  150 - 199 mg/dL  . HDL 01/25/2015 32.80* >39.00 mg/dL Final  . VLDL 01/25/2015 36.6  0.0 - 40.0 mg/dL Final  . LDL Cholesterol 01/25/2015 44  0 - 99 mg/dL Final  . Total CHOL/HDL Ratio 01/25/2015 3   Final                  Men          Women1/2 Average Risk     3.4          3.3Average Risk          5.0          4.42X Average Risk          9.6          7.13X Average Risk          15.0          11.0                      . NonHDL 01/25/2015 80.20   Final   NOTE:  Non-HDL goal should be 30 mg/dL higher than patient's LDL goal (i.e. LDL goal of < 70 mg/dL, would have non-HDL goal of < 100 mg/dL)  . Sodium 01/25/2015 138  135 - 145 mEq/L Final  . Potassium 01/25/2015 3.9  3.5 - 5.3 mEq/L Final  . Chloride 01/25/2015 101  96 - 112 mEq/L Final  . CO2 01/25/2015 24  19 - 32 mEq/L Final  . Glucose, Bld 01/25/2015 239* 70 - 99 mg/dL Final  . BUN 01/25/2015 20  6 - 23 mg/dL Final  . Creat 01/25/2015 0.97  0.50 - 1.35 mg/dL Final  . Total Bilirubin 01/25/2015 0.7  0.2 - 1.2 mg/dL Final  . Alkaline Phosphatase 01/25/2015 65  39 - 117  U/L Final  .  AST 01/25/2015 23  0 - 37 U/L Final  . ALT 01/25/2015 31  0 - 53 U/L Final  . Total Protein 01/25/2015 7.1  6.0 - 8.3 g/dL Final  . Albumin 01/25/2015 4.1  3.5 - 5.2 g/dL Final  . Calcium 01/25/2015 9.0  8.4 - 10.5 mg/dL Final  . GFR, Est African American 01/25/2015 >89   Final  . GFR, Est Non African American 01/25/2015 88   Final   Comment:   The estimated GFR is a calculation valid for adults (>=50 years old) that uses the CKD-EPI algorithm to adjust for age and sex. It is   not to be used for children, pregnant women, hospitalized patients,    patients on dialysis, or with rapidly changing kidney function. According to the NKDEP, eGFR >89 is normal, 60-89 shows mild impairment, 30-59 shows moderate impairment, 15-29 shows severe impairment and <15 is ESRD.      Glu and HbA1c high, as expected!

## 2015-01-26 LAB — COMPLETE METABOLIC PANEL WITH GFR
ALK PHOS: 65 U/L (ref 39–117)
ALT: 31 U/L (ref 0–53)
AST: 23 U/L (ref 0–37)
Albumin: 4.1 g/dL (ref 3.5–5.2)
BUN: 20 mg/dL (ref 6–23)
CO2: 24 mEq/L (ref 19–32)
Calcium: 9 mg/dL (ref 8.4–10.5)
Chloride: 101 mEq/L (ref 96–112)
Creat: 0.97 mg/dL (ref 0.50–1.35)
GFR, EST NON AFRICAN AMERICAN: 88 mL/min
GFR, Est African American: >89 mL/min
Glucose, Bld: 239 mg/dL — ABNORMAL HIGH (ref 70–99)
Potassium: 3.9 mEq/L (ref 3.5–5.3)
Sodium: 138 mEq/L (ref 135–145)
Total Bilirubin: 0.7 mg/dL (ref 0.2–1.2)
Total Protein: 7.1 g/dL (ref 6.0–8.3)

## 2015-01-30 ENCOUNTER — Encounter: Payer: Self-pay | Admitting: *Deleted

## 2015-03-19 ENCOUNTER — Other Ambulatory Visit: Payer: Self-pay | Admitting: Emergency Medicine

## 2015-03-19 ENCOUNTER — Other Ambulatory Visit: Payer: Self-pay | Admitting: Internal Medicine

## 2015-03-22 ENCOUNTER — Other Ambulatory Visit: Payer: Self-pay | Admitting: Internal Medicine

## 2015-03-26 ENCOUNTER — Other Ambulatory Visit: Payer: Self-pay | Admitting: *Deleted

## 2015-03-26 DIAGNOSIS — E119 Type 2 diabetes mellitus without complications: Secondary | ICD-10-CM

## 2015-03-26 MED ORDER — METFORMIN HCL 1000 MG PO TABS: 1000.0000 mg | ORAL_TABLET | Freq: Two times a day (BID) | ORAL | Status: DC

## 2015-03-27 ENCOUNTER — Ambulatory Visit: Payer: 59 | Admitting: Internal Medicine

## 2015-04-21 ENCOUNTER — Encounter: Payer: Self-pay | Admitting: Internal Medicine

## 2015-05-07 ENCOUNTER — Other Ambulatory Visit: Payer: Self-pay | Admitting: Emergency Medicine

## 2015-05-08 ENCOUNTER — Ambulatory Visit (INDEPENDENT_AMBULATORY_CARE_PROVIDER_SITE_OTHER): Payer: 59

## 2015-05-08 ENCOUNTER — Ambulatory Visit (INDEPENDENT_AMBULATORY_CARE_PROVIDER_SITE_OTHER): Payer: 59 | Admitting: Emergency Medicine

## 2015-05-08 VITALS — BP 138/82 | HR 94 | Temp 97.9°F | Resp 18 | Ht 66.5 in | Wt 202.0 lb

## 2015-05-08 DIAGNOSIS — Z23 Encounter for immunization: Secondary | ICD-10-CM | POA: Diagnosis not present

## 2015-05-08 DIAGNOSIS — Z111 Encounter for screening for respiratory tuberculosis: Secondary | ICD-10-CM

## 2015-05-08 NOTE — Progress Notes (Addendum)
Subjective:  This chart was scribed for Arlyss Queen MD, by Tamsen Roers, at [facility].  This patient was seen in room 11 and the patient's care was started at 3:03 PM.    Patient ID: Danny Vazquez, male    DOB: 1961/05/31, 54 y.o.   MRN: [id number] Chief Complaint  Patient presents with  . Chest x-ray for TB    For work  . Immunizations    Wants flu shot    HPI  HPI Comments: Danny Vazquez is a 54 y.o. male who presents to the [facility] complaining of concern that his blood pressure is too high after getting a reading this morning.  Patient also needs a chest x ray and TB test for work.  He was compliant with his medication INH - which he took in 2002.  He is an Astronomer and teaches.    Left arm bp: 124/90 Right arm bp: 128/88   Patient Active Problem List   Diagnosis Date Noted  . Abnormal EKG 10/04/2012  . COLONIC POLYPS, ADENOMATOUS, BENIGN 04/21/2010  . VENTRICULAR HYPERTROPHY, LEFT 03/26/2010  . SLEEP APNEA 02/12/2010  . URINALYSIS, ABNORMAL 02/10/2010  . PHARYNGITIS 12/24/2009  . GOUT, UNSPECIFIED 12/12/2009  . Insulin dependent type 2 diabetes mellitus, uncontrolled 11/12/2009  . TINEA VERSICOLOR 10/28/2009  . LUMBAGO 10/28/2009  . SNORING 10/28/2009  . ELECTROCARDIOGRAM, ABNORMAL 10/28/2009  . PENILE LESION 03/18/2009  . WRIST PAIN, LEFT 03/18/2009  . KNEE PAIN, BILATERAL 03/18/2009  . DEPRESSION 06/13/2008  . ROTATOR CUFF SYNDROME, LEFT 06/13/2008  . OTHER ABNORMAL GLUCOSE 10/15/2007  . VIRAL INFECTION, ACUTE 09/27/2007  . PTERYGIUM, BILATERAL 05/08/2007  . ERECTILE DYSFUNCTION, SECONDARY TO MEDICATION 05/08/2007  . Hyperlipidemia 04/21/2007  . HYPERTENSION 04/21/2007  . ALLERGIC RHINITIS, SEASONAL 04/21/2007   Past Medical History  Diagnosis Date  . Allergy   . Depression   . Diabetes mellitus without complication     Diet controlled  . HTN (hypertension)    Past Surgical History  Procedure  Laterality Date  . None     Allergies  Allergen Reactions  . Food     pork  . Penicillins     REACTION: swelling and itching   Prior to Admission medications   Medication Sig Start Date End Date Taking? Authorizing Provider  cloNIDine (CATAPRES) 0.1 MG tablet TAKE 1 TABLET BY MOUTH TWICE DAILY 05/30/14  Yes Darlyne Russian, MD  CRESTOR 10 MG tablet TAKE 1 TABLET BY MOUTH ONCE DAILY 03/19/15  Yes Philemon Kingdom, MD  glipiZIDE (GLUCOTROL) 5 MG tablet TAKE 1 TABLET BY MOUTH BEFORE DINNER IF YOU HAVE A LARGE MEAL 03/22/15  Yes Philemon Kingdom, MD  glucose blood (ONETOUCH VERIO) test strip Use 2x a day 03/23/14  Yes Philemon Kingdom, MD  hydrochlorothiazide (HYDRODIURIL) 25 MG tablet TAKE 1 TABLET BY MOUTH ONCE DAILY.  "OV NEEDED" 03/20/15  Yes Chelle Jeffery, PA-C  indomethacin (INDOCIN) 25 MG capsule This medicine is to be used for acute flareups of gout. It is not to be taken on a regular basis 09/26/12  Yes Darlyne Russian, MD  Insulin Glargine (LANTUS SOLOSTAR) 100 UNIT/ML Solostar Pen Inject 23 Units into the skin at bedtime. 01/25/15  Yes Philemon Kingdom, MD  Insulin Pen Needle (CAREFINE PEN NEEDLES) 32G X 4 MM MISC Use once a day 03/23/14  Yes Philemon Kingdom, MD  losartan (COZAAR) 100 MG tablet TAKE 1 TABLET BY MOUTH ONCE DAILY. "OV NEEDED" 03/20/15  Yes  Chelle Jeffery, PA-C  metFORMIN (GLUCOPHAGE) 1000 MG tablet Take 1 tablet (1,000 mg total) by mouth 2 (two) times daily with a meal. 03/26/15  Yes Philemon Kingdom, MD  ONE TOUCH LANCETS MISC Use 2x a day 03/23/14  Yes Philemon Kingdom, MD  sitaGLIPtin (JANUVIA) 100 MG tablet Take 1 tablet (100 mg total) by mouth daily. 01/25/15  Yes Philemon Kingdom, MD  typhoid (VIVOTIF BERNA VACCINE) DR capsule Take 1 capsule by mouth every other day. 05/16/14  Yes Roselee Culver, MD  allopurinol (ZYLOPRIM) 300 MG tablet Take 1 tablet (300 mg total) by mouth daily. Patient not taking: Reported on 05/08/2015 02/16/14   Darlyne Russian, MD  atovaquone-proguanil  (MALARONE) 250-100 MG TABS Take 1 tablet by mouth daily. Patient not taking: Reported on 05/08/2015 05/16/14   Roselee Culver, MD   Social History   Social History  . Marital Status: Married    Spouse Name: N/A  . Number of Children: 4  . Years of Education: N/A   Occupational History  .  [employer]   Social History Main Topics  . Smoking status: Never Smoker   . Smokeless tobacco: Not on file  . Alcohol Use: Not on file  . Drug Use: Not on file  . Sexual Activity: Not on file   Other Topics Concern  . Not on file   Social History Narrative   Lives with wife.m          Review of Systems  Constitutional: Negative for fever and chills.  Respiratory: Negative for cough and shortness of breath.   Cardiovascular: Negative for chest pain.  Gastrointestinal: Negative for nausea and vomiting.  Musculoskeletal: Negative for neck pain and neck stiffness.       Objective:   Physical Exam  Filed Vitals:   05/08/15 1352  BP: 134/104  Pulse: 94  Temp: 97.9 F (36.6 C)  TempSrc: Oral  Resp: 18  Height: 5' 6.5" (1.689 m)  Weight: 202 lb (91.627 kg)  SpO2: 97%    CONSTITUTIONAL: Well developed/well nourished HEAD: Normocephalic/atraumatic EYES: EOMI/PERRL SPINE/BACK:entire spine nontender CV: S1/S2 noted, no murmurs/rubs/gallops noted LUNGS: Lungs are clear to auscultation bilaterally, no apparent distress NEURO: Pt is awake/alert/appropriate, moves all extremitiesx4.  No facial droop.   SKIN: warm, color normal PSYCH: no abnormalities of mood noted, alert and oriented to situation  [facility] reading (PRIMARY) by  Dr. Everlene Farrier no acute disease      Assessment & Plan:  Repeat blood pressure was good. He was given a flu shot today. 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 The recorded information has been  reviewed and is accurate.

## 2015-05-10 ENCOUNTER — Ambulatory Visit: Payer: 59 | Admitting: Internal Medicine

## 2015-05-17 ENCOUNTER — Other Ambulatory Visit: Payer: Self-pay | Admitting: *Deleted

## 2015-05-17 MED ORDER — CRESTOR 10 MG PO TABS: 10.0000 mg | ORAL_TABLET | Freq: Every day | ORAL | Status: DC

## 2015-05-28 ENCOUNTER — Encounter: Payer: Self-pay | Admitting: Emergency Medicine

## 2015-05-28 ENCOUNTER — Telehealth: Payer: Self-pay | Admitting: Internal Medicine

## 2015-05-28 NOTE — Telephone Encounter (Signed)
Danny Vazquez. They stated they did receive the rx refill and change to generic. It has been ready for 5 days. Called pt and advised him. Pt voiced understanding.

## 2015-05-28 NOTE — Telephone Encounter (Signed)
Patient states that his pharmacy is sending refill requests and they are not responding to them   Please advise   Thank you

## 2015-05-30 ENCOUNTER — Other Ambulatory Visit: Payer: Self-pay

## 2015-05-30 MED ORDER — HYDROCHLOROTHIAZIDE 25 MG PO TABS: ORAL_TABLET | ORAL | Status: DC

## 2015-06-02 ENCOUNTER — Ambulatory Visit (INDEPENDENT_AMBULATORY_CARE_PROVIDER_SITE_OTHER): Payer: 59 | Admitting: Emergency Medicine

## 2015-06-02 VITALS — BP 152/96 | HR 82 | Temp 98.0°F | Resp 16 | Ht 65.5 in | Wt 203.6 lb

## 2015-06-02 DIAGNOSIS — I10 Essential (primary) hypertension: Secondary | ICD-10-CM

## 2015-06-02 DIAGNOSIS — Z794 Long term (current) use of insulin: Secondary | ICD-10-CM

## 2015-06-02 DIAGNOSIS — E119 Type 2 diabetes mellitus without complications: Secondary | ICD-10-CM | POA: Diagnosis not present

## 2015-06-02 DIAGNOSIS — H269 Unspecified cataract: Secondary | ICD-10-CM

## 2015-06-02 DIAGNOSIS — Z0000 Encounter for general adult medical examination without abnormal findings: Secondary | ICD-10-CM

## 2015-06-02 DIAGNOSIS — Z1159 Encounter for screening for other viral diseases: Secondary | ICD-10-CM | POA: Diagnosis not present

## 2015-06-02 DIAGNOSIS — G4733 Obstructive sleep apnea (adult) (pediatric): Secondary | ICD-10-CM

## 2015-06-02 LAB — COMPLETE METABOLIC PANEL WITH GFR
ALT: 22 U/L (ref 9–46)
AST: 21 U/L (ref 10–35)
Albumin: 4.1 g/dL (ref 3.6–5.1)
Alkaline Phosphatase: 67 U/L (ref 40–115)
BUN: 14 mg/dL (ref 7–25)
CALCIUM: 9.2 mg/dL (ref 8.6–10.3)
CHLORIDE: 101 mmol/L (ref 98–110)
CO2: 26 mmol/L (ref 20–31)
Creat: 0.99 mg/dL (ref 0.70–1.33)
GFR, Est Non African American: 85 mL/min (ref >=60)
Glucose, Bld: 159 mg/dL — ABNORMAL HIGH (ref 65–99)
POTASSIUM: 4 mmol/L (ref 3.5–5.3)
Sodium: 138 mmol/L (ref 135–146)
Total Bilirubin: 0.8 mg/dL (ref 0.2–1.2)
Total Protein: 7.3 g/dL (ref 6.1–8.1)

## 2015-06-02 LAB — POCT CBC
GRANULOCYTE PERCENT: 48.8 % (ref 37–80)
HCT, POC: 49.7 % (ref 43.5–53.7)
Hemoglobin: 15.6 g/dL (ref 14.1–18.1)
Lymph, poc: 2.5 (ref 0.6–3.4)
MCH: 27.7 pg (ref 27–31.2)
MCHC: 31.4 g/dL — AB (ref 31.8–35.4)
MCV: 88.1 fL (ref 80–97)
MID (cbc): 0.3 (ref 0–0.9)
MPV: 7.3 fL (ref 0–99.8)
POC GRANULOCYTE: 2.7 (ref 2–6.9)
POC LYMPH PERCENT: 45.1 %L (ref 10–50)
POC MID %: 6.1 % (ref 0–12)
Platelet Count, POC: 221 10*3/uL (ref 142–424)
RBC: 5.64 M/uL (ref 4.69–6.13)
RDW, POC: 14.1 %
WBC: 5.6 10*3/uL (ref 4.6–10.2)

## 2015-06-02 LAB — MICROALBUMIN, URINE: Microalb, Ur: 3.8 mg/dL — ABNORMAL HIGH (ref <2.0)

## 2015-06-02 LAB — LIPID PANEL
CHOL/HDL RATIO: 2.9 ratio (ref <=5.0)
CHOLESTEROL: 105 mg/dL — AB (ref 125–200)
HDL: 36 mg/dL — ABNORMAL LOW (ref >=40)
LDL CALC: 52 mg/dL (ref <130)
TRIGLYCERIDES: 85 mg/dL (ref <150)
VLDL: 17 mg/dL (ref <30)

## 2015-06-02 LAB — GLUCOSE, POCT (MANUAL RESULT ENTRY): POC Glucose: 170 mg/dl — AB (ref 70–99)

## 2015-06-02 LAB — HEPATITIS C ANTIBODY: HCV Ab: NEGATIVE

## 2015-06-02 LAB — HEMOGLOBIN A1C
Hgb A1c MFr Bld: 11.7 % — AB (ref 4.0–6.0)
Hgb A1c MFr Bld: 11.7 % — AB (ref 4.0–6.0)

## 2015-06-02 LAB — POCT GLYCOSYLATED HEMOGLOBIN (HGB A1C): Hemoglobin A1C: 11.7

## 2015-06-02 MED ORDER — GLIPIZIDE 5 MG PO TABS: 5.0000 mg | ORAL_TABLET | Freq: Two times a day (BID) | ORAL | Status: DC

## 2015-06-02 MED ORDER — SITAGLIPTIN PHOSPHATE 100 MG PO TABS: 100.0000 mg | ORAL_TABLET | Freq: Every day | ORAL | Status: DC

## 2015-06-02 MED ORDER — LOSARTAN POTASSIUM 100 MG PO TABS: 100.0000 mg | ORAL_TABLET | Freq: Every day | ORAL | Status: DC

## 2015-06-02 MED ORDER — HYDROCHLOROTHIAZIDE 25 MG PO TABS: ORAL_TABLET | ORAL | Status: DC

## 2015-06-02 MED ORDER — INSULIN GLARGINE 100 UNIT/ML SOLOSTAR PEN: PEN_INJECTOR | SUBCUTANEOUS | Status: DC

## 2015-06-02 MED ORDER — ONETOUCH LANCETS MISC: Status: DC

## 2015-06-02 MED ORDER — INSULIN PEN NEEDLE 32G X 4 MM MISC: Status: DC

## 2015-06-02 MED ORDER — CRESTOR 10 MG PO TABS: 10.0000 mg | ORAL_TABLET | Freq: Every day | ORAL | Status: DC

## 2015-06-02 MED ORDER — GLUCOSE BLOOD VI STRP: ORAL_STRIP | Status: AC

## 2015-06-02 MED ORDER — AMLODIPINE BESYLATE 5 MG PO TABS
5.0000 mg | ORAL_TABLET | Freq: Every day | ORAL | Status: DC
Start: 2015-06-02 — End: 2016-05-27

## 2015-06-02 NOTE — Progress Notes (Signed)
This chart was scribed for Arlyss Queen, MD by Leandra Kern, Medical Scribe. This patient was seen in Room 12 and the patient's care was started at 10:50 AM.  Chief Complaint:  Chief Complaint  Patient presents with  . Annual Exam  . Medication Refill    HPI: Danny Vazquez is a 54 y.o. malewith a medical history of IDDM, HTN,  who reports to [facility] today complaining of for a complete physical exam. Pt notes that he does not regularly exercise. Pt reports that he last followed up with Dr. Cruzita Lederer in March, and he plans to meet with her on the 28th of this month. Pt states that he last got his eyes checked with an optometrist last year, however he has not followed up with him this year. He is aware of having cataracts in his eyes.  Pt is UTD with colonoscopy and tetanus shot. Pt states that he uses a CPAP machine for his diagnosed sleep apnea.   Pt's home country is [place].   Past Medical History  Diagnosis Date  . Allergy   . Depression   . Diabetes mellitus without complication ([place])     Diet controlled  . HTN (hypertension)    Past Surgical History  Procedure Laterality Date  . None     Social History   Social History  . Marital Status: Married    Spouse Name: N/A  . Number of Children: 4  . Years of Education: N/A   Occupational History  .  [employer]   Social History Main Topics  . Smoking status: Never Smoker   . Smokeless tobacco: None  . Alcohol Use: None  . Drug Use: None  . Sexual Activity: Not Asked   Other Topics Concern  . None   Social History Narrative   Lives with wife.m     Family History  Problem Relation Age of Onset  . Diabetes Mother   . Heart disease Father     CHF   Allergies  Allergen Reactions  . Food     pork  . Penicillins     REACTION: swelling and itching   Prior to Admission medications   Medication Sig Start Date End Date Taking? Authorizing Provider  atovaquone-proguanil (MALARONE) 250-100 MG TABS Take 1 tablet by mouth  daily. 05/16/14  Yes Roselee Culver, MD  CRESTOR 10 MG tablet Take 1 tablet (10 mg total) by mouth daily. 05/17/15  Yes Philemon Kingdom, MD  glipiZIDE (GLUCOTROL) 5 MG tablet TAKE 1 TABLET BY MOUTH BEFORE DINNER IF YOU HAVE A LARGE MEAL 03/22/15  Yes Philemon Kingdom, MD  glucose blood (ONETOUCH VERIO) test strip Use 2x a day 03/23/14  Yes Philemon Kingdom, MD  hydrochlorothiazide (HYDRODIURIL) 25 MG tablet TAKE 1 TABLET BY MOUTH ONCE DAILY. 05/30/15  Yes Darlyne Russian, MD  Insulin Glargine (LANTUS SOLOSTAR) 100 UNIT/ML Solostar Pen Inject 23 Units into the skin at bedtime. 01/25/15  Yes Philemon Kingdom, MD  Insulin Pen Needle (CAREFINE PEN NEEDLES) 32G X 4 MM MISC Use once a day 03/23/14  Yes Philemon Kingdom, MD  losartan (COZAAR) 100 MG tablet Take 1 tablet (100 mg total) by mouth daily. NO MORE REFILLS WITHOUT BLOOD PRESSURE CHECK UP - 2ND NOTICE 05/09/15  Yes Darlyne Russian, MD  metFORMIN (GLUCOPHAGE) 1000 MG tablet Take 1 tablet (1,000 mg total) by mouth 2 (two) times daily with a meal. 03/26/15  Yes Philemon Kingdom, MD  ONE TOUCH LANCETS MISC Use 2x a  day 03/23/14  Yes Philemon Kingdom, MD  sitaGLIPtin (JANUVIA) 100 MG tablet Take 1 tablet (100 mg total) by mouth daily. 01/25/15  Yes Philemon Kingdom, MD  typhoid (VIVOTIF BERNA VACCINE) DR capsule Take 1 capsule by mouth every other day. Patient not taking: Reported on 06/02/2015 05/16/14   Roselee Culver, MD     ROS: The patient denies fevers, chills, night sweats, unintentional weight loss, chest pain, palpitations, wheezing, dyspnea on exertion, nausea, vomiting, abdominal pain, dysuria, hematuria, melena, numbness, weakness, or tingling.   All other systems have been reviewed and were otherwise negative with the exception of those mentioned in the HPI and as above.    PHYSICAL EXAM: Filed Vitals:   06/02/15 1006  Pulse: 82  Temp: 98 F (36.7 C)  Resp: 16   Body mass index is 33.35 kg/(m^2).   General: Alert, no acute  distress HEENT:  Normocephalic, atraumatic, oropharynx patent. Eye: EOMI, [facility]. Bilateral cataracts.  Cardiovascular:  Regular rate and rhythm, no rubs murmurs or gallops.  No Carotid bruits, radial pulse intact. No pedal edema.  Respiratory: Clear to auscultation bilaterally.  No wheezes, rales, or rhonchi.  No cyanosis, no use of accessory musculature Abdominal: No organomegaly, abdomen is soft and non-tender, positive bowel sounds.  No masses. Musculoskeletal: Gait intact. No edema, tenderness Skin: No rashes. Neurologic: Facial musculature symmetric. Normal sensations.  Psychiatric: Patient acts appropriately throughout our interaction. Lymphatic: No cervical or submandibular lymphadenopathy Genitourinary: Prostate nl sized, no nodules.    LABS: Results for orders placed or performed in visit on 06/02/15  POCT CBC  Result Value Ref Range   WBC 5.6 4.6 - 10.2 K/uL   Lymph, poc 2.5 0.6 - 3.4   POC LYMPH PERCENT 45.1 10 - 50 %L   MID (cbc) 0.3 0 - 0.9   POC MID % 6.1 0 - 12 %M   POC Granulocyte 2.7 2 - 6.9   Granulocyte percent 48.8 37 - 80 %G   RBC 5.64 4.69 - 6.13 M/uL   Hemoglobin 15.6 14.1 - 18.1 g/dL   HCT, POC 49.7 43.5 - 53.7 %   MCV 88.1 80 - 97 fL   MCH, POC 27.7 27 - 31.2 pg   MCHC 31.4 (A) 31.8 - 35.4 g/dL   RDW, POC 14.1 %   Platelet Count, POC 221 142 - 424 K/uL   MPV 7.3 0 - 99.8 fL  POCT glucose (manual entry)  Result Value Ref Range   POC Glucose 170 (A) 70 - 99 mg/dl  POCT glycosylated hemoglobin (Hb A1C)  Result Value Ref Range   Hemoglobin A1C 11.7      EKG/XRAY:   Primary read interpreted by Dr. Everlene Farrier at [facility].   ASSESSMENT/PLAN: Diabetes not under control. He is due to see Dr. Karmen Stabs this week. He has already received his flu shot. Referral made to ophthalmology for evaluation of his cataracts.  By signing my name below, I, Rawaa Al Rifaie, attest that this documentation has been prepared under the direction and in the presence of Arlyss Queen,  MD.  Leandra Kern, Medical Scribe. 06/02/2015.  10:58 AM.     Johney [place] sideeffects, risk and benefits, and alternatives of medications d/w patient. Patient is aware that all medications have potential sideeffects and we are unable to predict every sideeffect or drug-drug interaction that may occur.  Arlyss Queen MD 06/02/2015 10:50 AM

## 2015-06-02 NOTE — Patient Instructions (Addendum)
Please be sure you see Dr. Cruzita Lederer. Increase her Lantus to 25 units at night. . I have added Norvasc 5 mg to take one a day to help with blood pressure control.

## 2015-06-19 ENCOUNTER — Encounter: Payer: Self-pay | Admitting: Family Medicine

## 2015-06-19 ENCOUNTER — Ambulatory Visit: Payer: 59 | Admitting: Internal Medicine

## 2015-06-20 ENCOUNTER — Ambulatory Visit (INDEPENDENT_AMBULATORY_CARE_PROVIDER_SITE_OTHER): Payer: 59 | Admitting: Internal Medicine

## 2015-06-20 ENCOUNTER — Encounter: Payer: Self-pay | Admitting: Internal Medicine

## 2015-06-20 VITALS — BP 118/86 | HR 83 | Temp 98.0°F | Resp 12 | Wt 205.0 lb

## 2015-06-20 DIAGNOSIS — E785 Hyperlipidemia, unspecified: Secondary | ICD-10-CM

## 2015-06-20 DIAGNOSIS — Z794 Long term (current) use of insulin: Secondary | ICD-10-CM

## 2015-06-20 DIAGNOSIS — E1165 Type 2 diabetes mellitus with hyperglycemia: Secondary | ICD-10-CM

## 2015-06-20 DIAGNOSIS — IMO0002 Reserved for concepts with insufficient information to code with codable children: Secondary | ICD-10-CM

## 2015-06-20 MED ORDER — INSULIN GLARGINE 100 UNIT/ML SOLOSTAR PEN: PEN_INJECTOR | SUBCUTANEOUS | Status: DC

## 2015-06-20 NOTE — Patient Instructions (Signed)
Please increase   Lantus to 30 units at bedtime.  Glipizide to 5 mg before b'fast and 5 mg before dinner. Continue:  Metformin 1000 mg 2x a day with meals.  Januvia 100 mg in am.  Please return in 1 months with your sugar log.   Try to schedule your next eye exam.

## 2015-06-20 NOTE — Progress Notes (Signed)
Patient ID: Danny Vazquez, male   DOB: 17-Oct-1960, 54 y.o.   MRN: [id number]  HPI: Danny Vazquez is a 54 y.o.-year-old male, returning for f/u for DM2, dx 06/2013, insulin-dependent, uncontrolled, with complications (ED). Last visit 4.5 months ago.  He is not compliant with his DM regimen (runs out of meds) and with office visits.  Last hemoglobin A1c was: Lab Results  Component Value Date   HGBA1C 11.7 06/02/2015   HGBA1C 11.7* 06/02/2015   HGBA1C 11.5* 01/25/2015   Pt is on a regimen of: - Metformin 1000 mg po bid  - Lantus 20 >> 23>> 25 units in hs (started 02/2014) - Januvia 100 mg in am (started 04/2014) - Glipizide 5 mg before a larger meal (not every day)  Pt again does not check sugars. Previously: - am: 131-180 >> 91-149  - 2h after b'fast: 127, 242, 341 >> n/c - Lunch: 157, 168 >> 110-140 - 2h after lunch: n/c >> n/c - dinner (9-11 pm): 90-153, 173 >> 84-113, 2x 154 - 2h after dinner: 179, 218 >> 179-224, 234x1 - Bedtime (2 am): 112-177 >> 110-166  Pt's meals are: - Breakfast: 2-3 scrambled eggs, biscuit + coffee  - Lunch: salad + coffee/tea, chinese buffet, [employer] - Dinner: fish stew + rice, steak + broccoli or cabbage, okra soup - Snacks: mostly fruit No sodas, no sugary sweets.  No exercise now.  - has mild CKD, last BUN/creatinine:  Lab Results  Component Value Date   BUN 14 06/02/2015   CREATININE 0.99 06/02/2015  Not on an ACEI. - He has HL. Last set of lipids: Lab Results  Component Value Date   CHOL 105* 06/02/2015   HDL 36* 06/02/2015   LDLCALC 52 06/02/2015   TRIG 85 06/02/2015   CHOLHDL 2.9 06/02/2015  He is on Crestor 10.  - last eye exam was in 03/2014. No DR.  - no numbness and tingling in his feet.  ROS: Constitutional: no weight gain/loss , fatigue, no subjective hyperthermia/hypothermia Eyes: no blurry vision, no xerophthalmia ENT: no sore throat, no nodules palpated in throat, no dysphagia/odynophagia, no hoarseness Cardiovascular:  no CP/SOB/palpitations/leg swelling Respiratory: no cough/SOB Gastrointestinal: no N/V/D/C Musculoskeletal: no muscle/+ joint aches Skin: no rashes Neurological: no tremors/numbness/tingling/dizziness  I reviewed pt's medications, allergies, PMH, social hx, family hx, and changes were documented in the history of present illness. Otherwise, unchanged from my initial visit note.  PE: BP 118/86 mmHg  Pulse 83  Temp(Src) 98 F (36.7 C) (Oral)  Resp 12  Wt 205 lb (92.987 kg)  SpO2 98% Body mass index is 33.58 kg/(m^2).  Wt Readings from Last 3 Encounters:  06/02/15 203 lb 9.6 oz (92.352 kg)  05/08/15 202 lb (91.627 kg)  01/25/15 202 lb 12.8 oz (91.989 kg)   Constitutional: overweight, in NAD Eyes: PERRLA, EOMI, no exophthalmos ENT: moist mucous membranes, no thyromegaly, no cervical lymphadenopathy Cardiovascular: RRR, No MRG Respiratory: CTA B Gastrointestinal: abdomen soft, NT, ND, BS+ Musculoskeletal: no deformities, strength intact in all 4 Skin: moist, warm, no rashes Neurological: no tremor with outstretched hands, DTR normal in all 4  ASSESSMENT: 1. DM2, insulin-dependent, uncontrolled, with complications - ED  2. HL  PLAN:  1. Patient with ~1.5 year h/o uncontrolled diabetes, on oral antidiabetic regimen + basal insulin. He was out of insulin for several months before last visit and did not check sugars!!! When he finally checked :400's. A HbA1c returned again very high. Sugars improved after he restarted the insulin, but last HbA1c  at the beginning of this month >> even higher than before. He is not checking sugars >> difficult to adjust the regimen, but will increase Lantus and start Glipizide 5 mg every day, before B'fast and dinner. - I suggested to:  Patient Instructions  Please increase   Lantus to 30 units at bedtime.  Glipizide to 5 mg before b'fast and 5 mg before dinner. Continue:  Metformin 1000 mg 2x a day with meals.  Januvia 100 mg in  am.  Please return in 1 months with your sugar log.   Try to schedule your next eye exam.  - start checking sugars at different times of the day - check 2-3 times a day, rotating checks - needs a new yearly eye exam >> advised to schedule - got his flu shot this season - Return to clinic in 1 mo with sugar log   2. HL - reviewed lipid levels along with the pt >> LDL and TG at goal, HDL low - also reviewed latest LFTs >> normal - continue Crestor 10 mg

## 2015-06-24 ENCOUNTER — Encounter: Payer: Self-pay | Admitting: Family Medicine

## 2015-07-10 LAB — HM DIABETES EYE EXAM

## 2015-07-12 ENCOUNTER — Ambulatory Visit: Payer: 59 | Admitting: Internal Medicine

## 2015-07-31 ENCOUNTER — Encounter: Payer: Self-pay | Admitting: Internal Medicine

## 2015-08-01 ENCOUNTER — Ambulatory Visit: Payer: 59 | Admitting: Internal Medicine

## 2015-08-29 MED FILL — JANUVIA 100 MG TABLET: 100 | 90 days supply | Qty: 90 | Fill #0

## 2015-08-29 MED FILL — glipiZIDE 5 MG TABS: 5 | 45 days supply | Qty: 90 | Fill #1

## 2015-09-24 ENCOUNTER — Ambulatory Visit: Payer: 59 | Admitting: Internal Medicine

## 2015-09-24 ENCOUNTER — Other Ambulatory Visit: Payer: Self-pay | Admitting: Internal Medicine

## 2015-09-24 MED FILL — LOSARTAN POTASSIUM 100 MG T: 100 | 90 days supply | Qty: 90 | Fill #1

## 2015-09-24 MED FILL — HYDROCHLOROTHIAZIDE 25 MG T: 25 | 90 days supply | Qty: 90 | Fill #0

## 2015-09-24 MED FILL — ROSUVASTATIN CALCIUM 10 MG: 10 | 90 days supply | Qty: 90 | Fill #0

## 2015-09-24 MED FILL — AMLODIPINE BESYLATE 5 MG TA: 5 | 90 days supply | Qty: 90 | Fill #1

## 2015-09-24 MED FILL — metFORMIN HCL 1000 MG TABS: 1000 | 90 days supply | Qty: 180 | Fill #1

## 2015-11-11 ENCOUNTER — Other Ambulatory Visit: Payer: Self-pay | Admitting: Emergency Medicine

## 2015-11-11 MED FILL — LANTUS SOLOSTAR 100 UNITS/M: 100 | 60 days supply | Qty: 15 | Fill #1

## 2015-11-27 MED FILL — glipiZIDE 5 MG TABS: 5 | 45 days supply | Qty: 90 | Fill #0

## 2016-01-13 ENCOUNTER — Other Ambulatory Visit: Payer: Self-pay | Admitting: Internal Medicine

## 2016-01-13 MED FILL — AMLODIPINE BESYLATE 5 MG TA: 5 | 90 days supply | Qty: 90 | Fill #2

## 2016-01-13 MED FILL — LOSARTAN POTASSIUM 100 MG T: 100 | 90 days supply | Qty: 90 | Fill #2

## 2016-01-13 MED FILL — HYDROCHLOROTHIAZIDE 25 MG T: 25 | 90 days supply | Qty: 90 | Fill #1

## 2016-01-13 MED FILL — metFORMIN HCL 1000 MG TABS: 1000 | 90 days supply | Qty: 180 | Fill #0

## 2016-01-13 MED FILL — JANUVIA 100 MG TABLET: 100 | 90 days supply | Qty: 90 | Fill #1

## 2016-01-13 MED FILL — UNIFINE PENTIPS 32GX5/32: 32G X 4 MM | 90 days supply | Qty: 100 | Fill #1

## 2016-03-13 ENCOUNTER — Other Ambulatory Visit: Payer: Self-pay | Admitting: Emergency Medicine

## 2016-03-13 MED FILL — ROSUVASTATIN CALCIUM 10 MG: 10 | 90 days supply | Qty: 90 | Fill #1

## 2016-03-23 ENCOUNTER — Encounter: Payer: Self-pay | Admitting: Internal Medicine

## 2016-03-23 ENCOUNTER — Other Ambulatory Visit: Payer: Self-pay | Admitting: Internal Medicine

## 2016-03-23 NOTE — Telephone Encounter (Signed)
No more refills 

## 2016-03-27 ENCOUNTER — Telehealth: Payer: Self-pay | Admitting: Internal Medicine

## 2016-03-27 NOTE — Telephone Encounter (Signed)
Patient dismissed from [facility] by Philemon Kingdom MD , effective March 23, 2016. Dismissal letter sent out by certified / registered mail.  DAJ

## 2016-03-31 NOTE — Telephone Encounter (Signed)
Received signed domestic return receipt verifying delivery of certified letter on March 28, 2016. Article number [id number] [place] DAJ

## 2016-04-22 IMAGING — CR DG CHEST 2V
2 series · 2 of 2 positions shown · non-contrast
Comparison: Chest x-ray of 05/16/2014

CLINICAL DATA: Positive PPD

EXAM:
CHEST  2 VIEW

[PA]
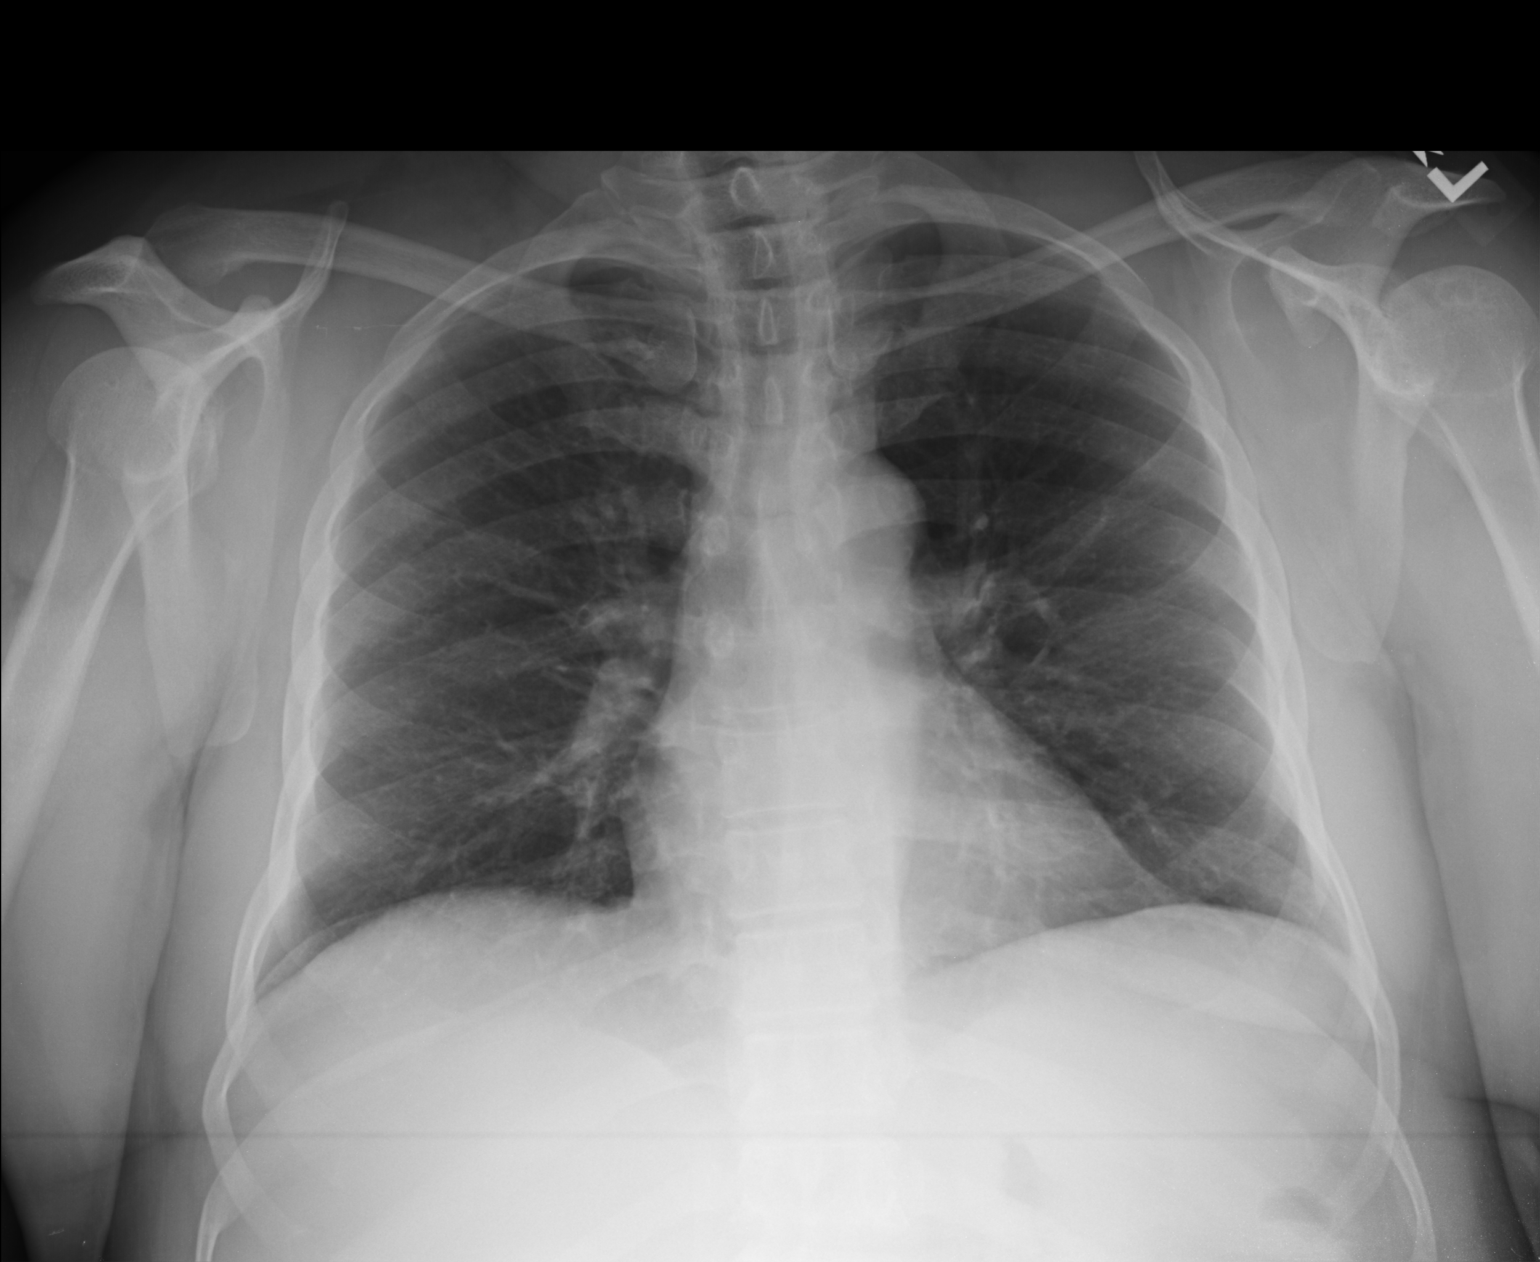

[lateral]
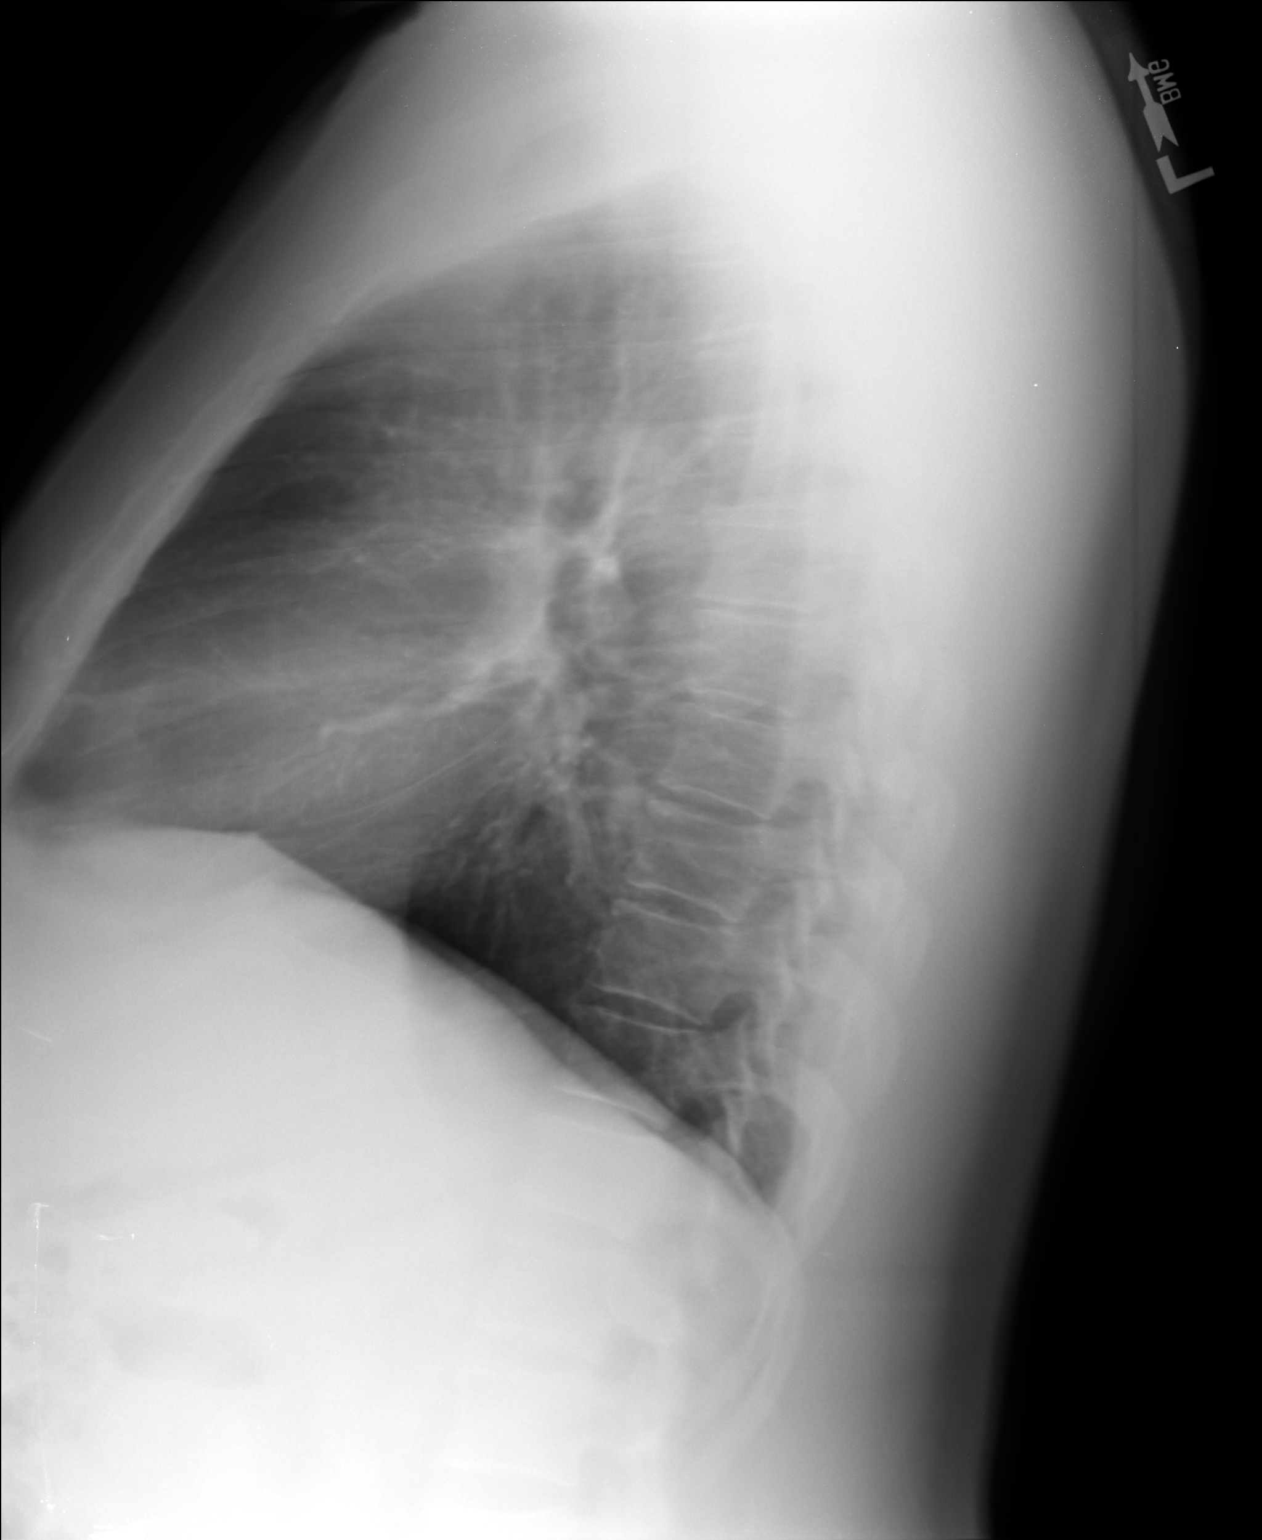

[2 of 2 positions shown; findings below may reference images not displayed]

FINDINGS: No active infiltrate or effusion is seen. Mediastinal and hilar
contours are unremarkable. No sequela of prior tuberculous infection
is seen. The heart is within normal limits in size. No bony
abnormality is noted.
IMPRESSION: No active cardiopulmonary disease.

## 2016-05-05 ENCOUNTER — Ambulatory Visit (INDEPENDENT_AMBULATORY_CARE_PROVIDER_SITE_OTHER): Payer: 59 | Admitting: Family Medicine

## 2016-05-05 VITALS — BP 160/102 | HR 92 | Temp 98.5°F | Resp 18 | Ht 66.0 in | Wt 185.2 lb

## 2016-05-05 DIAGNOSIS — Z23 Encounter for immunization: Secondary | ICD-10-CM

## 2016-05-05 DIAGNOSIS — Z111 Encounter for screening for respiratory tuberculosis: Secondary | ICD-10-CM | POA: Diagnosis not present

## 2016-05-05 DIAGNOSIS — I10 Essential (primary) hypertension: Secondary | ICD-10-CM | POA: Diagnosis not present

## 2016-05-05 NOTE — Patient Instructions (Signed)
     IF you received an x-ray today, you will receive an invoice from [facility]. Please contact [facility] at [phone] with questions or concerns regarding your invoice.   IF you received labwork today, you will receive an invoice from [facility]. Please contact [facility] at [phone] with questions or concerns regarding your invoice.   Our billing staff will not be able to assist you with questions regarding bills from these companies.  You will be contacted with the lab results as soon as they are available. The fastest way to get your results is to activate your My Chart account. Instructions are located on the last page of this paperwork. If you have not heard from us regarding the results in 2 weeks, please contact this office.      

## 2016-05-05 NOTE — Progress Notes (Signed)
Patient ID: Danny Vazquez, male    DOB: November 20, 1960, 55 y.o.   MRN: [id number]  PCP: Jenny Reichmann, MD  Chief Complaint  Patient presents with  . TB screening for work    had a positive test results 15 years ago    Subjective:   HPI  55 year old male presents for TB screening and influenza vaccination.  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  His employer requires that he receives an annual TB clearance in order to maintain compliance for his occupation. The patient received a chest x-ray last year, which was negative of any acute findings.  Patient reports he hasn't taken blood pressure medication today.  He reports readings are up and down, but mostly 130's and 140's.  Tuberculosis Risk Questionnaire  1. /NO-Were you born outside the [place] in one of the following parts of the world: [place], [place], [place], [place] or [place]?    2. No- Have you traveled outside the [place] and lived for more than one month in one of the following parts of the world: [place], [place], [place], [place] or [place]?    3. No- Do you have a compromised immune system such as from any of the following conditions:HIV/AIDS, organ or bone marrow transplantation, diabetes, immunosuppressive medicines (e.g. Prednisone, Remicaide), leukemia, lymphoma, cancer of the head or neck, gastrectomy or jejunal bypass, end-stage renal disease (on dialysis), or silicosis?     4. No-Have you ever or do you plan on working in: a residential care center, a health care facility, a jail or prison or homeless shelter?    5. No-Have you ever: injected illegal drugs, used crack cocaine, lived in a homeless shelter  or been in jail or prison?     6. No-Have you ever been exposed to anyone with infectious tuberculosis?    Tuberculosis Symptom Questionnaire  Do you currently have any of the following symptoms?  1. No-Unexplained cough lasting more than 3  weeks?   2. No-Unexplained fever lasting more than 3 weeks.   3. No-Night Sweats (sweating that leaves the bedclothes and sheets wet)     4. No-Shortness of Breath   5. No-Chest Pain   6. No-Unintentional weight loss    7. No-Unexplained fatigue (very tired for no reason)     Review of Systems SEE HPI    Patient Active Problem List   Diagnosis Date Noted  . Abnormal EKG 10/04/2012  . COLONIC POLYPS, ADENOMATOUS, BENIGN 04/21/2010  . VENTRICULAR HYPERTROPHY, LEFT 03/26/2010  . SLEEP APNEA 02/12/2010  . URINALYSIS, ABNORMAL 02/10/2010  . PHARYNGITIS 12/24/2009  . GOUT, UNSPECIFIED 12/12/2009  . Insulin dependent type 2 diabetes mellitus, uncontrolled ([place]) 11/12/2009  . TINEA VERSICOLOR 10/28/2009  . LUMBAGO 10/28/2009  . SNORING 10/28/2009  . ELECTROCARDIOGRAM, ABNORMAL 10/28/2009  . PENILE LESION 03/18/2009  . WRIST PAIN, LEFT 03/18/2009  . KNEE PAIN, BILATERAL 03/18/2009  . DEPRESSION 06/13/2008  . ROTATOR CUFF SYNDROME, LEFT 06/13/2008  . OTHER ABNORMAL GLUCOSE 10/15/2007  . VIRAL INFECTION, ACUTE 09/27/2007  . PTERYGIUM, BILATERAL 05/08/2007  . ERECTILE DYSFUNCTION, SECONDARY TO MEDICATION 05/08/2007  . Hyperlipidemia 04/21/2007  . HYPERTENSION 04/21/2007  . ALLERGIC RHINITIS, SEASONAL 04/21/2007     Prior to Admission medications   Medication Sig Start Date End Date Taking? Authorizing Provider  amLODipine (NORVASC) 5 MG tablet Take 1 tablet (5 mg total) by mouth daily. 06/02/15  Yes Darlyne Russian, MD  atovaquone-proguanil (MALARONE) 250-100 MG TABS Take 1 tablet by mouth daily. 05/16/14  Yes Roselee Culver, MD  CRESTOR 10 MG tablet Take 1 tablet (10 mg total) by mouth daily. 06/02/15  Yes Darlyne Russian, MD  glipiZIDE (GLUCOTROL) 5 MG tablet TAKE 1 TABLET BY MOUTH 2 TIMES DAILY BEFORE A MEAL. 11/12/15  Yes Darlyne Russian, MD  glipiZIDE (GLUCOTROL) 5 MG tablet TAKE 1 TABLET BY MOUTH BEFORE DINNER IF YOU HAVE A LARGE MEAL *NEED FOLLOW UP APPOINTMENT* 03/23/16   Yes Philemon Kingdom, MD  glucose blood (ONETOUCH VERIO) test strip Use 2x a day 06/02/15  Yes Darlyne Russian, MD  hydrochlorothiazide (HYDRODIURIL) 25 MG tablet TAKE 1 TABLET BY MOUTH ONCE DAILY. 06/02/15  Yes Darlyne Russian, MD  Insulin Glargine (LANTUS SOLOSTAR) 100 UNIT/ML Solostar Pen Inject under skin Lantus insulin 30 units at bedtime 06/20/15  Yes Philemon Kingdom, MD  Insulin Pen Needle (CAREFINE PEN NEEDLES) 32G X 4 MM MISC Use once a day 06/02/15  Yes Darlyne Russian, MD  losartan (COZAAR) 100 MG tablet Take 1 tablet (100 mg total) by mouth daily. 06/02/15  Yes Darlyne Russian, MD  metFORMIN (GLUCOPHAGE) 1000 MG tablet TAKE 1 TABLET BY MOUTH 2 TIMES DAILY WITH A MEAL. 01/13/16  Yes Philemon Kingdom, MD  ONE TOUCH LANCETS MISC Use 2x a day 06/02/15  Yes Darlyne Russian, MD  rosuvastatin (CRESTOR) 10 MG tablet TAKE 1 TABLET BY MOUTH ONCE DAILY 09/24/15  Yes Philemon Kingdom, MD  sitaGLIPtin (JANUVIA) 100 MG tablet Take 1 tablet (100 mg total) by mouth daily. 06/02/15  Yes Darlyne Russian, MD     Allergies  Allergen Reactions  . Food     pork  . Penicillins     REACTION: swelling and itching       Objective:  Physical Exam  Constitutional: He appears well-developed and well-nourished.  HENT:  Head: Normocephalic and atraumatic.  Right Ear: External ear normal.  Left Ear: External ear normal.  Nose: Nose normal.  Mouth/Throat: Oropharynx is clear and moist.  Cardiovascular: Normal rate, regular rhythm, normal heart sounds and intact distal pulses.   Pulmonary/Chest: Effort normal and breath sounds normal.     Vitals:   05/05/16 1214  BP: (!) 160/102  Pulse: 92  Resp: 18  Temp: 98.5 F (36.9 C)       Assessment & Plan:  1. Screening-pulmonary TB, verified with the [facility] that patient is no longer required to have another chest -x-ray and can be screened according to traditional CDC TB screening guidelines.  Plan:  Provided TB Free Letter to  patient.  2. Essential hypertension, elevated  Plan: Take continue Amlodipine 5 mg. If blood pressure remains elevated, return for care and will consider increasing dose of Amlodipine 10 mg.  3. Influenza vaccine administered - Flu Vaccine QUAD 36+ mos IM   Follow-up as needed.  Carroll Sage. Kenton Kingfisher, MSN, FNP-C [facility]

## 2016-05-06 ENCOUNTER — Telehealth: Payer: Self-pay | Admitting: Family Medicine

## 2016-05-06 NOTE — Telephone Encounter (Signed)
I spoke with pt he stated that we don't have to fax his paper work that he took care of the situatrion

## 2016-05-20 ENCOUNTER — Other Ambulatory Visit: Payer: Self-pay | Admitting: Emergency Medicine

## 2016-05-20 MED FILL — AMLODIPINE BESYLATE 5 MG TA: 5 | 90 days supply | Qty: 90 | Fill #3

## 2016-05-20 MED FILL — glipiZIDE 5 MG TABS: 5 | 30 days supply | Qty: 30 | Fill #0

## 2016-05-21 ENCOUNTER — Other Ambulatory Visit: Payer: Self-pay | Admitting: Emergency Medicine

## 2016-05-22 NOTE — Telephone Encounter (Signed)
Please advise on Januvia refill.

## 2016-05-22 NOTE — Telephone Encounter (Signed)
No additional refills until patient comes in for a diabetes follow-up.  He is due for annual physical 05/2016.

## 2016-05-23 NOTE — Telephone Encounter (Signed)
LMOVM Med will not be refilled until CPE due in October. Advised pt to call and get appt time for K. Kenton Kingfisher FNP

## 2016-05-25 MED FILL — JANUVIA 100 MG TABLET: 100 | 30 days supply | Qty: 30 | Fill #0

## 2016-05-26 MED FILL — HYDROCHLOROTHIAZIDE 25 MG T: 25 | 90 days supply | Qty: 90 | Fill #0

## 2016-05-26 MED FILL — LOSARTAN POTASSIUM 100 MG T: 100 | 90 days supply | Qty: 90 | Fill #0

## 2016-05-27 ENCOUNTER — Ambulatory Visit (INDEPENDENT_AMBULATORY_CARE_PROVIDER_SITE_OTHER): Payer: 59 | Admitting: Family Medicine

## 2016-05-27 VITALS — BP 132/90 | HR 90 | Temp 98.2°F | Resp 17 | Ht 66.0 in | Wt 184.0 lb

## 2016-05-27 DIAGNOSIS — Z130 Encounter for screening for diseases of the blood and blood-forming organs and certain disorders involving the immune mechanism: Secondary | ICD-10-CM

## 2016-05-27 DIAGNOSIS — E785 Hyperlipidemia, unspecified: Secondary | ICD-10-CM | POA: Diagnosis not present

## 2016-05-27 DIAGNOSIS — Z794 Long term (current) use of insulin: Secondary | ICD-10-CM

## 2016-05-27 DIAGNOSIS — Z125 Encounter for screening for malignant neoplasm of prostate: Secondary | ICD-10-CM | POA: Diagnosis not present

## 2016-05-27 DIAGNOSIS — Z0000 Encounter for general adult medical examination without abnormal findings: Secondary | ICD-10-CM

## 2016-05-27 DIAGNOSIS — I10 Essential (primary) hypertension: Secondary | ICD-10-CM

## 2016-05-27 DIAGNOSIS — Z1329 Encounter for screening for other suspected endocrine disorder: Secondary | ICD-10-CM

## 2016-05-27 DIAGNOSIS — Z111 Encounter for screening for respiratory tuberculosis: Secondary | ICD-10-CM | POA: Diagnosis not present

## 2016-05-27 DIAGNOSIS — E118 Type 2 diabetes mellitus with unspecified complications: Secondary | ICD-10-CM

## 2016-05-27 LAB — CBC WITH DIFFERENTIAL/PLATELET
BASOS ABS: 0 {cells}/uL (ref 0–200)
BASOS PCT: 0 %
EOS PCT: 4 %
Eosinophils Absolute: 220 cells/uL (ref 15–500)
HCT: 50.3 % — ABNORMAL HIGH (ref 38.5–50.0)
HEMOGLOBIN: 16.8 g/dL (ref 13.2–17.1)
LYMPHS ABS: 2915 {cells}/uL (ref 850–3900)
Lymphocytes Relative: 53 %
MCH: 30.1 pg (ref 27.0–33.0)
MCHC: 33.4 g/dL (ref 32.0–36.0)
MCV: 90.1 fL (ref 80.0–100.0)
MPV: 10.2 fL (ref 7.5–12.5)
Monocytes Absolute: 275 cells/uL (ref 200–950)
Monocytes Relative: 5 %
NEUTROS ABS: 2090 {cells}/uL (ref 1500–7800)
Neutrophils Relative %: 38 %
Platelets: 201 10*3/uL (ref 140–400)
RBC: 5.58 MIL/uL (ref 4.20–5.80)
RDW: 13.4 % (ref 11.0–15.0)
WBC: 5.5 10*3/uL (ref 3.8–10.8)

## 2016-05-27 LAB — POC MICROSCOPIC URINALYSIS (UMFC): MUCUS RE: ABSENT

## 2016-05-27 LAB — COMPLETE METABOLIC PANEL WITH GFR
ALT: 16 U/L (ref 9–46)
AST: 14 U/L (ref 10–35)
Albumin: 4.4 g/dL (ref 3.6–5.1)
Alkaline Phosphatase: 85 U/L (ref 40–115)
BUN: 21 mg/dL (ref 7–25)
CALCIUM: 10.2 mg/dL (ref 8.6–10.3)
CHLORIDE: 95 mmol/L — AB (ref 98–110)
CO2: 27 mmol/L (ref 20–31)
Creat: 1.32 mg/dL (ref 0.70–1.33)
GFR, Est African American: 69 mL/min (ref >=60)
GFR, Est Non African American: 60 mL/min (ref >=60)
Glucose, Bld: 367 mg/dL — ABNORMAL HIGH (ref 65–99)
POTASSIUM: 4.3 mmol/L (ref 3.5–5.3)
Sodium: 136 mmol/L (ref 135–146)
Total Bilirubin: 0.8 mg/dL (ref 0.2–1.2)
Total Protein: 7.7 g/dL (ref 6.1–8.1)

## 2016-05-27 LAB — LIPID PANEL
Cholesterol: 135 mg/dL (ref 125–200)
HDL: 35 mg/dL — AB (ref >=40)
LDL CALC: 47 mg/dL (ref <130)
Total CHOL/HDL Ratio: 3.9 Ratio (ref <=5.0)
Triglycerides: 265 mg/dL — ABNORMAL HIGH (ref <150)
VLDL: 53 mg/dL — ABNORMAL HIGH (ref <30)

## 2016-05-27 LAB — POCT URINALYSIS DIP (MANUAL ENTRY)
BILIRUBIN UA: NEGATIVE
BILIRUBIN UA: NEGATIVE
Glucose, UA: >=1000 — AB
LEUKOCYTES UA: NEGATIVE
Nitrite, UA: NEGATIVE
PH UA: 6.5
PROTEIN UA: NEGATIVE
Spec Grav, UA: 1.015
Urobilinogen, UA: 0.2

## 2016-05-27 LAB — POCT GLYCOSYLATED HEMOGLOBIN (HGB A1C): Hemoglobin A1C: 14

## 2016-05-27 LAB — TSH: TSH: 1.54 m[IU]/L (ref 0.40–4.50)

## 2016-05-27 MED ORDER — LOSARTAN POTASSIUM 100 MG PO TABS: 100.0000 mg | ORAL_TABLET | Freq: Every day | ORAL | 3 refills | Status: DC

## 2016-05-27 MED ORDER — SITAGLIPTIN PHOSPHATE 100 MG PO TABS: 100.0000 mg | ORAL_TABLET | Freq: Every day | ORAL | 3 refills | Status: DC

## 2016-05-27 MED ORDER — AMLODIPINE BESYLATE 5 MG PO TABS: 5.0000 mg | ORAL_TABLET | Freq: Every day | ORAL | 3 refills | Status: DC

## 2016-05-27 MED ORDER — DULAGLUTIDE 0.75 MG/0.5ML ~~LOC~~ SOAJ: 0.5000 mL | SUBCUTANEOUS | 3 refills | Status: DC

## 2016-05-27 MED ORDER — CRESTOR 10 MG PO TABS: 10.0000 mg | ORAL_TABLET | Freq: Every day | ORAL | 3 refills | Status: DC

## 2016-05-27 MED ORDER — METFORMIN HCL 1000 MG PO TABS: ORAL_TABLET | ORAL | 3 refills | Status: DC

## 2016-05-27 MED ORDER — DULAGLUTIDE 1.5 MG/0.5ML ~~LOC~~ SOAJ: 0.5000 mL | SUBCUTANEOUS | 3 refills | Status: DC

## 2016-05-27 MED ORDER — HYDROCHLOROTHIAZIDE 25 MG PO TABS: 25.0000 mg | ORAL_TABLET | Freq: Every day | ORAL | 3 refills | Status: DC

## 2016-05-27 MED FILL — metFORMIN HCL 1000 MG TABS: 1000 | 90 days supply | Qty: 180 | Fill #0

## 2016-05-27 NOTE — Patient Instructions (Addendum)
Start  Trulicity [id number] ml once weekly times 4 weeks. Then increase to Trulicity [id number] ml once weekly.  Check blood sugars at bedtime and fasting in the morning. Keep a log of readings.  Administer the following units of Lantus at Bedtime if blood sugars are as follows: Less than 150-do not administer Lantus 151-200-administer 10 units of Lantus 200-250-administer 20 units of Lantus Greater 250 administer 35 units of Lantus  If blood sugars are regularly greater than 300, return to the office for care sooner than 4 week follow-up.  Stop taking glipizide. Continue Metformin and Januvia as prescribed.  Take all other medications prescribed.  Will follow-up with remaining lab results.  Carroll Sage. Kenton Kingfisher, MSN, FNP-C [facility]  IF you received an x-ray today, you will receive an invoice from [facility]. Please contact [facility] at [phone] with questions or concerns regarding your invoice.   IF you received labwork today, you will receive an invoice from [facility]. Please contact [facility] at [phone] with questions or concerns regarding your invoice.   Our billing staff will not be able to assist you with questions regarding bills from these companies.  You will be contacted with the lab results as soon as they are available. The fastest way to get your results is to activate your My Chart account. Instructions are located on the last page of this paperwork. If you have not heard from [place] regarding the results in 2 weeks, please contact this office.

## 2016-05-27 NOTE — Progress Notes (Signed)
Patient ID: Danny Vazquez, male    DOB: 1961/02/10, 55 y.o.   MRN: [id number]  PCP: Molli Barrows, FNP  Chief Complaint  Patient presents with  . Annual Exam    Subjective:   HPI 55 year old presents for complete physical exam. Patient was a former patient of Dr. Everlene Farrier and has been followed here at [facility] for extended period of time. Chronic problems include uncontrolled type 2 diabetes and hypertension.  Diabetes Patient reports he was being followed by Dr. Prince Solian, at [facility]. He was dismissed from the clinic due to multiple appointment cancellations. He reports his blood glucose has been very high when he checks it.  He also reports he stopped his Lantus over 1 month ago because he was tired of injections and felt it wasn't working to lower his blood sugar and caused him weight gain.  He reports no routine exercise. Reports that he has significantly limited eating out and mostly prepares all of meals.  HEENT Reading glasses. No visual disturbances.  He reports that he will schedule an opthalmic exam soon. He obtains regular dental screenings and cleanings.  Cardiovascular/Lungs Denies chest pain, shortness of breath, dizziness, or headache. Blood pressure he reports that he occasionally monitors pressures and his readings are "up and down". He takes his medication regularly.  Abdominal Acid reflux symptoms last three days. He only experiences symptoms  when he eats very spicy foods as he did a few days ago. Otherwise he reports well controlled. Regular daily bowel habits. Denies bloody or tarry stools. Denies abdominal pain or tenderness.  Genitourinary  Reports increased frequency which he contributes to elevated blood sugars. Reports getting up at least twice during the night to urinate.  Musculoskeletal  Denies any problems or concerns  Neurological  Denies numbness, tingling, or weakness of hands or feet.  Psychological Health  Denies feeling  down or depressed. Denies anxiousness or sleep disturbances.   Social History   Social History  . Marital status: Married    Spouse name: N/A  . Number of children: 4  . Years of education: N/A   Occupational History  .  [employer]   Social History Main Topics  . Smoking status: Never Smoker  . Smokeless tobacco: Not on file  . Alcohol use Not on file  . Drug use: Unknown  . Sexual activity: Not on file   Other Topics Concern  . Not on file   Social History Narrative   Lives with wife.m     Review of Systems See HPI   Patient Active Problem List   Diagnosis Date Noted  . Abnormal EKG 10/04/2012  . COLONIC POLYPS, ADENOMATOUS, BENIGN 04/21/2010  . VENTRICULAR HYPERTROPHY, LEFT 03/26/2010  . SLEEP APNEA 02/12/2010  . URINALYSIS, ABNORMAL 02/10/2010  . PHARYNGITIS 12/24/2009  . GOUT, UNSPECIFIED 12/12/2009  . Insulin dependent type 2 diabetes mellitus, uncontrolled ([place]) 11/12/2009  . TINEA VERSICOLOR 10/28/2009  . LUMBAGO 10/28/2009  . SNORING 10/28/2009  . ELECTROCARDIOGRAM, ABNORMAL 10/28/2009  . PENILE LESION 03/18/2009  . WRIST PAIN, LEFT 03/18/2009  . KNEE PAIN, BILATERAL 03/18/2009  . DEPRESSION 06/13/2008  . ROTATOR CUFF SYNDROME, LEFT 06/13/2008  . OTHER ABNORMAL GLUCOSE 10/15/2007  . VIRAL INFECTION, ACUTE 09/27/2007  . PTERYGIUM, BILATERAL 05/08/2007  . ERECTILE DYSFUNCTION, SECONDARY TO MEDICATION 05/08/2007  . Hyperlipidemia 04/21/2007  . HYPERTENSION 04/21/2007  . ALLERGIC RHINITIS, SEASONAL 04/21/2007     Prior to Admission medications   Medication Sig Start Date End Date Taking? Authorizing Provider  amLODipine (NORVASC) 5 MG tablet Take 1 tablet (5 mg total) by mouth daily. 06/02/15  Yes Darlyne Russian, MD  atovaquone-proguanil (MALARONE) 250-100 MG TABS Take 1 tablet by mouth daily. 05/16/14  Yes Roselee Culver, MD  CRESTOR 10 MG tablet Take 1 tablet (10 mg total) by mouth daily. 06/02/15  Yes Darlyne Russian, MD  glipiZIDE (GLUCOTROL) 5 MG  tablet TAKE 1 TABLET BY MOUTH 2 TIMES DAILY BEFORE A MEAL. 11/12/15  Yes Darlyne Russian, MD  glipiZIDE (GLUCOTROL) 5 MG tablet TAKE 1 TABLET BY MOUTH BEFORE DINNER IF YOU HAVE A LARGE MEAL *NEED FOLLOW UP APPOINTMENT* 03/23/16  Yes Philemon Kingdom, MD  glucose blood (ONETOUCH VERIO) test strip Use 2x a day 06/02/15  Yes Darlyne Russian, MD  hydrochlorothiazide (HYDRODIURIL) 25 MG tablet TAKE 1 TABLET BY MOUTH ONCE DAILY. 05/20/16  Yes Chelle Jeffery, PA-C  Insulin Glargine (LANTUS SOLOSTAR) 100 UNIT/ML Solostar Pen Inject under skin Lantus insulin 30 units at bedtime 06/20/15  Yes Philemon Kingdom, MD  Insulin Pen Needle (CAREFINE PEN NEEDLES) 32G X 4 MM MISC Use once a day 06/02/15  Yes Darlyne Russian, MD  JANUVIA 100 MG tablet TAKE 1 TABLET BY MOUTH DAILY. 05/22/16  Yes Sedalia Muta, FNP  losartan (COZAAR) 100 MG tablet TAKE 1 TABLET BY MOUTH DAILY. 05/20/16  Yes Chelle Jeffery, PA-C  metFORMIN (GLUCOPHAGE) 1000 MG tablet TAKE 1 TABLET BY MOUTH 2 TIMES DAILY WITH A MEAL. 01/13/16  Yes Philemon Kingdom, MD  ONE TOUCH LANCETS MISC Use 2x a day 06/02/15  Yes Darlyne Russian, MD  rosuvastatin (CRESTOR) 10 MG tablet TAKE 1 TABLET BY MOUTH ONCE DAILY 09/24/15  Yes Philemon Kingdom, MD    Allergies  Allergen Reactions  . Food     pork  . Penicillins     REACTION: swelling and itching      Objective:  Physical Exam  Constitutional: He is oriented to person, place, and time. He appears well-developed and well-nourished.  HENT:  Head: Normocephalic and atraumatic.  Right Ear: External ear normal.  Left Ear: External ear normal.  Nose: Nose normal.  Mouth/Throat: Oropharynx is clear and moist.  Eyes: Conjunctivae and EOM are normal. Pupils are equal, round, and reactive to light.  Neck: Normal range of motion. Neck supple. No JVD present. No tracheal deviation present. No thyromegaly present.  Cardiovascular: Normal rate, regular rhythm, normal heart sounds and intact distal pulses.   No murmur  heard. Negative carotid bruits (R/L)  Pulmonary/Chest: Effort normal and breath sounds normal. No stridor.  Abdominal: Soft. Bowel sounds are normal. He exhibits no distension and no mass. There is no tenderness. There is no rebound and no guarding.  Musculoskeletal: Normal range of motion.  Lymphadenopathy:    He has no cervical adenopathy.  Neurological: He is oriented to person, place, and time. He has normal reflexes.  Skin: Skin is warm and dry.  Psychiatric: He has a normal mood and affect. His behavior is normal. Judgment and thought content normal.      Diabetic Foot Exam - Simple   Simple Foot Form Diabetic Foot exam was performed with the following findings:  Yes 05/27/2016  3:53 PM  Visual Inspection No deformities, no ulcerations, no other skin breakdown bilaterally:  Yes Sensation Testing Intact to touch and monofilament testing bilaterally:  Yes Pulse Check See comments:  Yes Comments Dorsalis pulse +2     Vitals:   05/27/16 1455  BP: 132/90  Pulse: 90  Resp: 17  Temp: 98.2 F (36.8 C)    Assessment & Plan:  1. Annual physical exam Age-appropriate anticipatory guidance provided.  2. Type 2 diabetes mellitus with complication, with long-term current use of insulin (HCC) - POCT Microscopic Urinalysis ([facility]) - POCT urinalysis dipstick - POCT glycosylated hemoglobin (Hb A1C) - COMPLETE METABOLIC PANEL WITH GFR - Microalbumin, urine  Diabetes have been consistently uncontrolled. Hemoglobin A1C 12 months ago 11.7 and today it measures at 14.0. His diabetes  is no longer managed by endocrinology.  Discussed with patient  that we will develop a plan to try and reduce A1C and control blood sugar. Although if unsuccessful, he will have to be referred to another endocrinologist.  Plan: Start Dulaglutide (Trulicity) [id number] ml weekly times 4 weeks and then start 1.5mg /0.5 ml.  Check blood sugars at bedtime and fasting in the morning. Keep a log of  readings.  Resume Glargine (Lantus) :  Administer the following units of Lantus at Bedtime if blood sugars are as follows: Less than 150-do not administer Lantus 151-200-administer 10 units of Lantus 200-250-administer 20 units of Lantus Greater 250 administer 35 units of Lantus If blood sugars are regularly greater than 300, return to the office for care sooner than 4 week follow-up.  Stop Glipizide. Resume Metformin 1000 mg twice daily. Medication refilled. Resume Januvia 100 mg once daily. Medication refilled.  Return for follow-up in 4 weeks.  Patient is in complete agreement with plan.  Provided saving coupons for Dulaglutide.  3. Hyperlipidemia, unspecified hyperlipidemia type - Lipid panel  Plan: Continue Crestor 10 mg daily. Will consider increasing if lipid panel results are significantly elevated.  4. Essential hypertension, stable and controlled today. Plan:  Continue Losartan 100 mg daily, HCTZ 25mg  daily, Amlodipine 5 mg daily.  5. Screening for malignant neoplasm of prostate - PSA  6. Screening for deficiency anemia  - CBC with Differential/Platelet  7. Screening for thyroid disorder  - TSH  8. Screening-pulmonary TB - Quantiferon tb gold assay (blood)  Carroll Sage. Kenton Kingfisher, MSN, FNP-C [facility]

## 2016-05-28 ENCOUNTER — Telehealth: Payer: Self-pay

## 2016-05-28 ENCOUNTER — Encounter: Payer: Self-pay | Admitting: Family Medicine

## 2016-05-28 LAB — PSA: PSA: 0.5 ng/mL (ref <=4.0)

## 2016-05-28 LAB — MICROALBUMIN, URINE: Microalb, Ur: 0.3 mg/dL

## 2016-05-28 MED FILL — TRULICITY 0.75 MG/0.5 ML PE: 0.75 | 28 days supply | Qty: 2 | Fill #0

## 2016-05-28 NOTE — Telephone Encounter (Signed)
Spoke with [place] at [facility]. Two prescription for  Were dulaglutide were submitted. Patient is starting on 0.75 mg dose and will titrate up after one month to 1.5 mg.  He is to follow-up in office in 4 weeks due to A1C 14.0 I went ahead and submitted prescription for higher dose. Patient has been dismissed from endocrinology office due to non-compliance. For this reason, I wanted patient to have access to medication.  Carroll Sage. Kenton Kingfisher, MSN, FNP-C [facility]

## 2016-05-28 NOTE — Progress Notes (Signed)
May 28, 2016   [place], [address] [place] [address]   Dear Mr. Lietz,  Below are the results from your recent visit were as expected.  Continue to adhere to medications as we discussed during your recent visit.  Return for follow-up in 4 weeks.  Resulted Orders  Lipid panel  Result Value Ref Range   Cholesterol 135 125 - 200 mg/dL   Triglycerides 265 (H) <150 mg/dL   HDL 35 (L) >=40 mg/dL   Total CHOL/HDL Ratio 3.9 <=5.0 Ratio   VLDL 53 (H) <30 mg/dL   LDL Cholesterol 47 <130 mg/dL     Comment:       Total Cholesterol/HDL Ratio:CHD Risk                        Coronary Heart Disease Risk Table                                        Men       Women          1/2 Average Risk              3.4        3.3              Average Risk              5.0        4.4           2X Average Risk              9.6        7.1           3X Average Risk             23.4       11.0 Use the calculated Patient Ratio above and the CHD Risk table  to determine the patient's CHD Risk.    Narrative   Performed at:  [facility]                [address], [address]                [place], [place] [address]  COMPLETE METABOLIC PANEL WITH GFR  Result Value Ref Range   Sodium 136 135 - 146 mmol/L   Potassium 4.3 3.5 - 5.3 mmol/L   Chloride 95 (L) 98 - 110 mmol/L   CO2 27 20 - 31 mmol/L   Glucose, Bld 367 (H) 65 - 99 mg/dL   BUN 21 7 - 25 mg/dL   Creat 1.32 0.70 - 1.33 mg/dL     Comment:       For patients > or = 56 years of age: The upper reference limit for Creatinine is approximately 13% higher for people identified as African-American.      Total Bilirubin 0.8 0.2 - 1.2 mg/dL   Alkaline Phosphatase 85 40 - 115 U/L   AST 14 10 - 35 U/L   ALT 16 9 - 46 U/L   Total Protein 7.7 6.1 - 8.1 g/dL   Albumin 4.4 3.6 - 5.1 g/dL   Calcium 10.2 8.6 - 10.3 mg/dL   GFR, Est African American 69 >=60 mL/min   GFR, Est Non African American 60 >=60 mL/min   Narrative   Performed at:  [facility]  [address]                [place], [place] [address]  TSH  Result Value Ref Range   TSH 1.54 0.40 - 4.50 mIU/L   Narrative   Performed at:  [facility]                [address], [address]                [place], [place] [address]  CBC with Differential/Platelet  Result Value Ref Range   WBC 5.5 3.8 - 10.8 K/uL   RBC 5.58 4.20 - 5.80 MIL/uL   Hemoglobin 16.8 13.2 - 17.1 g/dL   HCT 50.3 (H) 38.5 - 50.0 %   MCV 90.1 80.0 - 100.0 fL   MCH 30.1 27.0 - 33.0 pg   MCHC 33.4 32.0 - 36.0 g/dL   RDW 13.4 11.0 - 15.0 %   Platelets 201 140 - 400 K/uL   MPV 10.2 7.5 - 12.5 fL   Neutro Abs 2,090 1,500 - 7,800 cells/uL   Lymphs Abs 2,915 850 - 3,900 cells/uL   Monocytes Absolute 275 200 - 950 cells/uL   Eosinophils Absolute 220 15 - 500 cells/uL   Basophils Absolute 0 0 - 200 cells/uL   Neutrophils Relative % 38 %   Lymphocytes Relative 53 %   Monocytes Relative 5 %   Eosinophils Relative 4 %   Basophils Relative 0 %   Smear Review Criteria for review not met    Narrative   Performed at:  [facility]                [address]                [place], [place] [address]  PSA  Result Value Ref Range   PSA 0.5 <=4.0 ng/mL     Comment:       The total PSA value from this assay system is standardized against the WHO standard. The test result will be approximately 20% lower when compared to the equimolar-standardized total PSA (Beckman Coulter). Comparison of serial PSA results should be interpreted with this fact in mind.   This test was performed using the Siemens chemiluminescent method. Values obtained from different assay methods cannot be used interchangeably. PSA levels, regardless of value, should not be interpreted as absolute evidence of the presence or absence of disease.   Effective March 30, 2016, Total PSA is being tested on the Siemens Centaur XP using chemiluminescence  methodology. Re-baseline testing will be available until June 29, 2016 at no charge. If you have a patient that may require re-baselining, please order [id number] in addition to [id number].    Narrative   Performed at:  [facility]                [address]                [place], [place] [address]  Microalbumin, urine  Result Value Ref Range   Microalb, Ur 0.3 Not estab mg/dL     Comment:     The ADA has defined abnormalities in albumin excretion as follows:           Category           Result                            (  mcg/mg creatinine)                 Normal:    <30       Microalbuminuria:    30 - 299   Clinical albuminuria:    > or = 300   The ADA recommends that at least two of three specimens collected within a 3 - 6 month period be abnormal before considering a patient to be within a diagnostic category.      Narrative   Performed at:  [address], [address]                [place], [place] [address]     If you have any questions or concerns, please don't hesitate to call.  Sincerely,   Carroll Sage. Kenton Kingfisher, MSN, FNP-C [facility]

## 2016-05-28 NOTE — Telephone Encounter (Signed)
Danny Vazquez needs clarification on Dulaglutide strength. She said, usually patients are started on a low doe then tapered up.   Please f/u

## 2016-05-28 NOTE — Telephone Encounter (Signed)
Danny Vazquez from [facility] has concerns about Dulaglutide 1.5 MG/0.5ML SOPN [id number]. She says there are 2 strengths on file and usually you taper up. She is wondering if this is correct. Please advise Danny Vazquez at [phone]

## 2016-05-30 LAB — QUANTIFERON TB GOLD ASSAY (BLOOD)
Interferon Gamma Release Assay: NEGATIVE
QUANTIFERON NIL VALUE: 0.49 [IU]/mL
Quantiferon Tb Ag Minus Nil Value: <0 IU/mL

## 2016-07-09 ENCOUNTER — Other Ambulatory Visit: Payer: Self-pay | Admitting: Emergency Medicine

## 2016-07-09 MED FILL — ROSUVASTATIN CALCIUM 10 MG: 10 | 90 days supply | Qty: 90 | Fill #0

## 2016-07-09 MED FILL — JANUVIA 100 MG TABLET: 100 | 90 days supply | Qty: 90 | Fill #0

## 2016-07-10 MED FILL — LANTUS SOLOSTAR 100 UNITS/M: 100 | 60 days supply | Qty: 15 | Fill #0

## 2016-07-13 ENCOUNTER — Encounter: Payer: Self-pay | Admitting: Family Medicine

## 2016-07-13 DIAGNOSIS — H2513 Age-related nuclear cataract, bilateral: Secondary | ICD-10-CM | POA: Diagnosis not present

## 2016-07-13 DIAGNOSIS — H11013 Amyloid pterygium of eye, bilateral: Secondary | ICD-10-CM | POA: Diagnosis not present

## 2016-07-13 DIAGNOSIS — H35033 Hypertensive retinopathy, bilateral: Secondary | ICD-10-CM | POA: Diagnosis not present

## 2016-07-13 DIAGNOSIS — E119 Type 2 diabetes mellitus without complications: Secondary | ICD-10-CM | POA: Diagnosis not present

## 2016-07-13 LAB — HM DIABETES EYE EXAM

## 2016-07-21 MED FILL — TRULICITY 1.5 MG/0.5 ML PEN: 1.5 | 28 days supply | Qty: 2 | Fill #0

## 2016-10-28 MED FILL — JANUVIA 100 MG TABLET: 100 | 30 days supply | Qty: 30 | Fill #1

## 2016-10-28 MED FILL — ROSUVASTATIN CALCIUM 10 MG: 10 | 90 days supply | Qty: 90 | Fill #1

## 2016-10-28 MED FILL — AMLODIPINE BESYLATE 5 MG TA: 5 | 90 days supply | Qty: 90 | Fill #0

## 2016-10-28 MED FILL — LOSARTAN POTASSIUM 100 MG T: 100 | 90 days supply | Qty: 90 | Fill #0

## 2016-10-28 MED FILL — HYDROCHLOROTHIAZIDE 25 MG T: 25 | 90 days supply | Qty: 90 | Fill #0

## 2016-12-22 MED FILL — metFORMIN HCL 1000 MG TABS: 1000 | 90 days supply | Qty: 180 | Fill #1

## 2016-12-22 MED FILL — JANUVIA 100 MG TABLET: 100 | 30 days supply | Qty: 30 | Fill #2

## 2016-12-22 MED FILL — LANTUS SOLOSTAR 100 UNITS/M: 100 | 60 days supply | Qty: 15 | Fill #1

## 2017-05-04 ENCOUNTER — Encounter: Payer: Self-pay | Admitting: Family Medicine

## 2017-05-04 ENCOUNTER — Ambulatory Visit (INDEPENDENT_AMBULATORY_CARE_PROVIDER_SITE_OTHER): Payer: 59 | Admitting: Family Medicine

## 2017-05-04 ENCOUNTER — Ambulatory Visit (INDEPENDENT_AMBULATORY_CARE_PROVIDER_SITE_OTHER): Payer: 59

## 2017-05-04 VITALS — BP 130/92 | HR 79 | Temp 97.9°F | Resp 16 | Ht 67.0 in | Wt 172.0 lb

## 2017-05-04 DIAGNOSIS — I10 Essential (primary) hypertension: Secondary | ICD-10-CM

## 2017-05-04 DIAGNOSIS — R7611 Nonspecific reaction to tuberculin skin test without active tuberculosis: Secondary | ICD-10-CM

## 2017-05-04 DIAGNOSIS — E785 Hyperlipidemia, unspecified: Secondary | ICD-10-CM | POA: Diagnosis not present

## 2017-05-04 DIAGNOSIS — E1165 Type 2 diabetes mellitus with hyperglycemia: Secondary | ICD-10-CM | POA: Diagnosis not present

## 2017-05-04 DIAGNOSIS — Z9114 Patient's other noncompliance with medication regimen: Secondary | ICD-10-CM | POA: Diagnosis not present

## 2017-05-04 DIAGNOSIS — Z23 Encounter for immunization: Secondary | ICD-10-CM

## 2017-05-04 DIAGNOSIS — Z794 Long term (current) use of insulin: Secondary | ICD-10-CM

## 2017-05-04 DIAGNOSIS — Z9289 Personal history of other medical treatment: Secondary | ICD-10-CM

## 2017-05-04 NOTE — Patient Instructions (Signed)
Please return in one week so we can review lab results and discuss a plan for diabetes. See information below on diabetes and routine testing intervals.  If any nausea, vomiting, or other new symptoms, be seen here or the emergency room right away.  Please check your blood sugars at home and bring a record of those to next visit. Blood pressure was also borderline high here today, can check that outside of office and keep a record of those readings for the next visit as well.      IF you received an x-ray today, you will receive an invoice from [facility]. Please contact [facility] at [phone] with questions or concerns regarding your invoice.   IF you received labwork today, you will receive an invoice from [facility]. Please contact [facility] at [phone] with questions or concerns regarding your invoice.   Our billing staff will not be able to assist you with questions regarding bills from these companies.  You will be contacted with the lab results as soon as they are available. The fastest way to get your results is to activate your My Chart account. Instructions are located on the last page of this paperwork. If you have not heard from [place] regarding the results in 2 weeks, please contact this office.

## 2017-05-04 NOTE — Progress Notes (Signed)
Subjective:    Patient ID: Danny Vazquez, male    DOB: Mar 30, 1961, 56 y.o.   MRN: [id number]  HPI Danny Vazquez is a 56 y.o. male Presents today for: Chief Complaint  Patient presents with  . Annual Exam    tb skin test for work  . Medication Refill    dulaglutide, cozaar, glucophage, crestor, [place]   Here for multiple concerns today including a physical, TB skin test, and refill of multiple medications.  He is a new patient to me, has not been here since October 2017 when seen for a physical by Molli Barrows. History of uncontrolled diabetes with A1c of 14.0 at last visit, and had not been seen by endocrinology recently. It appears he had been dismissed from endocrinology due to noncompliance. He was started on treatment with trulicity 4.09 mg for 4 weeks then increase to 1.5 mg. Was restarted on Lantus. Glipizide was stopped, resumed metormin 1000 mg twice a day, and resume Januvia 100 mg daily. Was advised to return in 4 weeks. This is his first visit back since October 2017.  He states that he travels quite a bit, does not have another doctor. Interpretation in medical facilities, teaches at [employer].  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  previous vaccination with BCG, advised to not have PPD placed.  Just needs interview and CXR. Card indicated Rx with INH and Rifampin 06/2001 through 11/2001.  Tuberculosis Risk Questionnaire  1. Yes [place] Were you born outside the [place] in one of the following parts of the world: [place], [place], [place], [place] or [place]?    2. Yes [place] - 3 years ago.  Have you traveled outside the [place] and lived for more than one month in one of the following parts of the world: [place], [place], [place], [place] or [place]?    3. Yes diabetes.  Do you have a compromised immune system such as from any of the following conditions:HIV/AIDS, organ or bone marrow transplantation, diabetes,  immunosuppressive medicines (e.g. Prednisone, Remicaide), leukemia, lymphoma, cancer of the head or neck, gastrectomy or jejunal bypass, end-stage renal disease (on dialysis), or silicosis?     4. Yes - health care facility.  Have you ever or do you plan on working in: a residential care center, a health care facility, a jail or prison or homeless shelter?    5. No Have you ever: injected illegal drugs, used crack cocaine, lived in a homeless shelter  or been in jail or prison?     6. No Have you ever been exposed to anyone with infectious tuberculosis?  7. Yes BCG vaccine Have you ever had a BCG vaccine? (BCG is a vaccine for tuberculosis  (TB) used in OTHER countries, NOT in the [place]).  8. Yes  Have you ever been advised by a health care provider NOT to have a TB skin test?  9. Yes 06/15/01 Have you ever had a POSITIVE TB skin test?  IF SO, when? [facility]  IF SO, were you treated with INH? yes  IF SO, where? [facility]   Tuberculosis Symptom Questionnaire  Do you currently have any of the following symptoms?  1. No Unexplained cough lasting more than 3 weeks?   2. No Unexplained fever lasting more than 3 weeks.   3. No Night Sweats (sweating that leaves the bedclothes and sheets wet)     4. No  Shortness of Breath   5. No Chest Pain   6. No Unintentional weight loss  - has had some weight loss.   7. No Unexplained fatigue (very tired for no reason)   Diabetes:  as above, last eval in 2017.  Lantus - been off for 6 months. Did not like readings - not satisfactory - in 200's, so stopped Lantus. Trying to control diet. Not checked blood sugar in 3 months.  Taking Januvia.  Taking metformin  Started dulaglutide. Had diarrhea, so stopped after 2nd week. Did not call to inform us of that issue.  No N/V/abd pain.   HTN - taking Norvasc 5mg  qd, and losartan 100mg  qd. No home readings.   HLD - still taking crestor 10mg  qd - no new  side effects.    Patient Active Problem List   Diagnosis Date Noted  . Abnormal EKG 10/04/2012  . COLONIC POLYPS, ADENOMATOUS, BENIGN 04/21/2010  . VENTRICULAR HYPERTROPHY, LEFT 03/26/2010  . SLEEP APNEA 02/12/2010  . URINALYSIS, ABNORMAL 02/10/2010  . PHARYNGITIS 12/24/2009  . GOUT, UNSPECIFIED 12/12/2009  . Insulin dependent type 2 diabetes mellitus, uncontrolled ([place]) 11/12/2009  . TINEA VERSICOLOR 10/28/2009  . LUMBAGO 10/28/2009  . SNORING 10/28/2009  . ELECTROCARDIOGRAM, ABNORMAL 10/28/2009  . PENILE LESION 03/18/2009  . WRIST PAIN, LEFT 03/18/2009  . KNEE PAIN, BILATERAL 03/18/2009  . DEPRESSION 06/13/2008  . ROTATOR CUFF SYNDROME, LEFT 06/13/2008  . OTHER ABNORMAL GLUCOSE 10/15/2007  . VIRAL INFECTION, ACUTE 09/27/2007  . PTERYGIUM, BILATERAL 05/08/2007  . ERECTILE DYSFUNCTION, SECONDARY TO MEDICATION 05/08/2007  . Hyperlipidemia 04/21/2007  . HYPERTENSION 04/21/2007  . ALLERGIC RHINITIS, SEASONAL 04/21/2007   Past Medical History:  Diagnosis Date  . Allergy   . Depression   . Diabetes mellitus without complication ([place])    Diet controlled  . HTN (hypertension)    Past Surgical History:  Procedure Laterality Date  . None     Allergies  Allergen Reactions  . Food     pork  . Penicillins     REACTION: swelling and itching   Prior to Admission medications   Medication Sig Start Date End Date Taking? Authorizing Provider  amLODipine (NORVASC) 5 MG tablet Take 1 tablet (5 mg total) by mouth daily. 05/27/16  Yes Scot Jun, FNP  CRESTOR 10 MG tablet Take 1 tablet (10 mg total) by mouth daily. 05/27/16  Yes Scot Jun, FNP  Dulaglutide 1.5 MG/0.5ML SOPN Inject 0.5 mLs into the skin once a week. 05/27/16  Yes Scot Jun, FNP  glucose blood [facility] VERIO) test strip Use 2x a day 06/02/15  Yes Daub, Loura Back, MD  hydrochlorothiazide (HYDRODIURIL) 25 MG tablet Take 1 tablet (25 mg total) by mouth daily. 05/27/16  Yes Scot Jun, FNP    Insulin Glargine (LANTUS SOLOSTAR) 100 UNIT/ML Solostar Pen Inject under skin Lantus insulin 30 units at bedtime 06/20/15  Yes Philemon Kingdom, MD  Insulin Pen Needle (CAREFINE PEN NEEDLES) 32G X 4 MM MISC Use once a day 06/02/15  Yes Daub, Loura Back, MD  LANTUS SOLOSTAR 100 UNIT/ML Solostar Pen INCREASE LANTUS INSULIN TO 25 UNITS AT BEDTIME 07/10/16  Yes Scot Jun, FNP  losartan (COZAAR) 100 MG tablet Take 1 tablet (100 mg total) by mouth daily. 05/27/16  Yes Scot Jun, FNP  metFORMIN (GLUCOPHAGE) 1000 MG tablet TAKE 1 TABLET BY MOUTH 2 TIMES DAILY WITH A MEAL. 05/27/16  Yes Scot Jun, FNP  ONE TOUCH LANCETS MISC Use 2x a day 06/02/15  Yes Darlyne Russian, MD  sitaGLIPtin (JANUVIA) 100 MG tablet Take 1 tablet (100 mg total) by mouth daily. 05/27/16  Yes Scot Jun, FNP  atovaquone-proguanil (MALARONE) 250-100 MG TABS Take 1 tablet by mouth daily. Patient not taking: Reported on 05/04/2017 05/16/14   Roselee Culver, MD   Social History   Social History  . Marital status: Married    Spouse name: N/A  . Number of children: 4  . Years of education: N/A   Occupational History  .  [employer]   Social History Main Topics  . Smoking status: Never Smoker  . Smokeless tobacco: Never Used  . Alcohol use Not on file  . Drug use: Unknown  . Sexual activity: Not on file   Other Topics Concern  . Not on file   Social History Narrative   Lives with wife.m      Review of Systems  Constitutional: Negative for diaphoresis, fever and unexpected weight change.  Eyes: Negative for visual disturbance.  Respiratory: Negative for cough and shortness of breath.   Cardiovascular: Negative for chest pain.  Gastrointestinal: Negative for abdominal pain, nausea and vomiting.  Neurological: Negative for dizziness, light-headedness and headaches.       Objective:   Physical Exam  Vitals:   05/04/17 1518 05/04/17 1549  BP: (!) 146/101 (!) 130/92  Pulse: 79   Resp: 16    Temp: 97.9 F (36.6 C)   TempSrc: Oral   SpO2: 98%   Weight: 172 lb (78 kg)   Height: 5\' 7"  (1.702 m)      Dg Chest 1 View  Result Date: 05/04/2017 CLINICAL DATA:  Positive TB skin test EXAM: CHEST 1 VIEW COMPARISON:  05/08/2015 FINDINGS: Lungs are clear.  No pleural effusion or pneumothorax. The heart is normal in size. IMPRESSION: No evidence of acute cardiopulmonary disease. No findings to suggest active tuberculosis. Electronically Signed   By: Julian Hy M.D.   On: 05/04/2017 17:05       Assessment & Plan:  EILEEN CROSWELL is a 56 y.o. male Type 2 diabetes mellitus with hyperglycemia, with long-term current use of insulin ([place]) - Plan: Microalbumin, urine, Comprehensive metabolic panel, Hemoglobin A1c  - Previously uncontrolled December 2017, restart a medication, plan for follow-up in 4 weeks. He has not returned since that time. History of medication noncompliance including with his insulin. Denies nausea/vomiting, abdominal pain, blurry vision recently.  -Check CMP, A1c, restart previous regimen - still has medication at home as prescribed in October last year with one year of refills. Follow-up in one week to discuss plan. ER precautions if nausea/vomiting, abdominal pain, or vision, or other acute symptoms.  Need for prophylactic vaccination and inoculation against influenza - Plan: Flu Vaccine QUAD 36+ mos IM  Hyperlipidemia, unspecified hyperlipidemia type - Plan: Comprehensive metabolic panel, Lipid panel  -Tolerating Crestor. Check lipid panel, CMP.   Essential hypertension  -Borderline elevated. Monitor outside of office, continue same dose of HCTZ, losartan, amlodipine for now. Labs pending.  History of positive PPD - Plan: DG Chest 1 View  -Asymptomatic, chest x-ray without acute findings, letter provided indicating no sign of active tuberculosis in communicable form.  H/O medication noncompliance  -Attempted to identify barriers to his care and returning to  office. Initially identified travel, but advised on appointments and hours, as well as possible need to seek care where he is traveling to make sure he does receive adequate treatment for his diabetes. Potential risks of uncontrolled diabetes were discussed  including but not limited to vascular disease, including stroke or MI, chronic kidney disease, peripheral vascular disease.  -He agrees to follow-up in one week.  No orders of the defined types were placed in this encounter.  Patient Instructions   Please return in one week so we can review lab results and discuss a plan for diabetes. See information below on diabetes and routine testing intervals.  If any nausea, vomiting, or other new symptoms, be seen here or the emergency room right away.  Please check your blood sugars at home and bring a record of those to next visit. Blood pressure was also borderline high here today, can check that outside of office and keep a record of those readings for the next visit as well.      IF you received an x-ray today, you will receive an invoice from [facility] Radiology. Please contact [facility] Radiology at [phone] with questions or concerns regarding your invoice.   IF you received labwork today, you will receive an invoice from [place]. Please contact [facility] at [phone] with questions or concerns regarding your invoice.   Our billing staff will not be able to assist you with questions regarding bills from these companies.  You will be contacted with the lab results as soon as they are available. The fastest way to get your results is to activate your My Chart account. Instructions are located on the last page of this paperwork. If you have not heard from [place] regarding the results in 2 weeks, please contact this office.       I personally performed the services described in this documentation, which was scribed in my presence. The recorded information has been reviewed and  considered for accuracy and completeness, addended by me as needed, and agree with information above.  Signed,   Merri Ray, MD [facility].  05/04/17 5:35 PM

## 2017-05-05 LAB — COMPREHENSIVE METABOLIC PANEL
A/G RATIO: 1.3 (ref 1.2–2.2)
ALK PHOS: 68 IU/L (ref 39–117)
ALT: 9 IU/L (ref 0–44)
AST: 13 IU/L (ref 0–40)
Albumin: 4.1 g/dL (ref 3.5–5.5)
BUN/Creatinine Ratio: 12 (ref 9–20)
BUN: 12 mg/dL (ref 6–24)
Bilirubin Total: 0.8 mg/dL (ref 0.0–1.2)
CALCIUM: 9.5 mg/dL (ref 8.7–10.2)
CO2: 25 mmol/L (ref 20–29)
Chloride: 95 mmol/L — ABNORMAL LOW (ref 96–106)
Creatinine, Ser: 1.01 mg/dL (ref 0.76–1.27)
GFR calc Af Amer: 95 mL/min/{1.73_m2} (ref >59)
GFR, EST NON AFRICAN AMERICAN: 82 mL/min/{1.73_m2} (ref >59)
GLOBULIN, TOTAL: 3.2 g/dL (ref 1.5–4.5)
Glucose: 302 mg/dL — ABNORMAL HIGH (ref 65–99)
POTASSIUM: 4.3 mmol/L (ref 3.5–5.2)
SODIUM: 135 mmol/L (ref 134–144)
Total Protein: 7.3 g/dL (ref 6.0–8.5)

## 2017-05-05 LAB — LIPID PANEL
CHOLESTEROL TOTAL: 203 mg/dL — AB (ref 100–199)
Chol/HDL Ratio: 4.6 ratio (ref 0.0–5.0)
HDL: 44 mg/dL (ref >39)
LDL Calculated: 139 mg/dL — ABNORMAL HIGH (ref 0–99)
TRIGLYCERIDES: 98 mg/dL (ref 0–149)
VLDL Cholesterol Cal: 20 mg/dL (ref 5–40)

## 2017-05-05 LAB — HEMOGLOBIN A1C
Est. average glucose Bld gHb Est-mCnc: >398 mg/dL
Hgb A1c MFr Bld: >15.5 % — ABNORMAL HIGH (ref 4.8–5.6)

## 2017-05-05 LAB — MICROALBUMIN, URINE: MICROALBUM., U, RANDOM: 5.5 ug/mL

## 2017-05-11 ENCOUNTER — Encounter: Payer: Self-pay | Admitting: Family Medicine

## 2017-05-11 ENCOUNTER — Ambulatory Visit (INDEPENDENT_AMBULATORY_CARE_PROVIDER_SITE_OTHER): Payer: 59 | Admitting: Family Medicine

## 2017-05-11 VITALS — BP 153/93 | HR 85 | Temp 98.0°F | Resp 18 | Ht 67.5 in | Wt 176.0 lb

## 2017-05-11 DIAGNOSIS — Z794 Long term (current) use of insulin: Secondary | ICD-10-CM | POA: Diagnosis not present

## 2017-05-11 DIAGNOSIS — E785 Hyperlipidemia, unspecified: Secondary | ICD-10-CM | POA: Diagnosis not present

## 2017-05-11 DIAGNOSIS — I10 Essential (primary) hypertension: Secondary | ICD-10-CM | POA: Diagnosis not present

## 2017-05-11 DIAGNOSIS — E1165 Type 2 diabetes mellitus with hyperglycemia: Secondary | ICD-10-CM | POA: Diagnosis not present

## 2017-05-11 LAB — GLUCOSE, POCT (MANUAL RESULT ENTRY): POC Glucose: 268 mg/dl — AB (ref 70–99)

## 2017-05-11 MED ORDER — CRESTOR 10 MG PO TABS: 10.0000 mg | ORAL_TABLET | Freq: Every day | ORAL | 1 refills | Status: DC

## 2017-05-11 MED ORDER — LOSARTAN POTASSIUM 100 MG PO TABS: 100.0000 mg | ORAL_TABLET | Freq: Every day | ORAL | 1 refills | Status: DC

## 2017-05-11 MED ORDER — SITAGLIPTIN PHOSPHATE 100 MG PO TABS: 100.0000 mg | ORAL_TABLET | Freq: Every day | ORAL | 1 refills | Status: DC

## 2017-05-11 MED ORDER — AMLODIPINE BESYLATE 5 MG PO TABS: 5.0000 mg | ORAL_TABLET | Freq: Every day | ORAL | 1 refills | Status: DC

## 2017-05-11 MED ORDER — INSULIN GLARGINE 100 UNIT/ML SOLOSTAR PEN: PEN_INJECTOR | SUBCUTANEOUS | 2 refills | Status: DC

## 2017-05-11 MED ORDER — METFORMIN HCL 1000 MG PO TABS: ORAL_TABLET | ORAL | 1 refills | Status: DC

## 2017-05-11 MED ORDER — HYDROCHLOROTHIAZIDE 12.5 MG PO TABS: 12.5000 mg | ORAL_TABLET | Freq: Every day | ORAL | 2 refills | Status: DC

## 2017-05-11 MED FILL — LOSARTAN POTASSIUM 100 MG T: 100 | 90 days supply | Qty: 90 | Fill #1

## 2017-05-11 MED FILL — metFORMIN HCL 1000 MG TABS: 1000 | 90 days supply | Qty: 180 | Fill #0

## 2017-05-11 MED FILL — ROSUVASTATIN CALCIUM 10 MG: 10 | 90 days supply | Qty: 90 | Fill #2

## 2017-05-11 MED FILL — JANUVIA 100 MG TABLET: 100 | 30 days supply | Qty: 30 | Fill #3

## 2017-05-11 MED FILL — AMLODIPINE BESYLATE 5 MG TA: 5 | 90 days supply | Qty: 90 | Fill #1

## 2017-05-11 MED FILL — LANTUS SOLOSTAR 100 UNITS/M: 100 | 30 days supply | Qty: 12 | Fill #0

## 2017-05-11 NOTE — Patient Instructions (Addendum)
  Keep upcoming appointment for eye doctor.   Continue Lantus 25 units per day, then increase by 2 units every 2 days until readings remain under 200. Continue same dose of metformin and Januvia for now. I will refer you to another endocrinologist.  It is very important for your health to keep follow up with them.   Make sure you take your cholesterol medication every day. I lowered your HCTZ to 12.5 mg once per day, but the frequent urination is likely due to uncontrolled diabetes. We can measure your blood pressure next visit in 2-3 weeks to see if that medication is strong enough.    IF you received an x-ray today, you will receive an invoice from [facility]. Please contact [facility]iology at [phone] with questions or concerns regarding your invoice.   IF you received labwork today, you will receive an invoice from [place]. Please contact [facility] at [phone] with questions or concerns regarding your invoice.   Our billing staff will not be able to assist you with questions regarding bills from these companies.  You will be contacted with the lab results as soon as they are available. The fastest way to get your results is to activate your My Chart account. Instructions are located on the last page of this paperwork. If you have not heard from [place] regarding the results in 2 weeks, please contact this office.

## 2017-05-11 NOTE — Progress Notes (Signed)
Subjective:  This chart was scribed for Wendie Agreste, MD by Tamsen Roers, at [employer] at [facility].  This patient was seen in room 10 and the patient's care was started at 8:31 AM.   Chief Complaint  Patient presents with  . Annual Exam     Patient ID: Danny Vazquez, male    DOB: [birth year], 56 y.o.   MRN: [id number]  HPI HPI Comments: Danny Vazquez is a 56 y.o. male who presents to Primary Care at [facility] for a follow up and to establish care after a previous visit on October 2017.    Diabetes: He had an A1C of 14.0 October 2017.  He was dismissed from endocrinology due to non compliance.  Initially had been started on Trulicity. Restarted on lantis, Januvia and Metformin. As of last visit, he had stopped his insulin for at least 6 months as his blood sugars had remained elevated, so just stopped taking it. He had not been on Trulicity as had diarrhea after two weeks.  He was still taking Januvia and Metformin, his A1C last week was too high to read so greater than 15.5, blood sugar 302 in office.  He was initially kept on same medications until that reading.  Denies nausea/ vomiting or abdominal pain last visit.   He had a normal CO2, other electrolytes were overall normal except elevated blood sugar. ---- Patient has been taking his Metformin and Januvia.  He also restarted Lantis (for a whole week- (25 units at a time) since his last visit here. His  blood sugar at home as been ranging around 318-420.  Patient denies any symptomatic lows. He was not able to keep up with his appointments with endocrinology previously as he had a busy work schedule. He has had some blurry vision recently.  Last saw an eye doctor 1 year ago but has an upcoming appointment next month.  Patient keeps candy in his vicinity incase his sugar does drop to a low level.   He has been taking bee wax supplements.  Lab Results  Component Value Date   HGBA1C >15.5 (H) 05/04/2017   Lab Results  Component  Value Date   MICROALBUR 0.3 05/27/2016     Hyperlipidemia: He was taking Crestor 10 mg QD.---- Patient states that he may have missed some doses of his Crestor in the past.  Lab Results  Component Value Date   CHOL 203 (H) 05/04/2017   HDL 44 05/04/2017   LDLCALC 139 (H) 05/04/2017   TRIG 98 05/04/2017   CHOLHDL 4.6 05/04/2017   Lab Results  Component Value Date   ALT 9 05/04/2017   AST 13 05/04/2017   ALKPHOS 68 05/04/2017   BILITOT 0.8 05/04/2017    Hypertension: Blood pressure 130/92 at last visit.  Was continued on Losartan 100 mg QD, Norvasc 5 mg QD and HCTZ 25 mg QD. --- Patient has been compliant with these except for HCTZ as he has been having frequent urination and thought it was due to taking this medication.  Denies chest pains, lightheadedness or dizziness.         Patient Active Problem List   Diagnosis Date Noted  . Abnormal EKG 10/04/2012  . COLONIC POLYPS, ADENOMATOUS, BENIGN 04/21/2010  . VENTRICULAR HYPERTROPHY, LEFT 03/26/2010  . SLEEP APNEA 02/12/2010  . URINALYSIS, ABNORMAL 02/10/2010  . PHARYNGITIS 12/24/2009  . GOUT, UNSPECIFIED 12/12/2009  . Insulin dependent type 2 diabetes mellitus, uncontrolled ([place]) 11/12/2009  . TINEA VERSICOLOR 10/28/2009  .  LUMBAGO 10/28/2009  . SNORING 10/28/2009  . ELECTROCARDIOGRAM, ABNORMAL 10/28/2009  . PENILE LESION 03/18/2009  . WRIST PAIN, LEFT 03/18/2009  . KNEE PAIN, BILATERAL 03/18/2009  . DEPRESSION 06/13/2008  . ROTATOR CUFF SYNDROME, LEFT 06/13/2008  . OTHER ABNORMAL GLUCOSE 10/15/2007  . VIRAL INFECTION, ACUTE 09/27/2007  . PTERYGIUM, BILATERAL 05/08/2007  . ERECTILE DYSFUNCTION, SECONDARY TO MEDICATION 05/08/2007  . Hyperlipidemia 04/21/2007  . HYPERTENSION 04/21/2007  . ALLERGIC RHINITIS, SEASONAL 04/21/2007   Past Medical History:  Diagnosis Date  . Allergy   . Depression   . Diabetes mellitus without complication ([place])    Diet controlled  . HTN (hypertension)    Past Surgical History:    Procedure Laterality Date  . None     Allergies  Allergen Reactions  . Food     pork  . Penicillins     REACTION: swelling and itching   Prior to Admission medications   Medication Sig Start Date End Date Taking? Authorizing Provider  amLODipine (NORVASC) 5 MG tablet Take 1 tablet (5 mg total) by mouth daily. 05/27/16   Scot Jun, FNP  atovaquone-proguanil (MALARONE) 250-100 MG TABS Take 1 tablet by mouth daily. Patient not taking: Reported on 05/04/2017 05/16/14   Roselee Culver, MD  CRESTOR 10 MG tablet Take 1 tablet (10 mg total) by mouth daily. 05/27/16   Scot Jun, FNP  Dulaglutide 1.5 MG/0.5ML SOPN Inject 0.5 mLs into the skin once a week. 05/27/16   Scot Jun, FNP  glucose blood [facility] VERIO) test strip Use 2x a day 06/02/15   Darlyne Russian, MD  hydrochlorothiazide (HYDRODIURIL) 25 MG tablet Take 1 tablet (25 mg total) by mouth daily. 05/27/16   Scot Jun, FNP  Insulin Glargine (LANTUS SOLOSTAR) 100 UNIT/ML Solostar Pen Inject under skin Lantus insulin 30 units at bedtime 06/20/15   Philemon Kingdom, MD  Insulin Pen Needle (CAREFINE PEN NEEDLES) 32G X 4 MM MISC Use once a day 06/02/15   Darlyne Russian, MD  LANTUS SOLOSTAR 100 UNIT/ML Solostar Pen INCREASE LANTUS INSULIN TO 25 UNITS AT BEDTIME 07/10/16   Scot Jun, FNP  losartan (COZAAR) 100 MG tablet Take 1 tablet (100 mg total) by mouth daily. 05/27/16   Scot Jun, FNP  metFORMIN (GLUCOPHAGE) 1000 MG tablet TAKE 1 TABLET BY MOUTH 2 TIMES DAILY WITH A MEAL. 05/27/16   Scot Jun, FNP  ONE TOUCH LANCETS MISC Use 2x a day 06/02/15   Darlyne Russian, MD  sitaGLIPtin (JANUVIA) 100 MG tablet Take 1 tablet (100 mg total) by mouth daily. 05/27/16   Scot Jun, FNP   Social History   Social History  . Marital status: Married    Spouse name: N/A  . Number of children: 4  . Years of education: N/A   Occupational History  .  Un[employer]   Social History Main Topics  .  Smoking status: Never Smoker  . Smokeless tobacco: Never Used  . Alcohol use Not on file  . Drug use: Unknown  . Sexual activity: Not on file   Other Topics Concern  . Not on file   Social History Narrative   Lives with wife.m        Review of Systems  Constitutional: Negative for fatigue and unexpected weight change.  Eyes:       Blurry vision  Respiratory: Negative for cough, chest tightness and shortness of breath.   Cardiovascular: Negative for chest pain, palpitations and leg swelling.  Gastrointestinal: Negative for abdominal pain and blood in stool.  Genitourinary: Positive for frequency.  Neurological: Negative for dizziness, light-headedness and headaches.       Objective:   Physical Exam  Constitutional: He is oriented to person, place, and time. He appears well-developed and well-nourished.  HENT:  Head: Normocephalic and atraumatic.  Eyes: Pupils are equal, round, and reactive to light. EOM are normal.  Neck: No JVD present. Carotid bruit is not present.  Cardiovascular: Normal rate, regular rhythm and normal heart sounds.   No murmur heard. Pulmonary/Chest: Effort normal and breath sounds normal. He has no rales.  Musculoskeletal: He exhibits no edema.  Neurological: He is alert and oriented to person, place, and time.  Skin: Skin is warm and dry.  Psychiatric: He has a normal mood and affect.  Vitals reviewed.  Vitals:   05/11/17 0822  BP: (!) 153/93  Pulse: 85  Resp: 18  Temp: 98 F (36.7 C)  TempSrc: Oral  SpO2: 98%  Weight: 176 lb (79.8 kg)  Height: 5' 7.5" (1.715 m)         Assessment & Plan:   Danny Vazquez is a 56 y.o. male Type 2 diabetes mellitus with hyperglycemia, with long-term current use of insulin ([place]) - Plan: metFORMIN (GLUCOPHAGE) 1000 MG tablet, Insulin Glargine (LANTUS SOLOSTAR) 100 UNIT/ML Solostar Pen, sitaGLIPtin (JANUVIA) 100 MG tablet, POCT glucose (manual entry), Ambulatory referral to Endocrinology  - Uncontrolled  with previous medication nonadherence. Plan to increase Lantus by 2 units every 2-3 days until blood sugars below 200, then related that dose until follow-up. Continue metformin and Januvia same dose for now  -Refer to endocrinology. Stressed importance of follow-up with endocrine as well as ophthalmology as planned.  Hyperlipidemia, unspecified hyperlipidemia type - Plan: CRESTOR 10 MG tablet  -Tolerating Crestor, Importance of Crestor daily discussed. Can recheck levels in next few months.  Essential hypertension - Plan: amLODipine (NORVASC) 5 MG tablet, hydrochlorothiazide (HYDRODIURIL) 12.5 MG tablet, losartan (COZAAR) 100 MG tablet  -Mildly elevated in office as did not take his hydrochlorothiazide. Will lower that to HCTZ 12.5 mg but frequent urination likely due to diabetes. Monitor home readings with blood pressure and recheck in the next few weeks to make sure on sufficient meds.   Meds ordered this encounter  Medications  . metFORMIN (GLUCOPHAGE) 1000 MG tablet    Sig: TAKE 1 TABLET BY MOUTH 2 TIMES DAILY WITH A MEAL.    Dispense:  180 tablet    Refill:  1  . CRESTOR 10 MG tablet    Sig: Take 1 tablet (10 mg total) by mouth daily.    Dispense:  90 tablet    Refill:  1  . amLODipine (NORVASC) 5 MG tablet    Sig: Take 1 tablet (5 mg total) by mouth daily.    Dispense:  90 tablet    Refill:  1  . hydrochlorothiazide (HYDRODIURIL) 12.5 MG tablet    Sig: Take 1 tablet (12.5 mg total) by mouth daily.    Dispense:  30 tablet    Refill:  2  . Insulin Glargine (LANTUS SOLOSTAR) 100 UNIT/ML Solostar Pen    Sig: Inject under skin Lantus insulin 25 units at bedtime.  Increase by 2 units every 2 days until readings remain under 200.    Dispense:  4 pen    Refill:  2  . losartan (COZAAR) 100 MG tablet    Sig: Take 1 tablet (100 mg total) by mouth daily.    Dispense:  90 tablet    Refill:  1  . sitaGLIPtin (JANUVIA) 100 MG tablet    Sig: Take 1 tablet (100 mg total) by mouth daily.     Dispense:  90 tablet    Refill:  1   Patient Instructions    Keep upcoming appointment for eye doctor.   Continue Lantus 25 units per day, then increase by 2 units every 2 days until readings remain under 200. Continue same dose of metformin and Januvia for now. I will refer you to another endocrinologist.  It is very important for your health to keep follow up with them.   Make sure you take your cholesterol medication every day. I lowered your HCTZ to 12.5 mg once per day, but the frequent urination is likely due to uncontrolled diabetes. We can measure your blood pressure next visit in 2-3 weeks to see if that medication is strong enough.    IF you received an x-ray today, you will receive an invoice from [facility]. Please contact [facility] at [phone] with questions or concerns regarding your invoice.   IF you received labwork today, you will receive an invoice from [place]. Please contact [facility] at [phone] with questions or concerns regarding your invoice.   Our billing staff will not be able to assist you with questions regarding bills from these companies.  You will be contacted with the lab results as soon as they are available. The fastest way to get your results is to activate your My Chart account. Instructions are located on the last page of this paperwork. If you have not heard from [place] regarding the results in 2 weeks, please contact this office.       I personally performed the services described in this documentation, which was scribed in my presence. The recorded information has been reviewed and considered for accuracy and completeness, addended by me as needed, and agree with information above.  Signed,   Merri Ray, MD Primary Care at [place].  05/14/17 5:02 PM

## 2017-06-03 ENCOUNTER — Encounter: Payer: Self-pay | Admitting: Family Medicine

## 2017-06-03 ENCOUNTER — Ambulatory Visit (INDEPENDENT_AMBULATORY_CARE_PROVIDER_SITE_OTHER): Payer: 59 | Admitting: Family Medicine

## 2017-06-03 VITALS — BP 136/90 | HR 88 | Temp 98.3°F | Resp 17 | Ht 67.5 in | Wt 183.0 lb

## 2017-06-03 DIAGNOSIS — Z794 Long term (current) use of insulin: Secondary | ICD-10-CM

## 2017-06-03 DIAGNOSIS — E1165 Type 2 diabetes mellitus with hyperglycemia: Secondary | ICD-10-CM | POA: Diagnosis not present

## 2017-06-03 DIAGNOSIS — E785 Hyperlipidemia, unspecified: Secondary | ICD-10-CM

## 2017-06-03 DIAGNOSIS — I10 Essential (primary) hypertension: Secondary | ICD-10-CM

## 2017-06-03 NOTE — Patient Instructions (Addendum)
  I'm glad to see the diabetes improving. Continue same dose of insulin for now as well as metformin and Januvia. Returning to exercise will also help your blood sugars. If you do experience symptomatic low blood sugars, we may need to back off the dosing of insulin temporarily.  Exercise should also help blood pressure. Okay to stay at the same dose of medication for now. Goal blood pressure of less than 130/80, so if you continue to run higher than that reading, you can take 2 of the hydrochlorothiazide 12.5mg  pills per day (for total of 25 mg). If that dose does cause more problems with urination, return to the 12.5 mg dose.   Follow-up with me in mid December and we can recheck cholesterol and A1c at that time. Let me know if you have questions or concerns.   IF you received an x-ray today, you will receive an invoice from [facility]. Please contact [facility] at [phone] with questions or concerns regarding your invoice.   IF you received labwork today, you will receive an invoice from [place]. Please contact [facility] at [phone] with questions or concerns regarding your invoice.   Our billing staff will not be able to assist you with questions regarding bills from these companies.  You will be contacted with the lab results as soon as they are available. The fastest way to get your results is to activate your My Chart account. Instructions are located on the last page of this paperwork. If you have not heard from [place] regarding the results in 2 weeks, please contact this office.

## 2017-06-03 NOTE — Progress Notes (Signed)
Subjective:  By signing my name below, I, Moises Blood, attest that this documentation has been prepared under the direction and in the presence of Merri Ray, MD. Electronically Signed: Moises Blood, [place]. 06/03/2017 , 10:02 AM .  Patient was seen in Room 10 .   Patient ID: Danny Vazquez, male    DOB: May 10, 1961, 56 y.o.   MRN: [id number] Chief Complaint  Patient presents with  . Follow-up    diabetes and bloodwork results   HPI Danny Vazquez is a 56 y.o. male  Here for follow up.   Diabetes Patient's last office visit was on Sept 18th; initially seen on Sept 11th. His A1C was >15.5 due to medication non-compliance. His glucose was 302 in office. He was restarted on metformin and Januvia, as well as Lantus 25 units QD. He had been dismissed from endocrinology due to missed appointments and non-compliance. Planned to increase Lantus 2 units each day until his glucose is below 200. He was continued on metformin and Januvia, and a new referral was placed to endocrinology.   Patient reports being on Lantus 35 units now. His most recent home fasting sugars in the past few days on Lantus 35 units range from 155-208. His post meal home sugars over the past few days have been 180, 152, 238, and 271. His most recent readings in the mid 100s. He did feel jittery one evening, and ate 2 [employer] cups; he checked his sugar afterwards and it was 238. He denies any other symptomatic lows.   Lab Results  Component Value Date   MICROALBUR 0.3 05/27/2016    Hyperlipidemia Lab Results  Component Value Date   CHOL 203 (H) 05/04/2017   HDL 44 05/04/2017   LDLCALC 139 (H) 05/04/2017   TRIG 98 05/04/2017   CHOLHDL 4.6 05/04/2017   Lab Results  Component Value Date   ALT 9 05/04/2017   AST 13 05/04/2017   ALKPHOS 68 05/04/2017   BILITOT 0.8 05/04/2017   He previously took Crestor but with missed doses. He states he is taking Crestor, and no known side effects.    HTN He is on  Losartan 100mg  QD, Norvasc 5mg  QD and HCTZ 12.5mg . Did lower HCTZ dose due to frequent urination but likely due to hyperglycemia, not likely HCTZ.   Patient states his frequent urination improved, and he is still taking HCTZ. He plans to restart exercise soon.   Patient Active Problem List   Diagnosis Date Noted  . Abnormal EKG 10/04/2012  . COLONIC POLYPS, ADENOMATOUS, BENIGN 04/21/2010  . VENTRICULAR HYPERTROPHY, LEFT 03/26/2010  . SLEEP APNEA 02/12/2010  . URINALYSIS, ABNORMAL 02/10/2010  . PHARYNGITIS 12/24/2009  . GOUT, UNSPECIFIED 12/12/2009  . Insulin dependent type 2 diabetes mellitus, uncontrolled ([place]) 11/12/2009  . TINEA VERSICOLOR 10/28/2009  . LUMBAGO 10/28/2009  . SNORING 10/28/2009  . ELECTROCARDIOGRAM, ABNORMAL 10/28/2009  . PENILE LESION 03/18/2009  . WRIST PAIN, LEFT 03/18/2009  . KNEE PAIN, BILATERAL 03/18/2009  . DEPRESSION 06/13/2008  . ROTATOR CUFF SYNDROME, LEFT 06/13/2008  . OTHER ABNORMAL GLUCOSE 10/15/2007  . VIRAL INFECTION, ACUTE 09/27/2007  . PTERYGIUM, BILATERAL 05/08/2007  . ERECTILE DYSFUNCTION, SECONDARY TO MEDICATION 05/08/2007  . Hyperlipidemia 04/21/2007  . HYPERTENSION 04/21/2007  . ALLERGIC RHINITIS, SEASONAL 04/21/2007   Past Medical History:  Diagnosis Date  . Allergy   . Depression   . Diabetes mellitus without complication ([place])    Diet controlled  . HTN (hypertension)    Past Surgical History:  Procedure Laterality Date  .  None     Allergies  Allergen Reactions  . Food     pork  . Penicillins     REACTION: swelling and itching   Prior to Admission medications   Medication Sig Start Date End Date Taking? Authorizing Provider  amLODipine (NORVASC) 5 MG tablet Take 1 tablet (5 mg total) by mouth daily. 05/11/17   Wendie Agreste, MD  atovaquone-proguanil (MALARONE) 250-100 MG TABS Take 1 tablet by mouth daily. Patient not taking: Reported on 05/04/2017 05/16/14   Roselee Culver, MD  CRESTOR 10 MG tablet Take 1 tablet  (10 mg total) by mouth daily. 05/11/17   Wendie Agreste, MD  Dulaglutide 1.5 MG/0.5ML SOPN Inject 0.5 mLs into the skin once a week. 05/27/16   Scot Jun, FNP  glucose blood [facility] VERIO) test strip Use 2x a day 06/02/15   Darlyne Russian, MD  hydrochlorothiazide (HYDRODIURIL) 12.5 MG tablet Take 1 tablet (12.5 mg total) by mouth daily. 05/11/17   Wendie Agreste, MD  Insulin Glargine (LANTUS SOLOSTAR) 100 UNIT/ML Solostar Pen Inject under skin Lantus insulin 25 units at bedtime.  Increase by 2 units every 2 days until readings remain under 200. 05/11/17   Wendie Agreste, MD  Insulin Pen Needle (CAREFINE PEN NEEDLES) 32G X 4 MM MISC Use once a day 06/02/15   Darlyne Russian, MD  losartan (COZAAR) 100 MG tablet Take 1 tablet (100 mg total) by mouth daily. 05/11/17   Wendie Agreste, MD  metFORMIN (GLUCOPHAGE) 1000 MG tablet TAKE 1 TABLET BY MOUTH 2 TIMES DAILY WITH A MEAL. 05/11/17   Wendie Agreste, MD  ONE TOUCH LANCETS MISC Use 2x a day 06/02/15   Darlyne Russian, MD  sitaGLIPtin (JANUVIA) 100 MG tablet Take 1 tablet (100 mg total) by mouth daily. 05/11/17   Wendie Agreste, MD   Social History   Social History  . Marital status: Married    Spouse name: N/A  . Number of children: 4  . Years of education: N/A   Occupational History  .  Un[employer]   Social History Main Topics  . Smoking status: Never Smoker  . Smokeless tobacco: Never Used  . Alcohol use Not on file  . Drug use: Unknown  . Sexual activity: Not on file   Other Topics Concern  . Not on file   Social History Narrative   Lives with wife.m     Review of Systems  Constitutional: Negative for fatigue and unexpected weight change.  Eyes: Negative for visual disturbance.  Respiratory: Negative for cough, chest tightness and shortness of breath.   Cardiovascular: Negative for chest pain, palpitations and leg swelling.  Gastrointestinal: Negative for abdominal pain, blood in stool, diarrhea, nausea and vomiting.    Genitourinary: Negative for frequency.  Neurological: Negative for dizziness, light-headedness and headaches.       Objective:   Physical Exam  Constitutional: He is oriented to person, place, and time. He appears well-developed and well-nourished.  HENT:  Head: Normocephalic and atraumatic.  Eyes: Pupils are equal, round, and reactive to light. EOM are normal.  Neck: No JVD present. Carotid bruit is not present.  Cardiovascular: Normal rate, regular rhythm and normal heart sounds.   No murmur heard. Pulmonary/Chest: Effort normal and breath sounds normal. He has no rales.  Musculoskeletal: He exhibits no edema.  Neurological: He is alert and oriented to person, place, and time.  Skin: Skin is warm and dry.  Psychiatric: He has a  normal mood and affect.  Vitals reviewed.   Vitals:   06/03/17 0934  BP: 136/90  Pulse: 88  Resp: 17  Temp: 98.3 F (36.8 C)  TempSrc: Oral  SpO2: 95%  Weight: 183 lb (83 kg)  Height: 5' 7.5" (1.715 m)      Assessment & Plan:   Danny Vazquez is a 56 y.o. male Essential hypertension  -Near goal of 130/80 or below. He does plan on starting back into exercise, continue same regimen. Frequent urination has resolved, that was likely due to hyperglycemia.  - Monitor home readings and if remaining over 130/80, can restart 25 mg dose of HCTZ or combination of 2 of his 12.5 mg. Return to previous dose of frequent urination returns  Type 2 diabetes mellitus with hyperglycemia, with long-term current use of insulin ([place])  -Improving home readings, no measured hypoglycemia. One episode possibly where he was relatively low, but did not check reading. Advised to monitor blood sugars during any times of hypoglycemic symptoms and if that occurs may need to back off insulin dosing.   - continue same dose of Lantus at 35 units, continue metformin, Januvia with recheck in 2 months for repeat A1c. Restarting exercise should also help glycemic  control  Hyperlipidemia, unspecified hyperlipidemia type  - Tolerating Crestor, plan for lipid panel at next visit.  No orders of the defined types were placed in this encounter.  Patient Instructions    I'm glad to see the diabetes improving. Continue same dose of insulin for now as well as metformin and Januvia. Returning to exercise will also help your blood sugars. If you do experience symptomatic low blood sugars, we may need to back off the dosing of insulin temporarily.  Exercise should also help blood pressure. Okay to stay at the same dose of medication for now. Goal blood pressure of less than 130/80, so if you continue to run higher than that reading, you can take 2 of the hydrochlorothiazide 12.5mg  pills per day (for total of 25 mg). If that dose does cause more problems with urination, return to the 12.5 mg dose.   Follow-up with me in mid December and we can recheck cholesterol and A1c at that time. Let me know if you have questions or concerns.   IF you received an x-ray today, you will receive an invoice from [facility]. Please contact [facility] at [phone] with questions or concerns regarding your invoice.   IF you received labwork today, you will receive an invoice from [place]. Please contact [facility] at [phone] with questions or concerns regarding your invoice.   Our billing staff will not be able to assist you with questions regarding bills from these companies.  You will be contacted with the lab results as soon as they are available. The fastest way to get your results is to activate your My Chart account. Instructions are located on the last page of this paperwork. If you have not heard from [place] regarding the results in 2 weeks, please contact this office.      I personally performed the services described in this documentation, which was scribed in my presence. The recorded information has been reviewed and considered for accuracy and  completeness, addended by me as needed, and agree with information above.  Signed,   Merri Ray, MD Primary Care at [place].  06/03/17 10:07 AM

## 2017-08-03 ENCOUNTER — Ambulatory Visit: Payer: 59 | Admitting: Family Medicine

## 2017-12-07 ENCOUNTER — Other Ambulatory Visit: Payer: Self-pay | Admitting: Family Medicine

## 2017-12-07 MED FILL — ROSUVASTATIN CALCIUM 10 MG: 10 | 90 days supply | Qty: 90 | Fill #0

## 2017-12-07 MED FILL — metFORMIN HCL 1000 MG TABS: 1000 | 90 days supply | Qty: 180 | Fill #1

## 2017-12-07 MED FILL — LOSARTAN POTASSIUM 100 MG T: 100 | 90 days supply | Qty: 90 | Fill #0

## 2017-12-07 MED FILL — JANUVIA 100 MG TABLET: 100 | 60 days supply | Qty: 60 | Fill #0

## 2017-12-07 MED FILL — AMLODIPINE BESYLATE 5 MG TA: 5 | 90 days supply | Qty: 90 | Fill #0

## 2017-12-07 NOTE — Telephone Encounter (Signed)
crestor refilled, but overdue for follow up. Please schedule follow up appt. Thanks.

## 2018-04-19 IMAGING — DX DG CHEST 1V
1 series · 1 of 1 positions shown · non-contrast
Comparison: 05/08/2015

CLINICAL DATA: Positive TB skin test

EXAM:
CHEST 1 VIEW

[chest ap]
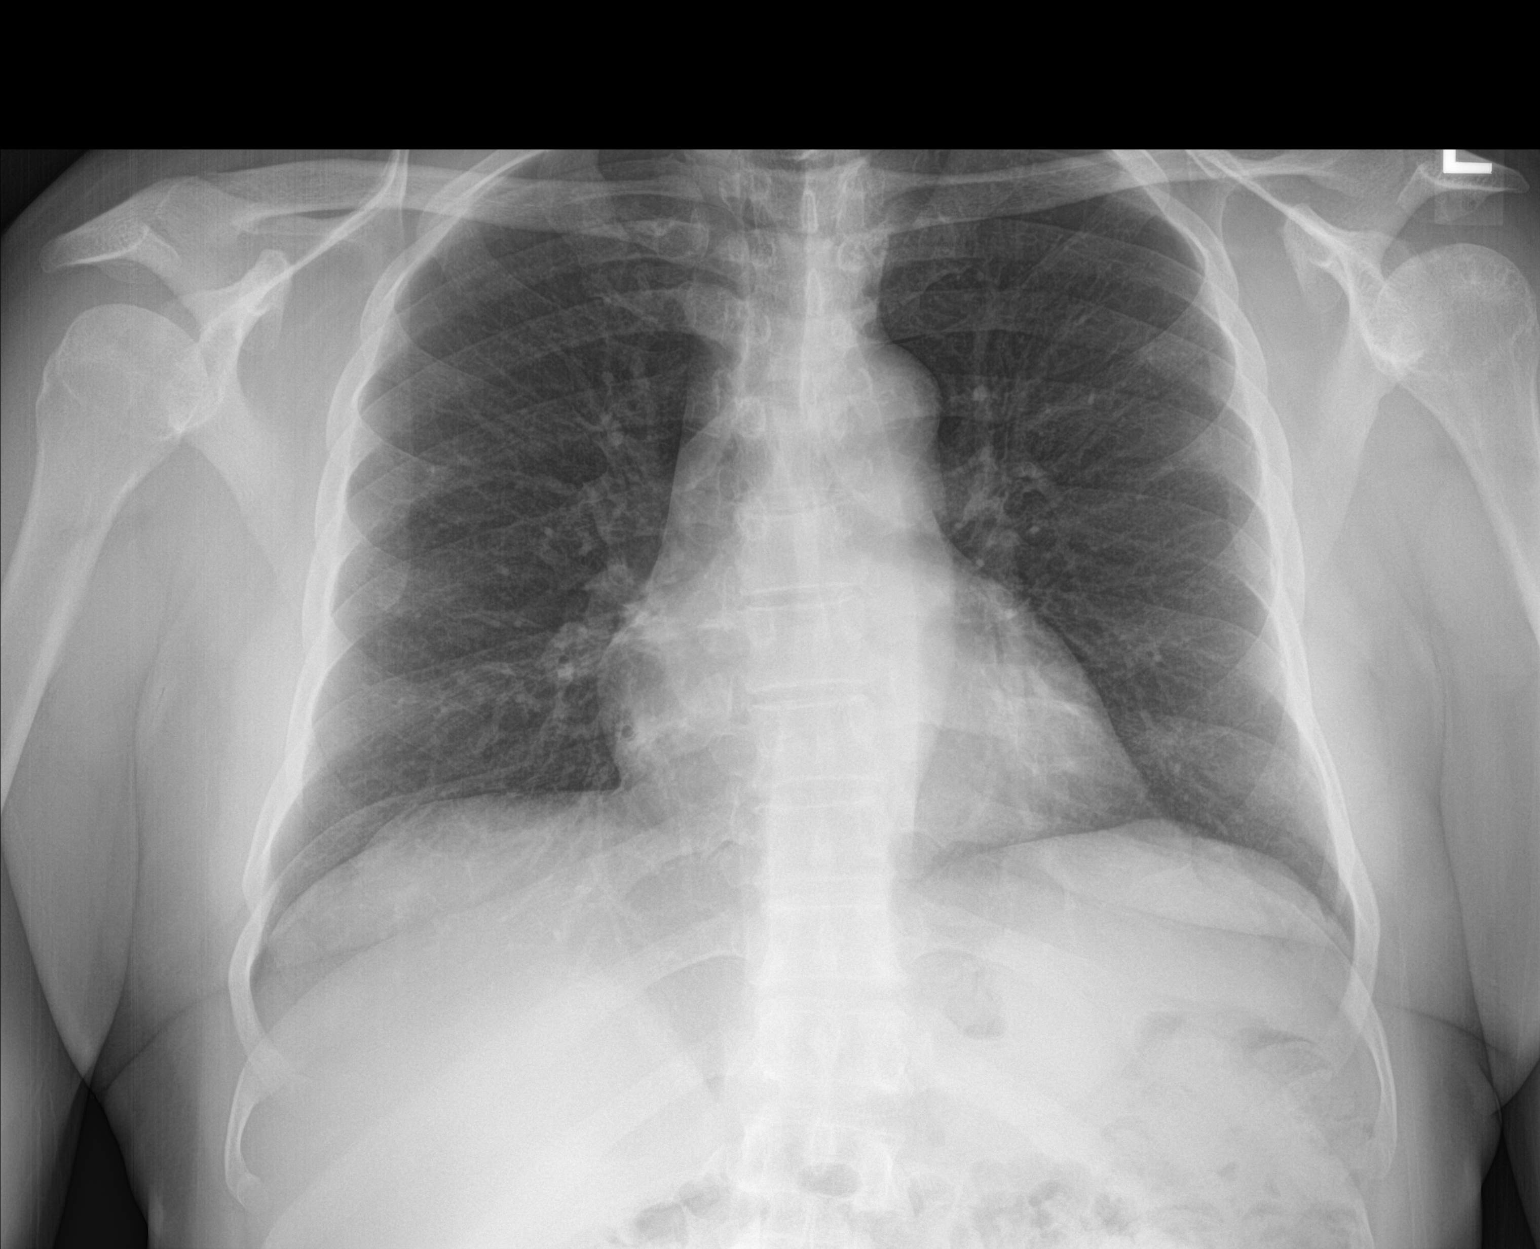

[1 of 1 positions shown; findings below may reference images not displayed]

FINDINGS: Lungs are clear.  No pleural effusion or pneumothorax.

The heart is normal in size.
IMPRESSION: No evidence of acute cardiopulmonary disease.

No findings to suggest active tuberculosis.

## 2018-05-23 ENCOUNTER — Ambulatory Visit: Payer: 59 | Admitting: Emergency Medicine

## 2018-05-27 ENCOUNTER — Ambulatory Visit: Payer: 59 | Admitting: Family Medicine

## 2018-06-10 ENCOUNTER — Other Ambulatory Visit: Payer: Self-pay | Admitting: Family Medicine

## 2018-06-10 DIAGNOSIS — I10 Essential (primary) hypertension: Secondary | ICD-10-CM

## 2018-06-10 DIAGNOSIS — E1165 Type 2 diabetes mellitus with hyperglycemia: Secondary | ICD-10-CM

## 2018-06-10 DIAGNOSIS — Z794 Long term (current) use of insulin: Principal | ICD-10-CM

## 2018-06-10 NOTE — Telephone Encounter (Signed)
Attempted to contact pt; last office visit 06/03/17; no office visits noted; left message on voicemail [phone]; when pt calls back please schedule appointment for office visit; will refill prescriptions for 30 days per protocol. Requested Prescriptions  Pending Prescriptions Disp Refills  . Insulin Glargine (LANTUS SOLOSTAR) 100 UNIT/ML Solostar Pen [Pharmacy Med Name: LANTUS SOLOSTAR 100 UNITS/M 100 SOPN] 12 mL 2    Sig: INJECT 25 UNITS UNDER THE SKIN AT BEDTIME. INCREASE BY 2 UNITS EVERY 2 DAYS UNTIL READINGS REMAIN UNDER 200.     Endocrinology:  Diabetes - Insulins Failed - 06/10/2018 12:08 PM      Failed - HBA1C is between 0 and 7.9 and within 180 days    Hgb A1c MFr Bld  Date Value Ref Range Status  05/04/2017 >15.5 (H) 4.8 - 5.6 % Final    Comment:    **Verified by repeat analysis**          Prediabetes: 5.7 - 6.4          Diabetes: >6.4          Glycemic control for adults with diabetes: <7.0          Failed - Valid encounter within last 6 months    Recent Outpatient Visits          1 year ago Essential hypertension   [facility], Ranell Patrick, MD   1 year ago Type 2 diabetes mellitus with hyperglycemia, with long-term current use of insulin [facility])   [facility], Ranell Patrick, MD   1 year ago Type 2 diabetes mellitus with hyperglycemia, with long-term current use of insulin [facility])   [facility], Ranell Patrick, MD   2 years ago Annual physical exam   [facility] at [facility], [facility]age, [place]   2 years ago Screening-pulmonary TB   [facility] at [place], Carroll [facility], [place]           . rosuvastatin (CRESTOR) 10 MG tablet [Pharmacy Med Name: ROSUVASTATIN CALCIUM 10 MG 10 TAB] 90 tablet 0    Sig: TAKE 1 TABLET BY MOUTH ONCE DAILY     Cardiovascular:  Antilipid - Statins Failed - 06/10/2018 12:08 PM      Failed - Total Cholesterol in normal range and within 360 days    Cholesterol, Total  Date Value Ref Range Status   05/04/2017 203 (H) 100 - 199 mg/dL Final         Failed - LDL in normal range and within 360 days    LDL Calculated  Date Value Ref Range Status  05/04/2017 139 (H) 0 - 99 mg/dL Final         Failed - HDL in normal range and within 360 days    HDL  Date Value Ref Range Status  05/04/2017 44 >39 mg/dL Final         Failed - Triglycerides in normal range and within 360 days    Triglycerides  Date Value Ref Range Status  05/04/2017 98 0 - 149 mg/dL Final         Failed - Valid encounter within last 12 months    Recent Outpatient Visits          1 year ago Essential hypertension   [facility], Ranell Patrick, MD   1 year ago Type 2 diabetes mellitus with hyperglycemia, with long-term current use of insulin [facility])   [facility], Ackerly  R, MD   1 year ago Type 2 diabetes mellitus with hyperglycemia, with long-term current use of insulin [facility])   [facility], Ranell Patrick, MD   2 years ago Annual physical exam   Primary Care at [place], [place]   2 years ago Screening-pulmonary TB   Primary Care at [facility], Carroll S[facility], [place] - Patient is not pregnant    . metFORMIN (GLUCOPHAGE) 1000 MG tablet [Pharmacy Med Name: metFORMIN HCL 1000 MG TABS 1000 TAB] 180 tablet 1    Sig: TAKE 1 TABLET BY MOUTH 2 TIMES DAILY WITH A MEAL.     Endocrinology:  Diabetes - Biguanides Failed - 06/10/2018 12:08 PM      Failed - Cr in normal range and within 360 days    Creat  Date Value Ref Range Status  05/27/2016 1.32 0.70 - 1.33 mg/dL Final    Comment:      For patients > or = 58 years of age: The upper reference limit for Creatinine is approximately 13% higher for people identified as African-American.      Creatinine, Ser  Date Value Ref Range Status  05/04/2017 1.01 0.76 - 1.27 mg/dL Final         Failed - HBA1C is between 0 and 7.9 and within 180 days    Hgb A1c MFr Bld  Date Value Ref Range  Status  05/04/2017 >15.5 (H) 4.8 - 5.6 % Final    Comment:    **Verified by repeat analysis**          Prediabetes: 5.7 - 6.4          Diabetes: >6.4          Glycemic control for adults with diabetes: <7.0          Failed - eGFR in normal range and within 360 days    GFR, Est African American  Date Value Ref Range Status  05/27/2016 69 >=60 mL/min Final   GFR calc Af Amer  Date Value Ref Range Status  05/04/2017 95 >59 mL/min/1.73 Final   GFR, Est Non African American  Date Value Ref Range Status  05/27/2016 60 >=60 mL/min Final   GFR calc non Af Amer  Date Value Ref Range Status  05/04/2017 82 >59 mL/min/1.73 Final         Failed - Valid encounter within last 6 months    Recent Outpatient Visits          1 year ago Essential hypertension   [facility], Ranell Patrick, MD   1 year ago Type 2 diabetes mellitus with hyperglycemia, with long-term current use of insulin [facility])   [facility], Ranell Patrick, MD   1 year ago Type 2 diabetes mellitus with hyperglycemia, with long-term current use of insulin [facility])   [facility], Ranell Patrick, MD   2 years ago Annual physical exam   Primary [facility] at [facility], [facility], [place]   2 years ago Screening-pulmonary TB   [facility] at [place], Carroll Sage, FNP           . amLODipine ([place]) 5 MG tablet [Pharmacy Med Name: AMLODIPINE BESYLATE 5 MG TA 5 TAB] 90 tablet 1    Sig: TAKE 1 TABLET BY MOUTH DAILY.     Cardiovascular:  Calcium Channel Blockers Failed - 06/10/2018 12:08 PM  Failed - Last BP in normal range    BP Readings from Last 1 Encounters:  06/03/17 136/90         Failed - Valid encounter within last 6 months    Recent Outpatient Visits          1 year ago Essential hypertension   [facility], Ranell Patrick, MD   1 year ago Type 2 diabetes mellitus with hyperglycemia, with long-term current use of insulin [facility])   [facility], Ranell Patrick, MD   1 year ago Type 2 diabetes mellitus with hyperglycemia, with long-term current use of insulin [facility])   [facility], Ranell Patrick, MD   2 years ago Annual physical exam   Primary Care at [facility], [facility], [place]   2 years ago Screening-pulmonary TB   Primary Care at [place], [facility], [place]           . JANUVIA 100 MG tablet [Pharmacy Med Name: JANUVIA 100 MG TABLET 100 TAB] 90 tablet 1    Sig: TAKE 1 TABLET BY MOUTH ONCE DAILY     Endocrinology:  Diabetes - DPP-4 Inhibitors Failed - 06/10/2018 12:08 PM      Failed - HBA1C is between 0 and 7.9 and within 180 days    Hgb A1c MFr Bld  Date Value Ref Range Status  05/04/2017 >15.5 (H) 4.8 - 5.6 % Final    Comment:    **Verified by repeat analysis**          Prediabetes: 5.7 - 6.4          Diabetes: >6.4          Glycemic control for adults with diabetes: <7.0          Failed - Cr in normal range and within 360 days    Creat  Date Value Ref Range Status  05/27/2016 1.32 0.70 - 1.33 mg/dL Final    Comment:      For patients > or = 58 years of age: The upper reference limit for Creatinine is approximately 13% higher for people identified as African-American.      Creatinine, Ser  Date Value Ref Range Status  05/04/2017 1.01 0.76 - 1.27 mg/dL Final         Failed - Valid encounter within last 6 months    Recent Outpatient Visits          1 year ago Essential hypertension   [facility], Ranell Patrick, MD   1 year ago Type 2 diabetes mellitus with hyperglycemia, with long-term current use of insulin [facility])   [facility], Ranell Patrick, MD   1 year ago Type 2 diabetes mellitus with hyperglycemia, with long-term current use of insulin [facility])   [facility] at [facility], Ranell Patrick, MD   2 years ago Annual physical exam   Primary Care at [facility], [facility], [place]   2 years ago Screening-pulmonary TB   Primary [facility] at [place], [facility], FNP           . losartan (COZAAR) 100 MG tablet [Pharmacy Med Name: LOSARTAN POTASSIUM 100 MG T 100 TAB] 90 tablet 1    Sig: TAKE 1 TABLET BY MOUTH ONCE DAILY     Cardiovascular:  Angiotensin Receptor Blockers Failed - 06/10/2018 12:08 PM      Failed - Cr in normal range and within 180 days  Creat  Date Value Ref Range Status  05/27/2016 1.32 0.70 - 1.33 mg/dL Final    Comment:      For patients > or = 58 years of age: The upper reference limit for Creatinine is approximately 13% higher for people identified as African-American.      Creatinine, Ser  Date Value Ref Range Status  05/04/2017 1.01 0.76 - 1.27 mg/dL Final         Failed - K in normal range and within 180 days    Potassium  Date Value Ref Range Status  05/04/2017 4.3 3.5 - 5.2 mmol/L Final         Failed - Last BP in normal range    BP Readings from Last 1 Encounters:  06/03/17 136/90         Failed - Valid encounter within last 6 months    Recent Outpatient Visits          1 year ago Essential hypertension   [facility], Ranell Patrick, MD   1 year ago Type 2 diabetes mellitus with hyperglycemia, with long-term current use of insulin [facility])   [facility], Ranell Patrick, MD   1 year ago Type 2 diabetes mellitus with hyperglycemia, with long-term current use of insulin [facility])   [facility], Ranell Patrick, MD   2 years ago Annual physical exam   [facility], [facility], [place]   2 years ago Screening-pulmonary TB   Primary Care at [place], [facility], [place] - Patient is not pregnant

## 2018-07-07 ENCOUNTER — Ambulatory Visit: Payer: 59 | Admitting: Emergency Medicine

## 2018-07-07 ENCOUNTER — Other Ambulatory Visit: Payer: Self-pay

## 2018-07-07 ENCOUNTER — Encounter: Payer: Self-pay | Admitting: Emergency Medicine

## 2018-07-07 VITALS — BP 170/101 | HR 80 | Temp 98.8°F | Resp 16 | Ht 66.0 in | Wt 159.4 lb

## 2018-07-07 DIAGNOSIS — Z23 Encounter for immunization: Secondary | ICD-10-CM

## 2018-07-07 DIAGNOSIS — Z111 Encounter for screening for respiratory tuberculosis: Secondary | ICD-10-CM | POA: Diagnosis not present

## 2018-07-07 DIAGNOSIS — E785 Hyperlipidemia, unspecified: Secondary | ICD-10-CM | POA: Diagnosis not present

## 2018-07-07 DIAGNOSIS — Z9289 Personal history of other medical treatment: Secondary | ICD-10-CM

## 2018-07-07 DIAGNOSIS — E1165 Type 2 diabetes mellitus with hyperglycemia: Secondary | ICD-10-CM

## 2018-07-07 DIAGNOSIS — Z794 Long term (current) use of insulin: Secondary | ICD-10-CM

## 2018-07-07 DIAGNOSIS — I10 Essential (primary) hypertension: Secondary | ICD-10-CM

## 2018-07-07 MED ORDER — AMLODIPINE BESYLATE 5 MG PO TABS: 5.0000 mg | ORAL_TABLET | Freq: Every day | ORAL | 3 refills | Status: DC

## 2018-07-07 MED ORDER — HYDROCHLOROTHIAZIDE 12.5 MG PO TABS: 12.5000 mg | ORAL_TABLET | Freq: Every day | ORAL | 3 refills | Status: DC

## 2018-07-07 MED ORDER — ONETOUCH LANCETS MISC: 3 refills | Status: AC

## 2018-07-07 MED ORDER — INSULIN PEN NEEDLE 32G X 4 MM MISC: 5 refills | Status: DC

## 2018-07-07 MED ORDER — ROSUVASTATIN CALCIUM 10 MG PO TABS: 10.0000 mg | ORAL_TABLET | Freq: Every day | ORAL | 3 refills | Status: DC

## 2018-07-07 MED ORDER — SITAGLIPTIN PHOSPHATE 100 MG PO TABS: 100.0000 mg | ORAL_TABLET | Freq: Every day | ORAL | 3 refills | Status: DC

## 2018-07-07 MED ORDER — METFORMIN HCL 1000 MG PO TABS: ORAL_TABLET | ORAL | 3 refills | Status: DC

## 2018-07-07 MED ORDER — INSULIN GLARGINE 100 UNIT/ML SOLOSTAR PEN: PEN_INJECTOR | SUBCUTANEOUS | 1 refills | Status: DC

## 2018-07-07 MED ORDER — LOSARTAN POTASSIUM 100 MG PO TABS: 100.0000 mg | ORAL_TABLET | Freq: Every day | ORAL | 3 refills | Status: DC

## 2018-07-07 MED FILL — JANUVIA 100 MG TABLET: 100 | 60 days supply | Qty: 60 | Fill #0

## 2018-07-07 MED FILL — LANTUS SOLOSTAR 100 UNITS/M: 100 | 30 days supply | Qty: 9 | Fill #0

## 2018-07-07 MED FILL — ROSUVASTATIN CALCIUM 10 MG: 10 | 90 days supply | Qty: 90 | Fill #0

## 2018-07-07 MED FILL — UNIFINE PENTIPS 32GX5/32: 32G X 4 MM | 90 days supply | Qty: 100 | Fill #0

## 2018-07-07 MED FILL — LOSARTAN POTASSIUM 100 MG T: 100 | 30 days supply | Qty: 30 | Fill #0

## 2018-07-07 MED FILL — UNIFINE PENTIPS 32GX5/32": 32G X 4 MM | 90 days supply | Qty: 100 | Fill #0

## 2018-07-07 MED FILL — AMLODIPINE BESYLATE 5 MG TA: 5 | 90 days supply | Qty: 90 | Fill #0

## 2018-07-07 MED FILL — HYDROCHLOROTHIAZIDE 12.5 MG: 12.5 | 90 days supply | Qty: 90 | Fill #0

## 2018-07-07 MED FILL — FREESTYLE LANCETS: 90 days supply | Qty: 200 | Fill #0

## 2018-07-07 MED FILL — metFORMIN HCL 1000 MG TABS: 1000 | 90 days supply | Qty: 180 | Fill #0

## 2018-07-07 NOTE — Progress Notes (Signed)
[name] 57 y.o.   Chief Complaint  Patient presents with  . Medication Refill    ALL MEDICATIONS and TB SCREENING FOR WORK     HISTORY OF PRESENT ILLNESS: This is a 57 y.o. male with history of diabetes and hypertension first visit with me, here for follow-up and medication refills.  Also needs a tuberculosis screening test for work purposes.  Has been off medication for 2 weeks.  Has a history of noncompliance with medications and appointments with endocrinologist.  HPI   Prior to Admission medications   Medication Sig Start Date End Date Taking? Authorizing Provider  amLODipine (NORVASC) 5 MG tablet TAKE 1 TABLET BY MOUTH DAILY. 06/10/18  Yes Wendie Agreste, MD  CRESTOR 10 MG tablet Take 1 tablet (10 mg total) by mouth daily. 05/11/17  Yes Wendie Agreste, MD  glucose blood [facility] VERIO) test strip Use 2x a day 06/02/15  Yes Daub, Loura Back, MD  hydrochlorothiazide (HYDRODIURIL) 12.5 MG tablet Take 1 tablet (12.5 mg total) by mouth daily. 05/11/17  Yes Wendie Agreste, MD  Insulin Glargine (LANTUS SOLOSTAR) 100 UNIT/ML Solostar Pen INJECT 25 UNITS UNDER THE SKIN AT BEDTIME. INCREASE BY 2 UNITS EVERY 2 DAYS UNTIL READINGS REMAIN UNDER 200. 06/10/18  Yes Wendie Agreste, MD  Insulin Pen Needle (CAREFINE PEN NEEDLES) 32G X 4 MM MISC Use once a day 06/02/15  Yes Daub, Loura Back, MD  JANUVIA 100 MG tablet TAKE 1 TABLET BY MOUTH ONCE DAILY 06/10/18  Yes Wendie Agreste, MD  losartan (COZAAR) 100 MG tablet TAKE 1 TABLET BY MOUTH ONCE DAILY 06/10/18  Yes Wendie Agreste, MD  metFORMIN (GLUCOPHAGE) 1000 MG tablet TAKE 1 TABLET BY MOUTH 2 TIMES DAILY WITH A MEAL. 06/10/18  Yes Wendie Agreste, MD  ONE TOUCH LANCETS MISC Use 2x a day 06/02/15  Yes Darlyne Russian, MD  rosuvastatin (CRESTOR) 10 MG tablet TAKE 1 TABLET BY MOUTH ONCE DAILY 06/10/18  Yes Wendie Agreste, MD  atovaquone-proguanil (MALARONE) 250-100 MG TABS Take 1 tablet by mouth daily. Patient not taking: Reported  on 07/07/2018 05/16/14   Roselee Culver, MD  Dulaglutide 1.5 MG/0.5ML SOPN Inject 0.5 mLs into the skin once a week. Patient not taking: Reported on 07/07/2018 05/27/16   Scot Jun, FNP    Allergies  Allergen Reactions  . Dulaglutide Diarrhea  . Food     pork  . Penicillins     REACTION: swelling and itching    Patient Active Problem List   Diagnosis Date Noted  . Abnormal EKG 10/04/2012  . COLONIC POLYPS, ADENOMATOUS, BENIGN 04/21/2010  . VENTRICULAR HYPERTROPHY, LEFT 03/26/2010  . SLEEP APNEA 02/12/2010  . URINALYSIS, ABNORMAL 02/10/2010  . PHARYNGITIS 12/24/2009  . GOUT, UNSPECIFIED 12/12/2009  . Insulin dependent type 2 diabetes mellitus, uncontrolled ([place]) 11/12/2009  . TINEA VERSICOLOR 10/28/2009  . LUMBAGO 10/28/2009  . SNORING 10/28/2009  . ELECTROCARDIOGRAM, ABNORMAL 10/28/2009  . PENILE LESION 03/18/2009  . WRIST PAIN, LEFT 03/18/2009  . KNEE PAIN, BILATERAL 03/18/2009  . DEPRESSION 06/13/2008  . ROTATOR CUFF SYNDROME, LEFT 06/13/2008  . OTHER ABNORMAL GLUCOSE 10/15/2007  . VIRAL INFECTION, ACUTE 09/27/2007  . PTERYGIUM, BILATERAL 05/08/2007  . ERECTILE DYSFUNCTION, SECONDARY TO MEDICATION 05/08/2007  . Hyperlipidemia 04/21/2007  . HYPERTENSION 04/21/2007  . ALLERGIC RHINITIS, SEASONAL 04/21/2007    Past Medical History:  Diagnosis Date  . Allergy   . Depression   . Diabetes mellitus without complication ([place])    Diet  controlled  . HTN (hypertension)     Past Surgical History:  Procedure Laterality Date  . None      Social History   Socioeconomic History  . Marital status: Married    Spouse name: Not on file  . Number of children: 4  . Years of education: Not on file  . Highest education level: Not on file  Occupational History    Employer: [employer]  Social Needs  . Financial resource strain: Not on file  . Food insecurity:    Worry: Not on file    Inability: Not on file  . Transportation needs:    Medical: Not on  file    Non-medical: Not on file  Tobacco Use  . Smoking status: Never Smoker  . Smokeless tobacco: Never Used  Substance and Sexual Activity  . Alcohol use: Not on file  . Drug use: Not on file  . Sexual activity: Not on file  Lifestyle  . Physical activity:    Days per week: Not on file    Minutes per session: Not on file  . Stress: Not on file  Relationships  . Social connections:    Talks on phone: Not on file    Gets together: Not on file    Attends religious service: Not on file    Active member of club or organization: Not on file    Attends meetings of clubs or organizations: Not on file    Relationship status: Not on file  . Intimate partner violence:    Fear of current or ex partner: Not on file    Emotionally abused: Not on file    Physically abused: Not on file    Forced sexual activity: Not on file  Other Topics Concern  . Not on file  Social History Narrative   Lives with wife.m      Family History  Problem Relation Age of Onset  . Diabetes Mother   . Heart disease Father        CHF     Review of Systems  Constitutional: Negative.  Negative for chills and fever.  HENT: Negative.  Negative for sore throat.   Eyes: Negative.  Negative for blurred vision and double vision.  Respiratory: Negative.  Negative for cough and shortness of breath.   Cardiovascular: Negative.  Negative for chest pain.  Gastrointestinal: Negative.  Negative for abdominal pain, diarrhea, nausea and vomiting.  Genitourinary: Negative.  Negative for dysuria and hematuria.  Musculoskeletal: Negative.  Negative for back pain, myalgias and neck pain.  Skin: Negative.  Negative for rash.  Neurological: Negative.  Negative for dizziness and headaches.  Endo/Heme/Allergies: Negative.   All other systems reviewed and are negative.   Vitals:   07/07/18 1207  BP: (!) 170/101  Pulse: 80  Resp: 16  Temp: 98.8 F (37.1 C)  SpO2: 97%    Physical Exam  Constitutional: He is  oriented to person, place, and time. He appears well-developed and well-nourished.  HENT:  Head: Normocephalic and atraumatic.  Right Ear: External ear normal.  Left Ear: External ear normal.  Nose: Nose normal.  Mouth/Throat: Oropharynx is clear and moist.  Eyes: Pupils are equal, round, and reactive to light. Conjunctivae and EOM are normal.  Neck: Normal range of motion. Neck supple. No thyromegaly present.  Cardiovascular: Normal rate, regular rhythm and normal heart sounds.  Pulmonary/Chest: Effort normal and breath sounds normal.  Abdominal: Soft. He exhibits no distension. There is no tenderness.  Musculoskeletal: Normal  range of motion.  Lymphadenopathy:    He has no cervical adenopathy.  Neurological: He is alert and oriented to person, place, and time. No sensory deficit. He exhibits normal muscle tone.  Skin: Skin is warm and dry. Capillary refill takes less than 2 seconds.  Psychiatric: He has a normal mood and affect. His behavior is normal.  Vitals reviewed.  A total of 25 minutes was spent in the room with the patient, greater than 50% of which was in counseling/coordination of care regarding diabetes and hypertension, treatment, medications, and need for follow-up.   ASSESSMENT & PLAN: Danny Vazquez was seen today for medication refill.  Diagnoses and all orders for this visit:  Essential hypertension -     CBC with Differential/Platelet -     Comprehensive metabolic panel -     Hemoglobin A1c -     amLODipine (NORVASC) 5 MG tablet; Take 1 tablet (5 mg total) by mouth daily. -     hydrochlorothiazide (HYDRODIURIL) 12.5 MG tablet; Take 1 tablet (12.5 mg total) by mouth daily. -     losartan (COZAAR) 100 MG tablet; Take 1 tablet (100 mg total) by mouth daily.  Type 2 diabetes mellitus with hyperglycemia, with long-term current use of insulin (HCC) -     CBC with Differential/Platelet -     Comprehensive metabolic panel -     Hemoglobin A1c -     Ambulatory referral to  Endocrinology -     Insulin Glargine (LANTUS SOLOSTAR) 100 UNIT/ML Solostar Pen; INJECT 25 UNITS UNDER THE SKIN AT BEDTIME. INCREASE BY 2 UNITS EVERY 2 DAYS UNTIL READINGS REMAIN UNDER 200. -     Insulin Pen Needle (CAREFINE PEN NEEDLES) 32G X 4 MM MISC; Use once a day -     ONE TOUCH LANCETS MISC; Use 2x a day -     metFORMIN (GLUCOPHAGE) 1000 MG tablet; TAKE 1 TABLET BY MOUTH 2 TIMES DAILY WITH A MEAL. -     sitaGLIPtin (JANUVIA) 100 MG tablet; Take 1 tablet (100 mg total) by mouth daily.  History of positive PPD -     QuantiFERON-TB Gold Plus  Tuberculosis screening -     QuantiFERON-TB Gold Plus  Hyperlipidemia, unspecified hyperlipidemia type -     rosuvastatin (CRESTOR) 10 MG tablet; Take 1 tablet (10 mg total) by mouth daily.  Need for prophylactic vaccination and inoculation against influenza -     Flu Vaccine QUAD 36+ mos IM    Patient Instructions       If you have lab work done today you will be contacted with your lab results within the next 2 weeks.  If you have not heard from [place] then please contact us. The fastest way to get your results is to register for My Chart.   IF you received an x-ray today, you will receive an invoice from [facility]. Please contact [facility] at [phone] with questions or concerns regarding your invoice.   IF you received labwork today, you will receive an invoice from [facility]. Please contact [facility] at [phone] with questions or concerns regarding your invoice.   Our billing staff will not be able to assist you with questions regarding bills from these companies.  You will be contacted with the lab results as soon as they are available. The fastest way to get your results is to activate your My Chart account. Instructions are located on the last page of this paperwork. If you have not heard from [place] regarding the results  in 2 weeks, please contact this office.     Hypertension Hypertension, commonly called  high blood pressure, is when the force of blood pumping through the arteries is too strong. The arteries are the blood vessels that carry blood from the heart throughout the body. Hypertension forces the heart to work harder to pump blood and may cause arteries to become narrow or stiff. Having untreated or uncontrolled hypertension can cause heart attacks, strokes, kidney disease, and other problems. A blood pressure reading consists of a higher number over a lower number. Ideally, your blood pressure should be below 120/80. The first ("top") number is called the systolic pressure. It is a measure of the pressure in your arteries as your heart beats. The second ("bottom") number is called the diastolic pressure. It is a measure of the pressure in your arteries as the heart relaxes. What are the causes? The cause of this condition is not known. What increases the risk? Some risk factors for high blood pressure are under your control. Others are not. Factors you can change  Smoking.  Having type 2 diabetes mellitus, high cholesterol, or both.  Not getting enough exercise or physical activity.  Being overweight.  Having too much fat, sugar, calories, or salt (sodium) in your diet.  Drinking too much alcohol. Factors that are difficult or impossible to change  Having chronic kidney disease.  Having a family history of high blood pressure.  Age. Risk increases with age.  Race. You may be at higher risk if you are African-American.  Gender. Men are at higher risk than women before age 46. After age 57, women are at higher risk than men.  Having obstructive sleep apnea.  Stress. What are the signs or symptoms? Extremely high blood pressure (hypertensive crisis) may cause:  Headache.  Anxiety.  Shortness of breath.  Nosebleed.  Nausea and vomiting.  Severe chest pain.  Jerky movements you cannot control (seizures).  How is this diagnosed? This condition is diagnosed by  measuring your blood pressure while you are seated, with your arm resting on a surface. The cuff of the blood pressure monitor will be placed directly against the skin of your upper arm at the level of your heart. It should be measured at least twice using the same arm. Certain conditions can cause a difference in blood pressure between your right and left arms. Certain factors can cause blood pressure readings to be lower or higher than normal (elevated) for a short period of time:  When your blood pressure is higher when you are in a health care provider's office than when you are at home, this is called white coat hypertension. Most people with this condition do not need medicines.  When your blood pressure is higher at home than when you are in a health care provider's office, this is called masked hypertension. Most people with this condition may need medicines to control blood pressure.  If you have a high blood pressure reading during one visit or you have normal blood pressure with other risk factors:  You may be asked to return on a different day to have your blood pressure checked again.  You may be asked to monitor your blood pressure at home for 1 week or longer.  If you are diagnosed with hypertension, you may have other blood or imaging tests to help your health care provider understand your overall risk for other conditions. How is this treated? This condition is treated by making healthy lifestyle changes, such  as eating healthy foods, exercising more, and reducing your alcohol intake. Your health care provider may prescribe medicine if lifestyle changes are not enough to get your blood pressure under control, and if:  Your systolic blood pressure is above 130.  Your diastolic blood pressure is above 80.  Your personal target blood pressure may vary depending on your medical conditions, your age, and other factors. Follow these instructions at home: Eating and drinking  Eat a  diet that is high in fiber and potassium, and low in sodium, added sugar, and fat. An example eating plan is called the DASH (Dietary Approaches to Stop Hypertension) diet. To eat this way: ? Eat plenty of fresh fruits and vegetables. Try to fill half of your plate at each meal with fruits and vegetables. ? Eat whole grains, such as whole wheat pasta, brown rice, or whole grain bread. Fill about one quarter of your plate with whole grains. ? Eat or drink low-fat dairy products, such as skim milk or low-fat yogurt. ? Avoid fatty cuts of meat, processed or cured meats, and poultry with skin. Fill about one quarter of your plate with lean proteins, such as fish, chicken without skin, beans, eggs, and tofu. ? Avoid premade and processed foods. These tend to be higher in sodium, added sugar, and fat.  Reduce your daily sodium intake. Most people with hypertension should eat less than 1,500 mg of sodium a day.  Limit alcohol intake to no more than 1 drink a day for nonpregnant women and 2 drinks a day for men. One drink equals 12 oz of beer, 5 oz of wine, or 1 oz of hard liquor. Lifestyle  Work with your health care provider to maintain a healthy body weight or to lose weight. Ask what an ideal weight is for you.  Get at least 30 minutes of exercise that causes your heart to beat faster (aerobic exercise) most days of the week. Activities may include walking, swimming, or biking.  Include exercise to strengthen your muscles (resistance exercise), such as pilates or lifting weights, as part of your weekly exercise routine. Try to do these types of exercises for 30 minutes at least 3 days a week.  Do not use any products that contain nicotine or tobacco, such as cigarettes and e-cigarettes. If you need help quitting, ask your health care provider.  Monitor your blood pressure at home as told by your health care provider.  Keep all follow-up visits as told by your health care provider. This is  important. Medicines  Take over-the-counter and prescription medicines only as told by your health care provider. Follow directions carefully. Blood pressure medicines must be taken as prescribed.  Do not skip doses of blood pressure medicine. Doing this puts you at risk for problems and can make the medicine less effective.  Ask your health care provider about side effects or reactions to medicines that you should watch for. Contact a health care provider if:  You think you are having a reaction to a medicine you are taking.  You have headaches that keep coming back (recurring).  You feel dizzy.  You have swelling in your ankles.  You have trouble with your vision. Get help right away if:  You develop a severe headache or confusion.  You have unusual weakness or numbness.  You feel faint.  You have severe pain in your chest or abdomen.  You vomit repeatedly.  You have trouble breathing. Summary  Hypertension is when the force of blood  pumping through your arteries is too strong. If this condition is not controlled, it may put you at risk for serious complications.  Your personal target blood pressure may vary depending on your medical conditions, your age, and other factors. For most people, a normal blood pressure is less than 120/80.  Hypertension is treated with lifestyle changes, medicines, or a combination of both. Lifestyle changes include weight loss, eating a healthy, low-sodium diet, exercising more, and limiting alcohol. This information is not intended to replace advice given to you by your health care provider. Make sure you discuss any questions you have with your health care provider. Document Released: 08/10/2005 Document Revised: 07/08/2016 Document Reviewed: 07/08/2016 Elsevier Interactive Patient Education  2018 [employer].  Diabetes Mellitus and Nutrition When you have diabetes (diabetes mellitus), it is very important to have healthy eating habits  because your blood sugar (glucose) levels are greatly affected by what you eat and drink. Eating healthy foods in the appropriate amounts, at about the same times every day, can help you:  Control your blood glucose.  Lower your risk of heart disease.  Improve your blood pressure.  Reach or maintain a healthy weight.  Every person with diabetes is different, and each person has different needs for a meal plan. Your health care provider may recommend that you work with a diet and nutrition specialist (dietitian) to make a meal plan that is best for you. Your meal plan may vary depending on factors such as:  The calories you need.  The medicines you take.  Your weight.  Your blood glucose, blood pressure, and cholesterol levels.  Your activity level.  Other health conditions you have, such as heart or kidney disease.  How do carbohydrates affect me? Carbohydrates affect your blood glucose level more than any other type of food. Eating carbohydrates naturally increases the amount of glucose in your blood. Carbohydrate counting is a method for keeping track of how many carbohydrates you eat. Counting carbohydrates is important to keep your blood glucose at a healthy level, especially if you use insulin or take certain oral diabetes medicines. It is important to know how many carbohydrates you can safely have in each meal. This is different for every person. Your dietitian can help you calculate how many carbohydrates you should have at each meal and for snack. Foods that contain carbohydrates include:  Bread, cereal, rice, pasta, and crackers.  Potatoes and corn.  Peas, beans, and lentils.  Milk and yogurt.  Fruit and juice.  Desserts, such as cakes, cookies, ice cream, and candy.  How does alcohol affect me? Alcohol can cause a sudden decrease in blood glucose (hypoglycemia), especially if you use insulin or take certain oral diabetes medicines. Hypoglycemia can be a  life-threatening condition. Symptoms of hypoglycemia (sleepiness, dizziness, and confusion) are similar to symptoms of having too much alcohol. If your health care provider says that alcohol is safe for you, follow these guidelines:  Limit alcohol intake to no more than 1 drink per day for nonpregnant women and 2 drinks per day for men. One drink equals 12 oz of beer, 5 oz of wine, or 1 oz of hard liquor.  Do not drink on an empty stomach.  Keep yourself hydrated with water, diet soda, or unsweetened iced tea.  Keep in mind that regular soda, juice, and other mixers may contain a lot of sugar and must be counted as carbohydrates.  What are tips for following this plan? Reading food labels  Start by  checking the serving size on the label. The amount of calories, carbohydrates, fats, and other nutrients listed on the label are based on one serving of the food. Many foods contain more than one serving per package.  Check the total grams (g) of carbohydrates in one serving. You can calculate the number of servings of carbohydrates in one serving by dividing the total carbohydrates by 15. For example, if a food has 30 g of total carbohydrates, it would be equal to 2 servings of carbohydrates.  Check the number of grams (g) of saturated and trans fats in one serving. Choose foods that have low or no amount of these fats.  Check the number of milligrams (mg) of sodium in one serving. Most people should limit total sodium intake to less than 2,300 mg per day.  Always check the nutrition information of foods labeled as "low-fat" or "nonfat". These foods may be higher in added sugar or refined carbohydrates and should be avoided.  Talk to your dietitian to identify your daily goals for nutrients listed on the label. Shopping  Avoid buying canned, premade, or processed foods. These foods tend to be high in fat, sodium, and added sugar.  Shop around the outside edge of the grocery store. This  includes fresh fruits and vegetables, bulk grains, fresh meats, and fresh dairy. Cooking  Use low-heat cooking methods, such as baking, instead of high-heat cooking methods like deep frying.  Cook using healthy oils, such as olive, canola, or sunflower oil.  Avoid cooking with butter, cream, or high-fat meats. Meal planning  Eat meals and snacks regularly, preferably at the same times every day. Avoid going long periods of time without eating.  Eat foods high in fiber, such as fresh fruits, vegetables, beans, and whole grains. Talk to your dietitian about how many servings of carbohydrates you can eat at each meal.  Eat 4-6 ounces of lean protein each day, such as lean meat, chicken, fish, eggs, or tofu. 1 ounce is equal to 1 ounce of meat, chicken, or fish, 1 egg, or 1/4 cup of tofu.  Eat some foods each day that contain healthy fats, such as avocado, nuts, seeds, and fish. Lifestyle   Check your blood glucose regularly.  Exercise at least 30 minutes 5 or more days each week, or as told by your health care provider.  Take medicines as told by your health care provider.  Do not use any products that contain nicotine or tobacco, such as cigarettes and e-cigarettes. If you need help quitting, ask your health care provider.  Work with a Social worker or diabetes educator to identify strategies to manage stress and any emotional and social challenges. What are some questions to ask my health care provider?  Do I need to meet with a diabetes educator?  Do I need to meet with a dietitian?  What number can I call if I have questions?  When are the best times to check my blood glucose? Where to find more information:  American Diabetes Association: diabetes.org/food-and-fitness/food  Academy of Nutrition and Dietetics: PokerClues.dk  [employer] of Diabetes and Digestive and Kidney Diseases (NIH):  ContactWire.be Summary  A healthy meal plan will help you control your blood glucose and maintain a healthy lifestyle.  Working with a diet and nutrition specialist (dietitian) can help you make a meal plan that is best for you.  Keep in mind that carbohydrates and alcohol have immediate effects on your blood glucose levels. It is important to count carbohydrates and  to use alcohol carefully. This information is not intended to replace advice given to you by your health care provider. Make sure you discuss any questions you have with your health care provider. Document Released: 05/07/2005 Document Revised: 09/14/2016 Document Reviewed: 09/14/2016 Elsevier Interactive Patient Education  2018 [employer].      Agustina Caroli, MD [facility]

## 2018-07-07 NOTE — Patient Instructions (Addendum)
   If you have lab work done today you will be contacted with your lab results within the next 2 weeks.  If you have not heard from us then please contact us. The fastest way to get your results is to register for My Chart.   IF you received an x-ray today, you will receive an invoice from [facility]. Please contact [facility] at [phone] with questions or concerns regarding your invoice.   IF you received labwork today, you will receive an invoice from [facility]. Please contact [facility] at [phone] with questions or concerns regarding your invoice.   Our billing staff will not be able to assist you with questions regarding bills from these companies.  You will be contacted with the lab results as soon as they are available. The fastest way to get your results is to activate your My Chart account. Instructions are located on the last page of this paperwork. If you have not heard from us regarding the results in 2 weeks, please contact this office.     Hypertension Hypertension, commonly called high blood pressure, is when the force of blood pumping through the arteries is too strong. The arteries are the blood vessels that carry blood from the heart throughout the body. Hypertension forces the heart to work harder to pump blood and may cause arteries to become narrow or stiff. Having untreated or uncontrolled hypertension can cause heart attacks, strokes, kidney disease, and other problems. A blood pressure reading consists of a higher number over a lower number. Ideally, your blood pressure should be below 120/80. The first ("top") number is called the systolic pressure. It is a measure of the pressure in your arteries as your heart beats. The second ("bottom") number is called the diastolic pressure. It is a measure of the pressure in your arteries as the heart relaxes. What are the causes? The cause of this condition is not known. What increases the risk? Some  risk factors for high blood pressure are under your control. Others are not. Factors you can change  Smoking.  Having type 2 diabetes mellitus, high cholesterol, or both.  Not getting enough exercise or physical activity.  Being overweight.  Having too much fat, sugar, calories, or salt (sodium) in your diet.  Drinking too much alcohol. Factors that are difficult or impossible to change  Having chronic kidney disease.  Having a family history of high blood pressure.  Age. Risk increases with age.  Race. You may be at higher risk if you are African-American.  Gender. Men are at higher risk than women before age 45. After age 65, women are at higher risk than men.  Having obstructive sleep apnea.  Stress. What are the signs or symptoms? Extremely high blood pressure (hypertensive crisis) may cause:  Headache.  Anxiety.  Shortness of breath.  Nosebleed.  Nausea and vomiting.  Severe chest pain.  Jerky movements you cannot control (seizures).  How is this diagnosed? This condition is diagnosed by measuring your blood pressure while you are seated, with your arm resting on a surface. The cuff of the blood pressure monitor will be placed directly against the skin of your upper arm at the level of your heart. It should be measured at least twice using the same arm. Certain conditions can cause a difference in blood pressure between your right and left arms. Certain factors can cause blood pressure readings to be lower or higher than normal (elevated) for a short period of time:  When   your blood pressure is higher when you are in a health care provider's office than when you are at home, this is called white coat hypertension. Most people with this condition do not need medicines.  When your blood pressure is higher at home than when you are in a health care provider's office, this is called masked hypertension. Most people with this condition may need medicines to control  blood pressure.  If you have a high blood pressure reading during one visit or you have normal blood pressure with other risk factors:  You may be asked to return on a different day to have your blood pressure checked again.  You may be asked to monitor your blood pressure at home for 1 week or longer.  If you are diagnosed with hypertension, you may have other blood or imaging tests to help your health care provider understand your overall risk for other conditions. How is this treated? This condition is treated by making healthy lifestyle changes, such as eating healthy foods, exercising more, and reducing your alcohol intake. Your health care provider may prescribe medicine if lifestyle changes are not enough to get your blood pressure under control, and if:  Your systolic blood pressure is above 130.  Your diastolic blood pressure is above 80.  Your personal target blood pressure may vary depending on your medical conditions, your age, and other factors. Follow these instructions at home: Eating and drinking  Eat a diet that is high in fiber and potassium, and low in sodium, added sugar, and fat. An example eating plan is called the DASH (Dietary Approaches to Stop Hypertension) diet. To eat this way: ? Eat plenty of fresh fruits and vegetables. Try to fill half of your plate at each meal with fruits and vegetables. ? Eat whole grains, such as whole wheat pasta, brown rice, or whole grain bread. Fill about one quarter of your plate with whole grains. ? Eat or drink low-fat dairy products, such as skim milk or low-fat yogurt. ? Avoid fatty cuts of meat, processed or cured meats, and poultry with skin. Fill about one quarter of your plate with lean proteins, such as fish, chicken without skin, beans, eggs, and tofu. ? Avoid premade and processed foods. These tend to be higher in sodium, added sugar, and fat.  Reduce your daily sodium intake. Most people with hypertension should eat less  than 1,500 mg of sodium a day.  Limit alcohol intake to no more than 1 drink a day for nonpregnant women and 2 drinks a day for men. One drink equals 12 oz of beer, 5 oz of wine, or 1 oz of hard liquor. Lifestyle  Work with your health care provider to maintain a healthy body weight or to lose weight. Ask what an ideal weight is for you.  Get at least 30 minutes of exercise that causes your heart to beat faster (aerobic exercise) most days of the week. Activities may include walking, swimming, or biking.  Include exercise to strengthen your muscles (resistance exercise), such as pilates or lifting weights, as part of your weekly exercise routine. Try to do these types of exercises for 30 minutes at least 3 days a week.  Do not use any products that contain nicotine or tobacco, such as cigarettes and e-cigarettes. If you need help quitting, ask your health care provider.  Monitor your blood pressure at home as told by your health care provider.  Keep all follow-up visits as told by your health care provider.   This is important. Medicines  Take over-the-counter and prescription medicines only as told by your health care provider. Follow directions carefully. Blood pressure medicines must be taken as prescribed.  Do not skip doses of blood pressure medicine. Doing this puts you at risk for problems and can make the medicine less effective.  Ask your health care provider about side effects or reactions to medicines that you should watch for. Contact a health care provider if:  You think you are having a reaction to a medicine you are taking.  You have headaches that keep coming back (recurring).  You feel dizzy.  You have swelling in your ankles.  You have trouble with your vision. Get help right away if:  You develop a severe headache or confusion.  You have unusual weakness or numbness.  You feel faint.  You have severe pain in your chest or abdomen.  You vomit  repeatedly.  You have trouble breathing. Summary  Hypertension is when the force of blood pumping through your arteries is too strong. If this condition is not controlled, it may put you at risk for serious complications.  Your personal target blood pressure may vary depending on your medical conditions, your age, and other factors. For most people, a normal blood pressure is less than 120/80.  Hypertension is treated with lifestyle changes, medicines, or a combination of both. Lifestyle changes include weight loss, eating a healthy, low-sodium diet, exercising more, and limiting alcohol. This information is not intended to replace advice given to you by your health care provider. Make sure you discuss any questions you have with your health care provider. Document Released: 08/10/2005 Document Revised: 07/08/2016 Document Reviewed: 07/08/2016 Elsevier Interactive Patient Education  2018 [employer].  Diabetes Mellitus and Nutrition When you have diabetes (diabetes mellitus), it is very important to have healthy eating habits because your blood sugar (glucose) levels are greatly affected by what you eat and drink. Eating healthy foods in the appropriate amounts, at about the same times every day, can help you:  Control your blood glucose.  Lower your risk of heart disease.  Improve your blood pressure.  Reach or maintain a healthy weight.  Every person with diabetes is different, and each person has different needs for a meal plan. Your health care provider may recommend that you work with a diet and nutrition specialist (dietitian) to make a meal plan that is best for you. Your meal plan may vary depending on factors such as:  The calories you need.  The medicines you take.  Your weight.  Your blood glucose, blood pressure, and cholesterol levels.  Your activity level.  Other health conditions you have, such as heart or kidney disease.  How do carbohydrates affect  me? Carbohydrates affect your blood glucose level more than any other type of food. Eating carbohydrates naturally increases the amount of glucose in your blood. Carbohydrate counting is a method for keeping track of how many carbohydrates you eat. Counting carbohydrates is important to keep your blood glucose at a healthy level, especially if you use insulin or take certain oral diabetes medicines. It is important to know how many carbohydrates you can safely have in each meal. This is different for every person. Your dietitian can help you calculate how many carbohydrates you should have at each meal and for snack. Foods that contain carbohydrates include:  Bread, cereal, rice, pasta, and crackers.  Potatoes and corn.  Peas, beans, and lentils.  Milk and yogurt.  Fruit and juice.  Desserts, such as cakes, cookies, ice cream, and candy.  How does alcohol affect me? Alcohol can cause a sudden decrease in blood glucose (hypoglycemia), especially if you use insulin or take certain oral diabetes medicines. Hypoglycemia can be a life-threatening condition. Symptoms of hypoglycemia (sleepiness, dizziness, and confusion) are similar to symptoms of having too much alcohol. If your health care provider says that alcohol is safe for you, follow these guidelines:  Limit alcohol intake to no more than 1 drink per day for nonpregnant women and 2 drinks per day for men. One drink equals 12 oz of beer, 5 oz of wine, or 1 oz of hard liquor.  Do not drink on an empty stomach.  Keep yourself hydrated with water, diet soda, or unsweetened iced tea.  Keep in mind that regular soda, juice, and other mixers may contain a lot of sugar and must be counted as carbohydrates.  What are tips for following this plan? Reading food labels  Start by checking the serving size on the label. The amount of calories, carbohydrates, fats, and other nutrients listed on the label are based on one serving of the food. Many  foods contain more than one serving per package.  Check the total grams (g) of carbohydrates in one serving. You can calculate the number of servings of carbohydrates in one serving by dividing the total carbohydrates by 15. For example, if a food has 30 g of total carbohydrates, it would be equal to 2 servings of carbohydrates.  Check the number of grams (g) of saturated and trans fats in one serving. Choose foods that have low or no amount of these fats.  Check the number of milligrams (mg) of sodium in one serving. Most people should limit total sodium intake to less than 2,300 mg per day.  Always check the nutrition information of foods labeled as "low-fat" or "nonfat". These foods may be higher in added sugar or refined carbohydrates and should be avoided.  Talk to your dietitian to identify your daily goals for nutrients listed on the label. Shopping  Avoid buying canned, premade, or processed foods. These foods tend to be high in fat, sodium, and added sugar.  Shop around the outside edge of the grocery store. This includes fresh fruits and vegetables, bulk grains, fresh meats, and fresh dairy. Cooking  Use low-heat cooking methods, such as baking, instead of high-heat cooking methods like deep frying.  Cook using healthy oils, such as olive, canola, or sunflower oil.  Avoid cooking with butter, cream, or high-fat meats. Meal planning  Eat meals and snacks regularly, preferably at the same times every day. Avoid going long periods of time without eating.  Eat foods high in fiber, such as fresh fruits, vegetables, beans, and whole grains. Talk to your dietitian about how many servings of carbohydrates you can eat at each meal.  Eat 4-6 ounces of lean protein each day, such as lean meat, chicken, fish, eggs, or tofu. 1 ounce is equal to 1 ounce of meat, chicken, or fish, 1 egg, or 1/4 cup of tofu.  Eat some foods each day that contain healthy fats, such as avocado, nuts, seeds,  and fish. Lifestyle   Check your blood glucose regularly.  Exercise at least 30 minutes 5 or more days each week, or as told by your health care provider.  Take medicines as told by your health care provider.  Do not use any products that contain nicotine or tobacco, such as cigarettes and e-cigarettes. If you  need help quitting, ask your health care provider.  Work with a Social worker or diabetes educator to identify strategies to manage stress and any emotional and social challenges. What are some questions to ask my health care provider?  Do I need to meet with a diabetes educator?  Do I need to meet with a dietitian?  What number can I call if I have questions?  When are the best times to check my blood glucose? Where to find more information:  American Diabetes Association: diabetes.org/food-and-fitness/food  Academy of Nutrition and Dietetics: PokerClues.dk  [employer] of Diabetes and Digestive and Kidney Diseases (NIH): ContactWire.be Summary  A healthy meal plan will help you control your blood glucose and maintain a healthy lifestyle.  Working with a diet and nutrition specialist (dietitian) can help you make a meal plan that is best for you.  Keep in mind that carbohydrates and alcohol have immediate effects on your blood glucose levels. It is important to count carbohydrates and to use alcohol carefully. This information is not intended to replace advice given to you by your health care provider. Make sure you discuss any questions you have with your health care provider. Document Released: 05/07/2005 Document Revised: 09/14/2016 Document Reviewed: 09/14/2016 Elsevier Interactive Patient Education  [employer].

## 2018-07-12 LAB — COMPREHENSIVE METABOLIC PANEL
ALK PHOS: 79 IU/L (ref 39–117)
ALT: 10 IU/L (ref 0–44)
AST: 16 IU/L (ref 0–40)
Albumin/Globulin Ratio: 1.3 (ref 1.2–2.2)
Albumin: 4.2 g/dL (ref 3.5–5.5)
BUN/Creatinine Ratio: 11 (ref 9–20)
BUN: 11 mg/dL (ref 6–24)
Bilirubin Total: 0.6 mg/dL (ref 0.0–1.2)
CO2: 20 mmol/L (ref 20–29)
Calcium: 9.6 mg/dL (ref 8.7–10.2)
Chloride: 93 mmol/L — ABNORMAL LOW (ref 96–106)
Creatinine, Ser: 1 mg/dL (ref 0.76–1.27)
GFR calc Af Amer: 95 mL/min/{1.73_m2} (ref >59)
GFR calc non Af Amer: 83 mL/min/{1.73_m2} (ref >59)
GLUCOSE: 468 mg/dL — AB (ref 65–99)
Globulin, Total: 3.3 g/dL (ref 1.5–4.5)
Potassium: 4.5 mmol/L (ref 3.5–5.2)
Sodium: 135 mmol/L (ref 134–144)
Total Protein: 7.5 g/dL (ref 6.0–8.5)

## 2018-07-12 LAB — CBC WITH DIFFERENTIAL/PLATELET
BASOS ABS: 0 10*3/uL (ref 0.0–0.2)
Basos: 1 %
EOS (ABSOLUTE): 0 10*3/uL (ref 0.0–0.4)
Eos: 1 %
Hematocrit: 48.6 % (ref 37.5–51.0)
Hemoglobin: 17.1 g/dL (ref 13.0–17.7)
Immature Grans (Abs): 0 10*3/uL (ref 0.0–0.1)
Immature Granulocytes: 0 %
LYMPHS ABS: 1.6 10*3/uL (ref 0.7–3.1)
Lymphs: 46 %
MCH: 30.8 pg (ref 26.6–33.0)
MCHC: 35.2 g/dL (ref 31.5–35.7)
MCV: 88 fL (ref 79–97)
Monocytes Absolute: 0.3 10*3/uL (ref 0.1–0.9)
Monocytes: 8 %
NEUTROS ABS: 1.5 10*3/uL (ref 1.4–7.0)
Neutrophils: 44 %
PLATELETS: 225 10*3/uL (ref 150–450)
RBC: 5.55 x10E6/uL (ref 4.14–5.80)
RDW: 12.6 % (ref 12.3–15.4)
WBC: 3.5 10*3/uL (ref 3.4–10.8)

## 2018-07-12 LAB — QUANTIFERON-TB GOLD PLUS
QUANTIFERON TB1 AG VALUE: 0.06 [IU]/mL
QuantiFERON Mitogen Value: >10 IU/mL
QuantiFERON Nil Value: 0.09 IU/mL
QuantiFERON TB2 Ag Value: 0.07 IU/mL
QuantiFERON-TB Gold Plus: NEGATIVE

## 2018-07-12 LAB — HEMOGLOBIN A1C: Est. average glucose Bld gHb Est-mCnc: >398 mg/dL

## 2018-07-13 ENCOUNTER — Telehealth: Payer: Self-pay | Admitting: *Deleted

## 2018-07-13 ENCOUNTER — Encounter: Payer: Self-pay | Admitting: Radiology

## 2018-07-13 NOTE — Telephone Encounter (Signed)
Faxed prescription request to [facility] for [facility] lancets,meter and test strips. Confirmation page received at 2:22 pm

## 2018-07-18 ENCOUNTER — Encounter: Payer: Self-pay | Admitting: Endocrinology

## 2018-07-19 ENCOUNTER — Other Ambulatory Visit: Payer: Self-pay | Admitting: *Deleted

## 2018-07-19 ENCOUNTER — Other Ambulatory Visit: Payer: Self-pay | Admitting: Emergency Medicine

## 2018-07-19 DIAGNOSIS — Z794 Long term (current) use of insulin: Principal | ICD-10-CM

## 2018-07-19 DIAGNOSIS — E1165 Type 2 diabetes mellitus with hyperglycemia: Secondary | ICD-10-CM

## 2018-07-19 NOTE — Telephone Encounter (Signed)
Pharmacy requesting a prescription for a meter and strips.

## 2018-07-22 ENCOUNTER — Telehealth: Payer: Self-pay | Admitting: Emergency Medicine

## 2018-07-23 ENCOUNTER — Other Ambulatory Visit: Payer: Self-pay

## 2018-07-23 ENCOUNTER — Telehealth: Payer: Self-pay

## 2018-07-23 MED ORDER — BLOOD GLUCOSE MONITOR KIT: PACK | 0 refills | Status: DC

## 2018-07-23 NOTE — Telephone Encounter (Signed)
Requesting script for glucose meter and test strips. Sent to [facility].

## 2018-07-23 NOTE — Telephone Encounter (Signed)
My chary message

## 2018-07-25 ENCOUNTER — Other Ambulatory Visit: Payer: Self-pay

## 2018-07-25 MED ORDER — BLOOD GLUCOSE MONITOR KIT: PACK | 0 refills | Status: DC

## 2018-07-25 MED ORDER — BLOOD GLUCOSE MONITOR KIT: PACK | 0 refills | Status: AC

## 2018-07-25 MED FILL — FREESTYLE LITE METER: 20 days supply | Qty: 1 | Fill #0

## 2018-07-25 MED FILL — FREESTYLE LITE TEST STRIP: 25 days supply | Qty: 100 | Fill #0

## 2018-08-03 ENCOUNTER — Other Ambulatory Visit: Payer: Self-pay

## 2018-08-03 ENCOUNTER — Encounter: Payer: Self-pay | Admitting: Emergency Medicine

## 2018-08-03 ENCOUNTER — Ambulatory Visit (INDEPENDENT_AMBULATORY_CARE_PROVIDER_SITE_OTHER): Payer: 59 | Admitting: Emergency Medicine

## 2018-08-03 VITALS — BP 161/100 | HR 86 | Temp 98.5°F | Resp 16 | Ht 66.25 in | Wt 164.8 lb

## 2018-08-03 DIAGNOSIS — Z1329 Encounter for screening for other suspected endocrine disorder: Secondary | ICD-10-CM

## 2018-08-03 DIAGNOSIS — Z794 Long term (current) use of insulin: Secondary | ICD-10-CM

## 2018-08-03 DIAGNOSIS — Z130 Encounter for screening for diseases of the blood and blood-forming organs and certain disorders involving the immune mechanism: Secondary | ICD-10-CM

## 2018-08-03 DIAGNOSIS — I10 Essential (primary) hypertension: Secondary | ICD-10-CM | POA: Diagnosis not present

## 2018-08-03 DIAGNOSIS — E1165 Type 2 diabetes mellitus with hyperglycemia: Secondary | ICD-10-CM | POA: Diagnosis not present

## 2018-08-03 DIAGNOSIS — E785 Hyperlipidemia, unspecified: Secondary | ICD-10-CM | POA: Diagnosis not present

## 2018-08-03 DIAGNOSIS — Z0001 Encounter for general adult medical examination with abnormal findings: Secondary | ICD-10-CM

## 2018-08-03 DIAGNOSIS — Z13220 Encounter for screening for lipoid disorders: Secondary | ICD-10-CM | POA: Diagnosis not present

## 2018-08-03 DIAGNOSIS — Z13228 Encounter for screening for other metabolic disorders: Secondary | ICD-10-CM | POA: Diagnosis not present

## 2018-08-03 NOTE — Patient Instructions (Addendum)

## 2018-08-03 NOTE — Progress Notes (Signed)
Danny Vazquez 57 y.o.   Chief Complaint  Patient presents with  . Annual Exam    HISTORY OF PRESENT ILLNESS: This is a 57 y.o. male here for his annual exam.  Has no complaints or medical concerns.  Diabetic and hypertensive.  On multiple medications. Has appointment to see endocrinologist next Friday, in 2 days. Did not take blood pressure medications this morning.  HPI   Prior to Admission medications   Medication Sig Start Date End Date Taking? Authorizing Provider  amLODipine (NORVASC) 5 MG tablet Take 1 tablet (5 mg total) by mouth daily. 07/07/18 10/05/18 Yes Ruweyda Macknight, Ines Bloomer, MD  atovaquone-proguanil (MALARONE) 250-100 MG TABS Take 1 tablet by mouth daily. 05/16/14  Yes Roselee Culver, MD  blood glucose meter kit and supplies KIT Use up to four times daily as directed. Dispense based on pt & ins preference. Dx: E11.65, Z79.4 07/25/18  Yes Horald Pollen, MD  glucose blood [facility] VERIO) test strip Use 2x a day 06/02/15  Yes Daub, Loura Back, MD  hydrochlorothiazide (HYDRODIURIL) 12.5 MG tablet Take 1 tablet (12.5 mg total) by mouth daily. 07/07/18 10/05/18 Yes Hend Mccarrell, Ines Bloomer, MD  Insulin Glargine (LANTUS SOLOSTAR) 100 UNIT/ML Solostar Pen INJECT 25 UNITS UNDER THE SKIN AT BEDTIME. INCREASE BY 2 UNITS EVERY 2 DAYS UNTIL READINGS REMAIN UNDER 200. 07/07/18  Yes Aimy Sweeting, Ines Bloomer, MD  losartan (COZAAR) 100 MG tablet Take 1 tablet (100 mg total) by mouth daily. 07/07/18 10/05/18 Yes Lezley Bedgood, Ines Bloomer, MD  metFORMIN (GLUCOPHAGE) 1000 MG tablet TAKE 1 TABLET BY MOUTH 2 TIMES DAILY WITH A MEAL. 07/07/18  Yes Darrio Bade, Ines Bloomer, MD  rosuvastatin (CRESTOR) 10 MG tablet Take 1 tablet (10 mg total) by mouth daily. 07/07/18  Yes Paxson Harrower, Ines Bloomer, MD  sitaGLIPtin (JANUVIA) 100 MG tablet Take 1 tablet (100 mg total) by mouth daily. 07/07/18 10/05/18 Yes Sailor Haughn, Ines Bloomer, MD  Dulaglutide 1.5 MG/0.5ML SOPN Inject 0.5 mLs into the skin once a week. Patient  not taking: Reported on 08/03/2018 05/27/16   Scot Jun, FNP  Insulin Pen Needle (CAREFINE PEN NEEDLES) 32G X 4 MM MISC Use once a day 07/07/18   Horald Pollen, MD  ONE [facility] LANCETS MISC Use 2x a day 07/07/18   Horald Pollen, MD    Allergies  Allergen Reactions  . Dulaglutide Diarrhea  . Food     pork  . Penicillins     REACTION: swelling and itching    Patient Active Problem List   Diagnosis Date Noted  . Abnormal EKG 10/04/2012  . COLONIC POLYPS, ADENOMATOUS, BENIGN 04/21/2010  . VENTRICULAR HYPERTROPHY, LEFT 03/26/2010  . SLEEP APNEA 02/12/2010  . URINALYSIS, ABNORMAL 02/10/2010  . PHARYNGITIS 12/24/2009  . GOUT, UNSPECIFIED 12/12/2009  . Insulin dependent type 2 diabetes mellitus, uncontrolled ([place]) 11/12/2009  . TINEA VERSICOLOR 10/28/2009  . LUMBAGO 10/28/2009  . SNORING 10/28/2009  . ELECTROCARDIOGRAM, ABNORMAL 10/28/2009  . PENILE LESION 03/18/2009  . WRIST PAIN, LEFT 03/18/2009  . KNEE PAIN, BILATERAL 03/18/2009  . DEPRESSION 06/13/2008  . ROTATOR CUFF SYNDROME, LEFT 06/13/2008  . OTHER ABNORMAL GLUCOSE 10/15/2007  . VIRAL INFECTION, ACUTE 09/27/2007  . PTERYGIUM, BILATERAL 05/08/2007  . ERECTILE DYSFUNCTION, SECONDARY TO MEDICATION 05/08/2007  . Hyperlipidemia 04/21/2007  . HYPERTENSION 04/21/2007  . ALLERGIC RHINITIS, SEASONAL 04/21/2007    Past Medical History:  Diagnosis Date  . Allergy   . Depression   . Diabetes mellitus without complication ([place])    Diet controlled  .  HTN (hypertension)     Past Surgical History:  Procedure Laterality Date  . None      Social History   Socioeconomic History  . Marital status: Married    Spouse name: Not on file  . Number of children: 4  . Years of education: Not on file  . Highest education level: Not on file  Occupational History    Employer: [employer]  Social Needs  . Financial resource strain: Not on file  . Food insecurity:    Worry: Not on file    Inability: Not  on file  . Transportation needs:    Medical: Not on file    Non-medical: Not on file  Tobacco Use  . Smoking status: Never Smoker  . Smokeless tobacco: Never Used  Substance and Sexual Activity  . Alcohol use: Not on file  . Drug use: Not on file  . Sexual activity: Not on file  Lifestyle  . Physical activity:    Days per week: Not on file    Minutes per session: Not on file  . Stress: Not on file  Relationships  . Social connections:    Talks on phone: Not on file    Gets together: Not on file    Attends religious service: Not on file    Active member of club or organization: Not on file    Attends meetings of clubs or organizations: Not on file    Relationship status: Not on file  . Intimate partner violence:    Fear of current or ex partner: Not on file    Emotionally abused: Not on file    Physically abused: Not on file    Forced sexual activity: Not on file  Other Topics Concern  . Not on file  Social History Narrative   Lives with wife.m      Family History  Problem Relation Age of Onset  . Diabetes Mother   . Heart disease Father        CHF     Review of Systems  Constitutional: Negative.  Negative for chills and fever.  HENT: Negative.  Negative for hearing loss.   Eyes: Negative.  Negative for blurred vision and double vision.  Respiratory: Negative.  Negative for cough and shortness of breath.   Cardiovascular: Negative.  Negative for chest pain and palpitations.  Gastrointestinal: Negative.  Negative for abdominal pain, blood in stool, diarrhea, melena, nausea and vomiting.  Genitourinary: Negative.  Negative for dysuria and hematuria.  Musculoskeletal: Negative for myalgias and neck pain.  Skin: Negative.  Negative for rash.  Neurological: Negative.  Negative for dizziness and headaches.  Endo/Heme/Allergies: Negative.   All other systems reviewed and are negative.   Vitals:   08/03/18 0840  BP: (!) 161/100  Pulse: 86  Resp: 16  Temp: 98.5 F  (36.9 C)  SpO2: 98%    Physical Exam  Constitutional: He is oriented to person, place, and time. He appears well-developed and well-nourished.  HENT:  Head: Normocephalic and atraumatic.  Nose: Nose normal.  Mouth/Throat: Oropharynx is clear and moist.  Eyes: Pupils are equal, round, and reactive to light. Conjunctivae and EOM are normal.  Neck: Normal range of motion. Neck supple.  Cardiovascular: Normal rate and regular rhythm.  Pulmonary/Chest: Effort normal and breath sounds normal.  Abdominal: Soft. Bowel sounds are normal. He exhibits no distension. There is no tenderness.  Musculoskeletal: Normal range of motion.  Lymphadenopathy:    He has no cervical adenopathy.  Neurological:  He is alert and oriented to person, place, and time. No sensory deficit. He exhibits normal muscle tone.  Skin: Skin is warm and dry. Capillary refill takes less than 2 seconds.  Psychiatric: He has a normal mood and affect. His behavior is normal.  Vitals reviewed.    ASSESSMENT & PLAN: Stella was seen today for annual exam.  Diagnoses and all orders for this visit:  Encounter for general adult medical examination with abnormal findings -     CBC with Differential -     Comprehensive metabolic panel  Essential hypertension -     Comprehensive metabolic panel -     Lipid panel  Type 2 diabetes mellitus with hyperglycemia, with long-term current use of insulin (HCC) -     Hemoglobin A1c -     Lipid panel  Hyperlipidemia, unspecified hyperlipidemia type -     Lipid panel  Screening for deficiency anemia -     CBC with Differential  Screening for lipoid disorders  Screening for endocrine, metabolic and immunity disorder    Patient Instructions       If you have lab work done today you will be contacted with your lab results within the next 2 weeks.  If you have not heard from [place] then please contact us. The fastest way to get your results is to register for My Chart.   IF you  received an x-ray today, you will receive an invoice from [facility]. Please contact [facility] at [phone] with questions or concerns regarding your invoice.   IF you received labwork today, you will receive an invoice from [facility]. Please contact [facility] at [phone] with questions or concerns regarding your invoice.   Our billing staff will not be able to assist you with questions regarding bills from these companies.  You will be contacted with the lab results as soon as they are available. The fastest way to get your results is to activate your My Chart account. Instructions are located on the last page of this paperwork. If you have not heard from [place] regarding the results in 2 weeks, please contact this office.      Health Maintenance, Male A healthy lifestyle and preventive care is important for your health and wellness. Ask your health care provider about what schedule of regular examinations is right for you. What should I know about weight and diet? Eat a Healthy Diet  Eat plenty of vegetables, fruits, whole grains, low-fat dairy products, and lean protein.  Do not eat a lot of foods high in solid fats, added sugars, or salt.  Maintain a Healthy Weight Regular exercise can help you achieve or maintain a healthy weight. You should:  Do at least 150 minutes of exercise each week. The exercise should increase your heart rate and make you sweat (moderate-intensity exercise).  Do strength-training exercises at least twice a week.  Watch Your Levels of Cholesterol and Blood Lipids  Have your blood tested for lipids and cholesterol every 5 years starting at 58 years of age. If you are at high risk for heart disease, you should start having your blood tested when you are 58 years old. You may need to have your cholesterol levels checked more often if: ? Your lipid or cholesterol levels are high. ? You are older than 58 years of age. ? You are at high  risk for heart disease.  What should I know about cancer screening? Many types of cancers can be detected early and may  often be prevented. Lung Cancer  You should be screened every year for lung cancer if: ? You are a current smoker who has smoked for at least 30 years. ? You are a former smoker who has quit within the past 15 years.  Talk to your health care provider about your screening options, when you should start screening, and how often you should be screened.  Colorectal Cancer  Routine colorectal cancer screening usually begins at 58 years of age and should be repeated every 5-10 years until you are 58 years old. You may need to be screened more often if early forms of precancerous polyps or small growths are found. Your health care provider may recommend screening at an earlier age if you have risk factors for colon cancer.  Your health care provider may recommend using home test kits to check for hidden blood in the stool.  A small camera at the end of a tube can be used to examine your colon (sigmoidoscopy or colonoscopy). This checks for the earliest forms of colorectal cancer.  Prostate and Testicular Cancer  Depending on your age and overall health, your health care provider may do certain tests to screen for prostate and testicular cancer.  Talk to your health care provider about any symptoms or concerns you have about testicular or prostate cancer.  Skin Cancer  Check your skin from head to toe regularly.  Tell your health care provider about any new moles or changes in moles, especially if: ? There is a change in a mole's size, shape, or color. ? You have a mole that is larger than a pencil eraser.  Always use sunscreen. Apply sunscreen liberally and repeat throughout the day.  Protect yourself by wearing long sleeves, pants, a wide-brimmed hat, and sunglasses when outside.  What should I know about heart disease, diabetes, and high blood pressure?  If you  are 52-35 years of age, have your blood pressure checked every 3-5 years. If you are 19 years of age or older, have your blood pressure checked every year. You should have your blood pressure measured twice-once when you are at a hospital or clinic, and once when you are not at a hospital or clinic. Record the average of the two measurements. To check your blood pressure when you are not at a hospital or clinic, you can use: ? An automated blood pressure machine at a pharmacy. ? A home blood pressure monitor.  Talk to your health care provider about your target blood pressure.  If you are between 105-75 years old, ask your health care provider if you should take aspirin to prevent heart disease.  Have regular diabetes screenings by checking your fasting blood sugar level. ? If you are at a normal weight and have a low risk for diabetes, have this test once every three years after the age of 36. ? If you are overweight and have a high risk for diabetes, consider being tested at a younger age or more often.  A one-time screening for abdominal aortic aneurysm (AAA) by ultrasound is recommended for men aged 8-75 years who are current or former smokers. What should I know about preventing infection? Hepatitis B If you have a higher risk for hepatitis B, you should be screened for this virus. Talk with your health care provider to find out if you are at risk for hepatitis B infection. Hepatitis C Blood testing is recommended for:  Everyone born from [birth year] through 1966.  Anyone with known risk  factors for hepatitis C.  Sexually Transmitted Diseases (STDs)  You should be screened each year for STDs including gonorrhea and chlamydia if: ? You are sexually active and are younger than 58 years of age. ? You are older than 58 years of age and your health care provider tells you that you are at risk for this type of infection. ? Your sexual activity has changed since you were last screened and you are  at an increased risk for chlamydia or gonorrhea. Ask your health care provider if you are at risk.  Talk with your health care provider about whether you are at high risk of being infected with HIV. Your health care provider may recommend a prescription medicine to help prevent HIV infection.  What else can I do?  Schedule regular health, dental, and eye exams.  Stay current with your vaccines (immunizations).  Do not use any tobacco products, such as cigarettes, chewing tobacco, and e-cigarettes. If you need help quitting, ask your health care provider.  Limit alcohol intake to no more than 2 drinks per day. One drink equals 12 ounces of beer, 5 ounces of wine, or 1 ounces of hard liquor.  Do not use street drugs.  Do not share needles.  Ask your health care provider for help if you need support or information about quitting drugs.  Tell your health care provider if you often feel depressed.  Tell your health care provider if you have ever been abused or do not feel safe at home. This information is not intended to replace advice given to you by your health care provider. Make sure you discuss any questions you have with your health care provider. Document Released: 02/06/2008 Document Revised: 04/08/2016 Document Reviewed: 05/14/2015 Elsevier Interactive Patient Education  2018 Elsevier Inc.      Agustina Caroli, MD [facility]

## 2018-08-04 ENCOUNTER — Encounter: Payer: Self-pay | Admitting: Radiology

## 2018-08-05 ENCOUNTER — Ambulatory Visit: Payer: 59 | Admitting: Internal Medicine

## 2018-08-05 LAB — COMPREHENSIVE METABOLIC PANEL
A/G RATIO: 1.5 (ref 1.2–2.2)
ALT: 12 IU/L (ref 0–44)
AST: 13 IU/L (ref 0–40)
Albumin: 4.2 g/dL (ref 3.5–5.5)
Alkaline Phosphatase: 76 IU/L (ref 39–117)
BUN/Creatinine Ratio: 13 (ref 9–20)
BUN: 13 mg/dL (ref 6–24)
Bilirubin Total: 0.6 mg/dL (ref 0.0–1.2)
CALCIUM: 9.6 mg/dL (ref 8.7–10.2)
CO2: 24 mmol/L (ref 20–29)
Chloride: 100 mmol/L (ref 96–106)
Creatinine, Ser: 1 mg/dL (ref 0.76–1.27)
GFR calc Af Amer: 95 mL/min/{1.73_m2} (ref >59)
GFR calc non Af Amer: 83 mL/min/{1.73_m2} (ref >59)
Globulin, Total: 2.8 g/dL (ref 1.5–4.5)
Glucose: 196 mg/dL — ABNORMAL HIGH (ref 65–99)
POTASSIUM: 4.2 mmol/L (ref 3.5–5.2)
Sodium: 140 mmol/L (ref 134–144)
TOTAL PROTEIN: 7 g/dL (ref 6.0–8.5)

## 2018-08-05 LAB — CBC WITH DIFFERENTIAL/PLATELET
BASOS ABS: 0 10*3/uL (ref 0.0–0.2)
Basos: 1 %
EOS (ABSOLUTE): 0.1 10*3/uL (ref 0.0–0.4)
Eos: 3 %
HEMATOCRIT: 48.8 % (ref 37.5–51.0)
HEMOGLOBIN: 16.4 g/dL (ref 13.0–17.7)
IMMATURE GRANULOCYTES: 0 %
Immature Grans (Abs): 0 10*3/uL (ref 0.0–0.1)
LYMPHS ABS: 2 10*3/uL (ref 0.7–3.1)
LYMPHS: 45 %
MCH: 30 pg (ref 26.6–33.0)
MCHC: 33.6 g/dL (ref 31.5–35.7)
MCV: 89 fL (ref 79–97)
MONOCYTES: 8 %
MONOS ABS: 0.3 10*3/uL (ref 0.1–0.9)
NEUTROS ABS: 1.9 10*3/uL (ref 1.4–7.0)
Neutrophils: 43 %
PLATELETS: 216 10*3/uL (ref 150–450)
RBC: 5.46 x10E6/uL (ref 4.14–5.80)
RDW: 13.1 % (ref 12.3–15.4)
WBC: 4.4 10*3/uL (ref 3.4–10.8)

## 2018-08-05 LAB — LIPID PANEL
CHOL/HDL RATIO: 2.8 ratio (ref 0.0–5.0)
CHOLESTEROL TOTAL: 136 mg/dL (ref 100–199)
HDL: 48 mg/dL (ref >39)
LDL Calculated: 77 mg/dL (ref 0–99)
Triglycerides: 55 mg/dL (ref 0–149)
VLDL CHOLESTEROL CAL: 11 mg/dL (ref 5–40)

## 2018-08-05 LAB — HEMOGLOBIN A1C
Est. average glucose Bld gHb Est-mCnc: >398 mg/dL
Hgb A1c MFr Bld: >15.5 % — ABNORMAL HIGH (ref 4.8–5.6)

## 2018-08-14 ENCOUNTER — Encounter (HOSPITAL_COMMUNITY): Payer: Self-pay | Admitting: *Deleted

## 2018-08-14 ENCOUNTER — Ambulatory Visit (HOSPITAL_COMMUNITY)
Admission: EM | Admit: 2018-08-14 | Discharge: 2018-08-14 | Disposition: A | Discharge status: Home / Self Care | Payer: 59 | Attending: Internal Medicine | Admitting: Internal Medicine

## 2018-08-14 DIAGNOSIS — R030 Elevated blood-pressure reading, without diagnosis of hypertension: Secondary | ICD-10-CM

## 2018-08-14 DIAGNOSIS — E1165 Type 2 diabetes mellitus with hyperglycemia: Secondary | ICD-10-CM

## 2018-08-14 DIAGNOSIS — R81 Glycosuria: Secondary | ICD-10-CM | POA: Diagnosis not present

## 2018-08-14 DIAGNOSIS — I10 Essential (primary) hypertension: Secondary | ICD-10-CM

## 2018-08-14 DIAGNOSIS — N50819 Testicular pain, unspecified: Secondary | ICD-10-CM

## 2018-08-14 DIAGNOSIS — Z794 Long term (current) use of insulin: Secondary | ICD-10-CM | POA: Insufficient documentation

## 2018-08-14 LAB — POCT URINALYSIS DIP (DEVICE)
Bilirubin Urine: NEGATIVE
Glucose, UA: 500 mg/dL — AB
Ketones, ur: NEGATIVE mg/dL
Leukocytes, UA: NEGATIVE
Nitrite: NEGATIVE
Protein, ur: NEGATIVE mg/dL
Specific Gravity, Urine: 1.015 (ref 1.005–1.030)
Urobilinogen, UA: 0.2 mg/dL (ref 0.0–1.0)
pH: 5.5 (ref 5.0–8.0)

## 2018-08-14 LAB — GLUCOSE, CAPILLARY: Glucose-Capillary: 360 mg/dL — ABNORMAL HIGH (ref 70–99)

## 2018-08-14 MED ORDER — INSULIN ASPART 100 UNIT/ML ~~LOC~~ SOLN
8.0000 [IU] | Freq: Once | SUBCUTANEOUS | Status: AC
  Administered 2018-08-14: 8 [IU] via SUBCUTANEOUS

## 2018-08-14 MED ORDER — INSULIN ASPART 100 UNIT/ML ~~LOC~~ SOLN
SUBCUTANEOUS | Status: AC
  Filled 2018-08-14: qty 1

## 2018-08-14 NOTE — ED Triage Notes (Signed)
C/O warmth to right testicle 1 wk ago; this AM noticed swelling and redness to right testicle as well.

## 2018-08-14 NOTE — ED Provider Notes (Signed)
[place]   [id number] 08/14/18 Arrival Time: 1140   CC: Testicular pain and swelling  SUBJECTIVE:  Danny Vazquez is a 57 y.o. male hx significant for HTN, and DM, who presents with testicular pain and swelling x 1 week.  Denies precipitating event or trauma.  In monogamous relationship with wife.  Denies STD exposure.  Reports improvement with apply cool towel to testicle.  Denies similar symptoms in the past. Complains of  Denies fever, chills, lightheadedness, dizziness, HA, nausea, vomiting, chest pain, SOB, abdominal or pelvic pain, penile rashes or lesions.   No LMP for male patient.  ROS: As per HPI.  Past Medical History:  Diagnosis Date  . Allergy   . Diabetes mellitus without complication ([place])    Diet controlled  . HTN (hypertension)    Past Surgical History:  Procedure Laterality Date  . None     Allergies  Allergen Reactions  . Dulaglutide Diarrhea  . Food     pork  . Penicillins     REACTION: swelling and itching   No current facility-administered medications on file prior to encounter.    Current Outpatient Medications on File Prior to Encounter  Medication Sig Dispense Refill  . amLODipine (NORVASC) 5 MG tablet Take 1 tablet (5 mg total) by mouth daily. 90 tablet 3  . atovaquone-proguanil (MALARONE) 250-100 MG TABS Take 1 tablet by mouth daily. 54 tablet 0  . hydrochlorothiazide (HYDRODIURIL) 12.5 MG tablet Take 1 tablet (12.5 mg total) by mouth daily. 90 tablet 3  . Insulin Glargine (LANTUS SOLOSTAR) 100 UNIT/ML Solostar Pen INJECT 25 UNITS UNDER THE SKIN AT BEDTIME. INCREASE BY 2 UNITS EVERY 2 DAYS UNTIL READINGS REMAIN UNDER 200. 12 mL 1  . losartan (COZAAR) 100 MG tablet Take 1 tablet (100 mg total) by mouth daily. 90 tablet 3  . metFORMIN (GLUCOPHAGE) 1000 MG tablet TAKE 1 TABLET BY MOUTH 2 TIMES DAILY WITH A MEAL. 180 tablet 3  . rosuvastatin (CRESTOR) 10 MG tablet Take 1 tablet (10 mg total) by mouth daily. 90 tablet 3  . sitaGLIPtin  (JANUVIA) 100 MG tablet Take 1 tablet (100 mg total) by mouth daily. 90 tablet 3  . blood glucose meter kit and supplies KIT Use up to four times daily as directed. Dispense based on pt & ins preference. Dx: E11.65, Z79.4 1 each 0  . glucose blood (ONETOUCH VERIO) test strip Use 2x a day 200 each 3  . Insulin Pen Needle (CAREFINE PEN NEEDLES) 32G X 4 MM MISC Use once a day 100 each 5  . ONE TOUCH LANCETS MISC Use 2x a day 200 each 3   Social History   Socioeconomic History  . Marital status: Married    Spouse name: Not on file  . Number of children: 4  . Years of education: Not on file  . Highest education level: Not on file  Occupational History    Employer: [employer]  Social Needs  . Financial resource strain: Not on file  . Food insecurity:    Worry: Not on file    Inability: Not on file  . Transportation needs:    Medical: Not on file    Non-medical: Not on file  Tobacco Use  . Smoking status: Never Smoker  . Smokeless tobacco: Never Used  Substance and Sexual Activity  . Alcohol use: Yes    Comment: occasionally  . Drug use: Never  . Sexual activity: Not on file  Lifestyle  . Physical activity:  Days per week: Not on file    Minutes per session: Not on file  . Stress: Not on file  Relationships  . Social connections:    Talks on phone: Not on file    Gets together: Not on file    Attends religious service: Not on file    Active member of club or organization: Not on file    Attends meetings of clubs or organizations: Not on file    Relationship status: Not on file  . Intimate partner violence:    Fear of current or ex partner: Not on file    Emotionally abused: Not on file    Physically abused: Not on file    Forced sexual activity: Not on file  Other Topics Concern  . Not on file  Social History Narrative   Lives with wife.m     Family History  Problem Relation Age of Onset  . Diabetes Mother   . Hypertension Mother   . Heart disease Father         CHF    OBJECTIVE:  Vitals:   08/14/18 1313 08/14/18 1428  BP: (!) 178/117 (!) 168/111  Pulse: 96   Resp: 16   Temp: 98.2 F (36.8 C)   TempSrc: Oral   SpO2: 98%      General appearance: alert, NAD, appears stated age Head: NCAT Throat: lips, mucosa, and tongue normal; teeth and gums normal Lungs: CTA bilaterally without adventitious breath sounds Heart: regular rate and rhythm.  Radial pulses 2+ symmetrical bilaterally Back: no CVA tenderness Abdomen: soft, non-tender; bowel sounds normal; no masses or organomegaly; no guarding or rebound tenderness GU: Chaperone present. Circumcised male; no inguinal LAD; no obvious rashes or lesions; no penile discharge; no testicular masses or nodules appreciated, RT testicle mildly tender to to palpation, no obvious swelling; testicles appear symmetrical in size; no blue dot or bell-clapper deformity  Skin: warm and dry Psychological:  Alert and cooperative. Normal mood and affect.  LABS:  Results for orders placed or performed during the hospital encounter of 08/14/18  Glucose, capillary  Result Value Ref Range   Glucose-Capillary 360 (H) 70 - 99 mg/dL  POCT urinalysis dip (device)  Result Value Ref Range   Glucose, UA 500 (A) NEGATIVE mg/dL   Bilirubin Urine NEGATIVE NEGATIVE   Ketones, ur NEGATIVE NEGATIVE mg/dL   Specific Gravity, Urine 1.015 1.005 - 1.030   Hgb urine dipstick TRACE (A) NEGATIVE   pH 5.5 5.0 - 8.0   Protein, ur NEGATIVE NEGATIVE mg/dL   Urobilinogen, UA 0.2 0.0 - 1.0 mg/dL   Nitrite NEGATIVE NEGATIVE   Leukocytes, UA NEGATIVE NEGATIVE    Labs Reviewed  GLUCOSE, CAPILLARY - Abnormal; Notable for the following components:      Result Value   Glucose-Capillary 360 (*)    All other components within normal limits  POCT URINALYSIS DIP (DEVICE) - Abnormal; Notable for the following components:   Glucose, UA 500 (*)    Hgb urine dipstick TRACE (*)    All other components within normal limits  CBG  MONITORING, ED    ASSESSMENT & PLAN:  1. Testicular discomfort   2. Glycosuria   3. Elevated blood pressure reading   4. Essential hypertension   5. Type 2 diabetes mellitus with hyperglycemia, with long-term current use of insulin [facility])    Discussed patient case with Dr. Marcille Blanco.  Dr. Marcille Blanco also examined patient and recommended close follow up with PCP for testicular pain.  Glucose elevated in  office, per Dr. Marcille Blanco recommendation, 8 U of insulin given in office.  Pt also has elevated blood pressure in office.  Pt took BP meds prior to coming in.  He will follow up with PCP for further evaluation and management of uncontrolled HTN and DM.  Given return and ED precautions.    Meds ordered this encounter  Medications  . insulin aspart (novoLOG) injection 8 Units    Pending: Labs Reviewed  GLUCOSE, CAPILLARY - Abnormal; Notable for the following components:      Result Value   Glucose-Capillary 360 (*)    All other components within normal limits  POCT URINALYSIS DIP (DEVICE) - Abnormal; Notable for the following components:   Glucose, UA 500 (*)    Hgb urine dipstick TRACE (*)    All other components within normal limits  CBG MONITORING, ED    Recommend follow up with PCP for further evaluation and management of testicular pain Ultrasound may be beneficial to rule out other potential causes of testicular pain.  He will follow up with PCP for that.  Blood glucose elevated in office.  Will give 8 units of fast acting insulin in office.  Please recheck in 1-2 hours to ensure blood glucose has decreased Please follow up with PCP for further evaluation and management of hypertension and diabetes Return here or go to ER if you have any new or worsening symptoms  Fever, chills, nausea, vomiting, abdominal pain, worsening testicular pain, testicular swelling, redness, skin changes, discharge from penis, etc...  Reviewed expectations re: course of current medical issues. Questions  answered. Outlined signs and symptoms indicating need for more acute intervention. Patient verbalized understanding. After Visit Summary given.       Lestine Box, PA-C 08/14/18 1500

## 2018-08-14 NOTE — Discharge Instructions (Signed)
Recommend follow up with PCP for further evaluation and management of testicular pain Ultrasound may be beneficial to rule out other potential causes of testicular pain.  He will follow up with PCP for that.  Blood glucose elevated in office.  Will give 8 units of fast acting insulin in office.  Please recheck in 1-2 hours to ensure blood glucose has decreased Please follow up with PCP for further evaluation and management of hypertension and diabetes Return here or go to ER if you have any new or worsening symptoms  Fever, chills, nausea, vomiting, abdominal pain, worsening testicular pain, testicular swelling, redness, skin changes, discharge from penis, etc..Marland Kitchen

## 2018-09-29 ENCOUNTER — Ambulatory Visit: Payer: 59 | Admitting: Emergency Medicine

## 2018-09-30 ENCOUNTER — Ambulatory Visit: Payer: 59 | Admitting: Emergency Medicine

## 2018-11-02 ENCOUNTER — Ambulatory Visit: Payer: Commercial Managed Care - PPO | Admitting: Emergency Medicine

## 2018-11-02 ENCOUNTER — Other Ambulatory Visit: Payer: Self-pay

## 2018-11-02 ENCOUNTER — Encounter: Payer: Self-pay | Admitting: Emergency Medicine

## 2018-11-02 ENCOUNTER — Ambulatory Visit: Payer: 59 | Admitting: Emergency Medicine

## 2018-11-02 VITALS — BP 170/116 | HR 97 | Temp 98.5°F | Ht 66.25 in | Wt 163.4 lb

## 2018-11-02 DIAGNOSIS — B351 Tinea unguium: Secondary | ICD-10-CM

## 2018-11-02 DIAGNOSIS — E785 Hyperlipidemia, unspecified: Secondary | ICD-10-CM | POA: Diagnosis not present

## 2018-11-02 DIAGNOSIS — Z794 Long term (current) use of insulin: Secondary | ICD-10-CM

## 2018-11-02 DIAGNOSIS — E1165 Type 2 diabetes mellitus with hyperglycemia: Secondary | ICD-10-CM | POA: Diagnosis not present

## 2018-11-02 DIAGNOSIS — I10 Essential (primary) hypertension: Secondary | ICD-10-CM

## 2018-11-02 DIAGNOSIS — N50819 Testicular pain, unspecified: Secondary | ICD-10-CM | POA: Diagnosis not present

## 2018-11-02 DIAGNOSIS — IMO0002 Reserved for concepts with insufficient information to code with codable children: Secondary | ICD-10-CM

## 2018-11-02 LAB — POCT URINALYSIS DIP (MANUAL ENTRY)
Blood, UA: NEGATIVE
Glucose, UA: 500 mg/dL — AB
Leukocytes, UA: NEGATIVE
Nitrite, UA: NEGATIVE
PH UA: 5 (ref 5.0–8.0)
Protein Ur, POC: 30 mg/dL — AB
SPEC GRAV UA: 1.025 (ref 1.010–1.025)
Urobilinogen, UA: 0.2 E.U./dL

## 2018-11-02 LAB — POCT GLYCOSYLATED HEMOGLOBIN (HGB A1C): Hemoglobin A1C: 14 % — AB (ref 4.0–5.6)

## 2018-11-02 MED ORDER — TERBINAFINE HCL 250 MG PO TABS: 250.0000 mg | ORAL_TABLET | Freq: Every day | ORAL | 1 refills | Status: AC

## 2018-11-02 NOTE — Progress Notes (Signed)
Lab Results  Component Value Date   HGBA1C >15.5 (H) 08/03/2018   Danny Vazquez 58 y.o.   Chief Complaint  Patient presents with  . Diabetes  . Pain    testicular pain since December. Went to urgent care on 12/28, no medication given. Says he has a warm feeling in the testicles    HISTORY OF PRESENT ILLNESS: This is a 58 y.o. male with poorly controlled diabetes complaining of a feeling of "warmness" to scrotal area since last December.  Was seen at urgent care center without any findings and advised to follow-up with urologist.  Patient urinating fine.  Has no other complaints. Has not followed up with endocrinologist yet.  Home glucose has been elevated in the 200 range.  Compliant with medications. Also complaining of fungal infection to toes. No other complaints or medical concerns today.  HPI   Prior to Admission medications   Medication Sig Start Date End Date Taking? Authorizing Provider  blood glucose meter kit and supplies KIT Use up to four times daily as directed. Dispense based on pt & ins preference. Dx: E11.65, Z79.4 07/25/18  Yes Horald Pollen, MD  glucose blood [facility] VERIO) test strip Use 2x a day 06/02/15  Yes Daub, Loura Back, MD  Insulin Glargine (LANTUS SOLOSTAR) 100 UNIT/ML Solostar Pen INJECT 25 UNITS UNDER THE SKIN AT BEDTIME. INCREASE BY 2 UNITS EVERY 2 DAYS UNTIL READINGS REMAIN UNDER 200. 07/07/18  Yes Yulonda Wheeling, Ines Bloomer, MD  Insulin Pen Needle (CAREFINE PEN NEEDLES) 32G X 4 MM MISC Use once a day 07/07/18  Yes Manu Rubey, Ines Bloomer, MD  metFORMIN (GLUCOPHAGE) 1000 MG tablet TAKE 1 TABLET BY MOUTH 2 TIMES DAILY WITH A MEAL. 07/07/18  Yes Tees Toh, Ines Bloomer, MD  ONE TOUCH LANCETS MISC Use 2x a day 07/07/18  Yes Shelbee Apgar, Ines Bloomer, MD  rosuvastatin (CRESTOR) 10 MG tablet Take 1 tablet (10 mg total) by mouth daily. 07/07/18  Yes Le Ferraz, Ines Bloomer, MD  amLODipine (NORVASC) 5 MG tablet Take 1 tablet (5 mg total) by mouth daily. 07/07/18  10/05/18  Horald Pollen, MD  atovaquone-proguanil (MALARONE) 250-100 MG TABS Take 1 tablet by mouth daily. Patient not taking: Reported on 11/02/2018 05/16/14   Roselee Culver, MD  hydrochlorothiazide (HYDRODIURIL) 12.5 MG tablet Take 1 tablet (12.5 mg total) by mouth daily. 07/07/18 10/05/18  Horald Pollen, MD  losartan (COZAAR) 100 MG tablet Take 1 tablet (100 mg total) by mouth daily. 07/07/18 10/05/18  Horald Pollen, MD  sitaGLIPtin (JANUVIA) 100 MG tablet Take 1 tablet (100 mg total) by mouth daily. 07/07/18 10/05/18  Horald Pollen, MD    Allergies  Allergen Reactions  . Dulaglutide Diarrhea  . Food     pork  . Penicillins     REACTION: swelling and itching    Patient Active Problem List   Diagnosis Date Noted  . Abnormal EKG 10/04/2012  . COLONIC POLYPS, ADENOMATOUS, BENIGN 04/21/2010  . VENTRICULAR HYPERTROPHY, LEFT 03/26/2010  . SLEEP APNEA 02/12/2010  . GOUT, UNSPECIFIED 12/12/2009  . Insulin dependent type 2 diabetes mellitus, uncontrolled ([place]) 11/12/2009  . SNORING 10/28/2009  . DEPRESSION 06/13/2008  . OTHER ABNORMAL GLUCOSE 10/15/2007  . PTERYGIUM, BILATERAL 05/08/2007  . ERECTILE DYSFUNCTION, SECONDARY TO MEDICATION 05/08/2007  . Hyperlipidemia 04/21/2007  . HYPERTENSION 04/21/2007    Past Medical History:  Diagnosis Date  . Allergy   . Diabetes mellitus without complication ([place])    Diet controlled  . HTN (hypertension)  Past Surgical History:  Procedure Laterality Date  . None      Social History   Socioeconomic History  . Marital status: Married    Spouse name: Not on file  . Number of children: 4  . Years of education: Not on file  . Highest education level: Not on file  Occupational History    Employer: [employer]  Social Needs  . Financial resource strain: Not on file  . Food insecurity:    Worry: Not on file    Inability: Not on file  . Transportation needs:    Medical: Not on file     Non-medical: Not on file  Tobacco Use  . Smoking status: Never Smoker  . Smokeless tobacco: Never Used  Substance and Sexual Activity  . Alcohol use: Yes    Comment: occasionally  . Drug use: Never  . Sexual activity: Not on file  Lifestyle  . Physical activity:    Days per week: Not on file    Minutes per session: Not on file  . Stress: Not on file  Relationships  . Social connections:    Talks on phone: Not on file    Gets together: Not on file    Attends religious service: Not on file    Active member of club or organization: Not on file    Attends meetings of clubs or organizations: Not on file    Relationship status: Not on file  . Intimate partner violence:    Fear of current or ex partner: Not on file    Emotionally abused: Not on file    Physically abused: Not on file    Forced sexual activity: Not on file  Other Topics Concern  . Not on file  Social History Narrative   Lives with wife.m      Family History  Problem Relation Age of Onset  . Diabetes Mother   . Hypertension Mother   . Heart disease Father        CHF     Review of Systems  Constitutional: Negative.  Negative for chills, fever and weight loss.  HENT: Negative.   Eyes: Negative.   Respiratory: Negative.  Negative for cough and shortness of breath.   Cardiovascular: Negative.  Negative for chest pain and palpitations.  Gastrointestinal: Negative.  Negative for abdominal pain, diarrhea, nausea and vomiting.  Genitourinary: Negative for dysuria, flank pain, frequency, hematuria and urgency.  Musculoskeletal: Negative.   Skin: Negative.  Negative for rash.  Neurological: Negative.  Negative for dizziness and headaches.  All other systems reviewed and are negative.  Vitals:   11/02/18 0847  BP: (!) 170/116  Pulse: 97  Temp: 98.5 F (36.9 C)  SpO2: 99%   Repeat blood pressure 160/90  Physical Exam Vitals signs reviewed.  Constitutional:      Appearance: Normal appearance.  HENT:      Head: Normocephalic and atraumatic.     Nose: Nose normal.     Mouth/Throat:     Mouth: Mucous membranes are moist.     Pharynx: Oropharynx is clear.  Eyes:     Extraocular Movements: Extraocular movements intact.     Conjunctiva/sclera: Conjunctivae normal.     Pupils: Pupils are equal, round, and reactive to light.  Neck:     Musculoskeletal: Normal range of motion and neck supple.  Cardiovascular:     Rate and Rhythm: Normal rate and regular rhythm.     Pulses: Normal pulses.  Dorsalis pedis pulses are 2+ on the right side.       Posterior tibial pulses are 2+ on the right side.     Heart sounds: Normal heart sounds.  Pulmonary:     Effort: Pulmonary effort is normal.     Breath sounds: Normal breath sounds.  Abdominal:     Palpations: Abdomen is soft.     Tenderness: There is no abdominal tenderness. There is no guarding.     Hernia: There is no hernia in the right inguinal area or left inguinal area.  Genitourinary:    Penis: Normal and circumcised. No erythema, tenderness, discharge, swelling or lesions.      Scrotum/Testes: Normal.        Right: Mass, tenderness or swelling not present.        Left: Mass, tenderness or swelling not present.     Epididymis:     Right: Normal.     Left: Normal.  Musculoskeletal: Normal range of motion.     Right foot: Normal range of motion.  Feet:     Right foot:     Skin integrity: Skin integrity normal.     Toenail Condition: Fungal disease present. Lymphadenopathy:     Lower Body: No right inguinal adenopathy. No left inguinal adenopathy.  Skin:    General: Skin is warm and dry.     Capillary Refill: Capillary refill takes less than 2 seconds.  Neurological:     General: No focal deficit present.     Mental Status: He is alert and oriented to person, place, and time.  Psychiatric:        Mood and Affect: Mood normal.        Behavior: Behavior normal.    Results for orders placed or performed in visit on 11/02/18  (from the past 24 hour(s))  POCT urinalysis dipstick     Status: Abnormal   Collection Time: 11/02/18  9:03 AM  Result Value Ref Range   Color, UA yellow yellow   Clarity, UA clear clear   Glucose, UA =500 (A) negative mg/dL   Bilirubin, UA small (A) negative   Ketones, POC UA trace (5) (A) negative mg/dL   Spec Grav, UA 1.025 1.010 - 1.025   Blood, UA negative negative   pH, UA 5.0 5.0 - 8.0   Protein Ur, POC =30 (A) negative mg/dL   Urobilinogen, UA 0.2 0.2 or 1.0 E.U./dL   Nitrite, UA Negative Negative   Leukocytes, UA Negative Negative  POCT glycosylated hemoglobin (Hb A1C)     Status: Abnormal   Collection Time: 11/02/18  9:07 AM  Result Value Ref Range   Hemoglobin A1C 14.0 (A) 4.0 - 5.6 %   HbA1c POC (<> result, manual entry)     HbA1c, POC (prediabetic range)     HbA1c, POC (controlled diabetic range)       ASSESSMENT & PLAN: Insulin dependent type 2 diabetes mellitus, uncontrolled Uncontrolled diabetes with elevated hemoglobin A1c.  Advised to increase bedtime insulin to 30 units and continue other medications. Will see endocrinologist next week.  Follow-up in 3 months.  Essential hypertension Elevated blood pressure today.  Patient noncompliant with medications.  Will reassess in 3 months.  Danny Vazquez was seen today for diabetes and pain.  Diagnoses and all orders for this visit:  Type 2 diabetes mellitus with hyperglycemia, with long-term current use of insulin (HCC) -     POCT glycosylated hemoglobin (Hb A1C) -     Lipid panel -  CMP14+EGFR -     Microalbumin / creatinine urine ratio -     Ambulatory referral to Ophthalmology  Hyperlipidemia, unspecified hyperlipidemia type -     Lipid panel -     CMP14+EGFR  Essential hypertension -     Lipid panel -     CMP14+EGFR  Testicular pain -     Ambulatory referral to Urology -     POCT urinalysis dipstick  Onychomycosis -     terbinafine (LAMISIL) 250 MG tablet; Take 1 tablet (250 mg total) by mouth daily  for 14 days.  Insulin dependent type 2 diabetes mellitus, uncontrolled ([place])    Patient Instructions    Increase Lantus insulin to 30 units at bedtime and increase as directed.  Follow-up with endocrinologist as recommended.   If you have lab work done today you will be contacted with your lab results within the next 2 weeks.  If you have not heard from [place] then please contact us. The fastest way to get your results is to register for My Chart.   IF you received an x-ray today, you will receive an invoice from [facility]. Please contact [facility] at [phone] with questions or concerns regarding your invoice.   IF you received labwork today, you will receive an invoice from [facility]. Please contact [facility] at [phone] with questions or concerns regarding your invoice.   Our billing staff will not be able to assist you with questions regarding bills from these companies.  You will be contacted with the lab results as soon as they are available. The fastest way to get your results is to activate your My Chart account. Instructions are located on the last page of this paperwork. If you have not heard from [place] regarding the results in 2 weeks, please contact this office.     Diabetes Mellitus and Nutrition, Adult When you have diabetes (diabetes mellitus), it is very important to have healthy eating habits because your blood sugar (glucose) levels are greatly affected by what you eat and drink. Eating healthy foods in the appropriate amounts, at about the same times every day, can help you:  Control your blood glucose.  Lower your risk of heart disease.  Improve your blood pressure.  Reach or maintain a healthy weight. Every person with diabetes is different, and each person has different needs for a meal plan. Your health care provider may recommend that you work with a diet and nutrition specialist (dietitian) to make a meal plan that is best for you. Your  meal plan may vary depending on factors such as:  The calories you need.  The medicines you take.  Your weight.  Your blood glucose, blood pressure, and cholesterol levels.  Your activity level.  Other health conditions you have, such as heart or kidney disease. How do carbohydrates affect me? Carbohydrates, also called carbs, affect your blood glucose level more than any other type of food. Eating carbs naturally raises the amount of glucose in your blood. Carb counting is a method for keeping track of how many carbs you eat. Counting carbs is important to keep your blood glucose at a healthy level, especially if you use insulin or take certain oral diabetes medicines. It is important to know how many carbs you can safely have in each meal. This is different for every person. Your dietitian can help you calculate how many carbs you should have at each meal and for each snack. Foods that contain carbs include:  Bread, cereal,  rice, pasta, and crackers.  Potatoes and corn.  Peas, beans, and lentils.  Milk and yogurt.  Fruit and juice.  Desserts, such as cakes, cookies, ice cream, and candy. How does alcohol affect me? Alcohol can cause a sudden decrease in blood glucose (hypoglycemia), especially if you use insulin or take certain oral diabetes medicines. Hypoglycemia can be a life-threatening condition. Symptoms of hypoglycemia (sleepiness, dizziness, and confusion) are similar to symptoms of having too much alcohol. If your health care provider says that alcohol is safe for you, follow these guidelines:  Limit alcohol intake to no more than 1 drink per day for nonpregnant women and 2 drinks per day for men. One drink equals 12 oz of beer, 5 oz of wine, or 1 oz of hard liquor.  Do not drink on an empty stomach.  Keep yourself hydrated with water, diet soda, or unsweetened iced tea.  Keep in mind that regular soda, juice, and other mixers may contain a lot of sugar and must be  counted as carbs. What are tips for following this plan?  Reading food labels  Start by checking the serving size on the "Nutrition Facts" label of packaged foods and drinks. The amount of calories, carbs, fats, and other nutrients listed on the label is based on one serving of the item. Many items contain more than one serving per package.  Check the total grams (g) of carbs in one serving. You can calculate the number of servings of carbs in one serving by dividing the total carbs by 15. For example, if a food has 30 g of total carbs, it would be equal to 2 servings of carbs.  Check the number of grams (g) of saturated and trans fats in one serving. Choose foods that have low or no amount of these fats.  Check the number of milligrams (mg) of salt (sodium) in one serving. Most people should limit total sodium intake to less than 2,300 mg per day.  Always check the nutrition information of foods labeled as "low-fat" or "nonfat". These foods may be higher in added sugar or refined carbs and should be avoided.  Talk to your dietitian to identify your daily goals for nutrients listed on the label. Shopping  Avoid buying canned, premade, or processed foods. These foods tend to be high in fat, sodium, and added sugar.  Shop around the outside edge of the grocery store. This includes fresh fruits and vegetables, bulk grains, fresh meats, and fresh dairy. Cooking  Use low-heat cooking methods, such as baking, instead of high-heat cooking methods like deep frying.  Cook using healthy oils, such as olive, canola, or sunflower oil.  Avoid cooking with butter, cream, or high-fat meats. Meal planning  Eat meals and snacks regularly, preferably at the same times every day. Avoid going long periods of time without eating.  Eat foods high in fiber, such as fresh fruits, vegetables, beans, and whole grains. Talk to your dietitian about how many servings of carbs you can eat at each meal.  Eat 4-6  ounces (oz) of lean protein each day, such as lean meat, chicken, fish, eggs, or tofu. One oz of lean protein is equal to: ? 1 oz of meat, chicken, or fish. ? 1 egg. ?  cup of tofu.  Eat some foods each day that contain healthy fats, such as avocado, nuts, seeds, and fish. Lifestyle  Check your blood glucose regularly.  Exercise regularly as told by your health care provider. This may include: ?  150 minutes of moderate-intensity or vigorous-intensity exercise each week. This could be brisk walking, biking, or water aerobics. ? Stretching and doing strength exercises, such as yoga or weightlifting, at least 2 times a week.  Take medicines as told by your health care provider.  Do not use any products that contain nicotine or tobacco, such as cigarettes and e-cigarettes. If you need help quitting, ask your health care provider.  Work with a Social worker or diabetes educator to identify strategies to manage stress and any emotional and social challenges. Questions to ask a health care provider  Do I need to meet with a diabetes educator?  Do I need to meet with a dietitian?  What number can I call if I have questions?  When are the best times to check my blood glucose? Where to find more information:  American Diabetes Association: diabetes.org  Academy of Nutrition and Dietetics: www.eatright.[employer] of Diabetes and Digestive and Kidney Diseases (NIH): [employer] Summary  A healthy meal plan will help you control your blood glucose and maintain a healthy lifestyle.  Working with a diet and nutrition specialist (dietitian) can help you make a meal plan that is best for you.  Keep in mind that carbohydrates (carbs) and alcohol have immediate effects on your blood glucose levels. It is important to count carbs and to use alcohol carefully. This information is not intended to replace advice given to you by your health care provider. Make sure you discuss any  questions you have with your health care provider. Document Released: 05/07/2005 Document Revised: 03/10/2017 Document Reviewed: 09/14/2016 Elsevier Interactive Patient Education  2019 [employer] Inc.      Agustina Caroli, MD [facility]

## 2018-11-02 NOTE — Patient Instructions (Addendum)
Increase Lantus insulin to 30 units at bedtime and increase as directed.  Follow-up with endocrinologist as recommended.   If you have lab work done today you will be contacted with your lab results within the next 2 weeks.  If you have not heard from [place] then please contact us. The fastest way to get your results is to register for My Chart.   IF you received an x-ray today, you will receive an invoice from [facility]. Please contact [facility] at [phone] with questions or concerns regarding your invoice.   IF you received labwork today, you will receive an invoice from [place]. Please contact [facility] at [phone] with questions or concerns regarding your invoice.   Our billing staff will not be able to assist you with questions regarding bills from these companies.  You will be contacted with the lab results as soon as they are available. The fastest way to get your results is to activate your My Chart account. Instructions are located on the last page of this paperwork. If you have not heard from [place] regarding the results in 2 weeks, please contact this office.     Diabetes Mellitus and Nutrition, Adult When you have diabetes (diabetes mellitus), it is very important to have healthy eating habits because your blood sugar (glucose) levels are greatly affected by what you eat and drink. Eating healthy foods in the appropriate amounts, at about the same times every day, can help you:  Control your blood glucose.  Lower your risk of heart disease.  Improve your blood pressure.  Reach or maintain a healthy weight. Every person with diabetes is different, and each person has different needs for a meal plan. Your health care provider may recommend that you work with a diet and nutrition specialist (dietitian) to make a meal plan that is best for you. Your meal plan may vary depending on factors such as:  The calories you need.  The medicines you take.  Your  weight.  Your blood glucose, blood pressure, and cholesterol levels.  Your activity level.  Other health conditions you have, such as heart or kidney disease. How do carbohydrates affect me? Carbohydrates, also called carbs, affect your blood glucose level more than any other type of food. Eating carbs naturally raises the amount of glucose in your blood. Carb counting is a method for keeping track of how many carbs you eat. Counting carbs is important to keep your blood glucose at a healthy level, especially if you use insulin or take certain oral diabetes medicines. It is important to know how many carbs you can safely have in each meal. This is different for every person. Your dietitian can help you calculate how many carbs you should have at each meal and for each snack. Foods that contain carbs include:  Bread, cereal, rice, pasta, and crackers.  Potatoes and corn.  Peas, beans, and lentils.  Milk and yogurt.  Fruit and juice.  Desserts, such as cakes, cookies, ice cream, and candy. How does alcohol affect me? Alcohol can cause a sudden decrease in blood glucose (hypoglycemia), especially if you use insulin or take certain oral diabetes medicines. Hypoglycemia can be a life-threatening condition. Symptoms of hypoglycemia (sleepiness, dizziness, and confusion) are similar to symptoms of having too much alcohol. If your health care provider says that alcohol is safe for you, follow these guidelines:  Limit alcohol intake to no more than 1 drink per day for nonpregnant women and 2 drinks per day for men.  One drink equals 12 oz of beer, 5 oz of wine, or 1 oz of hard liquor.  Do not drink on an empty stomach.  Keep yourself hydrated with water, diet soda, or unsweetened iced tea.  Keep in mind that regular soda, juice, and other mixers may contain a lot of sugar and must be counted as carbs. What are tips for following this plan?  Reading food labels  Start by checking the  serving size on the "Nutrition Facts" label of packaged foods and drinks. The amount of calories, carbs, fats, and other nutrients listed on the label is based on one serving of the item. Many items contain more than one serving per package.  Check the total grams (g) of carbs in one serving. You can calculate the number of servings of carbs in one serving by dividing the total carbs by 15. For example, if a food has 30 g of total carbs, it would be equal to 2 servings of carbs.  Check the number of grams (g) of saturated and trans fats in one serving. Choose foods that have low or no amount of these fats.  Check the number of milligrams (mg) of salt (sodium) in one serving. Most people should limit total sodium intake to less than 2,300 mg per day.  Always check the nutrition information of foods labeled as "low-fat" or "nonfat". These foods may be higher in added sugar or refined carbs and should be avoided.  Talk to your dietitian to identify your daily goals for nutrients listed on the label. Shopping  Avoid buying canned, premade, or processed foods. These foods tend to be high in fat, sodium, and added sugar.  Shop around the outside edge of the grocery store. This includes fresh fruits and vegetables, bulk grains, fresh meats, and fresh dairy. Cooking  Use low-heat cooking methods, such as baking, instead of high-heat cooking methods like deep frying.  Cook using healthy oils, such as olive, canola, or sunflower oil.  Avoid cooking with butter, cream, or high-fat meats. Meal planning  Eat meals and snacks regularly, preferably at the same times every day. Avoid going long periods of time without eating.  Eat foods high in fiber, such as fresh fruits, vegetables, beans, and whole grains. Talk to your dietitian about how many servings of carbs you can eat at each meal.  Eat 4-6 ounces (oz) of lean protein each day, such as lean meat, chicken, fish, eggs, or tofu. One oz of lean  protein is equal to: ? 1 oz of meat, chicken, or fish. ? 1 egg. ?  cup of tofu.  Eat some foods each day that contain healthy fats, such as avocado, nuts, seeds, and fish. Lifestyle  Check your blood glucose regularly.  Exercise regularly as told by your health care provider. This may include: ? 150 minutes of moderate-intensity or vigorous-intensity exercise each week. This could be brisk walking, biking, or water aerobics. ? Stretching and doing strength exercises, such as yoga or weightlifting, at least 2 times a week.  Take medicines as told by your health care provider.  Do not use any products that contain nicotine or tobacco, such as cigarettes and e-cigarettes. If you need help quitting, ask your health care provider.  Work with a Social worker or diabetes educator to identify strategies to manage stress and any emotional and social challenges. Questions to ask a health care provider  Do I need to meet with a diabetes educator?  Do I need to meet with a  dietitian?  What number can I call if I have questions?  When are the best times to check my blood glucose? Where to find more information:  American Diabetes Association: diabetes.org  Academy of Nutrition and Dietetics: www.eatright.[employer] of Diabetes and Digestive and Kidney Diseases (NIH): [employer] Summary  A healthy meal plan will help you control your blood glucose and maintain a healthy lifestyle.  Working with a diet and nutrition specialist (dietitian) can help you make a meal plan that is best for you.  Keep in mind that carbohydrates (carbs) and alcohol have immediate effects on your blood glucose levels. It is important to count carbs and to use alcohol carefully. This information is not intended to replace advice given to you by your health care provider. Make sure you discuss any questions you have with your health care provider. Document Released: 05/07/2005 Document Revised:  03/10/2017 Document Reviewed: 09/14/2016 Elsevier Interactive Patient Education  2019 [employer].

## 2018-11-02 NOTE — Assessment & Plan Note (Signed)
Uncontrolled diabetes with elevated hemoglobin A1c.  Advised to increase bedtime insulin to 30 units and continue other medications. Will see endocrinologist next week.  Follow-up in 3 months.

## 2018-11-02 NOTE — Assessment & Plan Note (Signed)
Elevated blood pressure today.  Patient noncompliant with medications.  Will reassess in 3 months.

## 2018-11-03 LAB — CMP14+EGFR
ALT: 10 IU/L (ref 0–44)
AST: 14 IU/L (ref 0–40)
Albumin/Globulin Ratio: 1.5 (ref 1.2–2.2)
Albumin: 4.5 g/dL (ref 3.8–4.9)
Alkaline Phosphatase: 77 IU/L (ref 39–117)
BUN/Creatinine Ratio: 14 (ref 9–20)
BUN: 13 mg/dL (ref 6–24)
Bilirubin Total: 0.6 mg/dL (ref 0.0–1.2)
CO2: 19 mmol/L — ABNORMAL LOW (ref 20–29)
Calcium: 9.5 mg/dL (ref 8.7–10.2)
Chloride: 101 mmol/L (ref 96–106)
Creatinine, Ser: 0.96 mg/dL (ref 0.76–1.27)
GFR calc Af Amer: 100 mL/min/{1.73_m2} (ref >59)
GFR calc non Af Amer: 87 mL/min/{1.73_m2} (ref >59)
GLOBULIN, TOTAL: 3 g/dL (ref 1.5–4.5)
Glucose: 257 mg/dL — ABNORMAL HIGH (ref 65–99)
Potassium: 4.1 mmol/L (ref 3.5–5.2)
SODIUM: 138 mmol/L (ref 134–144)
Total Protein: 7.5 g/dL (ref 6.0–8.5)

## 2018-11-03 LAB — LIPID PANEL
Chol/HDL Ratio: 3.3 ratio (ref 0.0–5.0)
Cholesterol, Total: 137 mg/dL (ref 100–199)
HDL: 42 mg/dL (ref >39)
LDL Calculated: 74 mg/dL (ref 0–99)
Triglycerides: 105 mg/dL (ref 0–149)
VLDL Cholesterol Cal: 21 mg/dL (ref 5–40)

## 2018-11-03 LAB — MICROALBUMIN / CREATININE URINE RATIO
Creatinine, Urine: 215.6 mg/dL
MICROALBUM., U, RANDOM: 43.5 ug/mL
Microalb/Creat Ratio: 20 mg/g creat (ref 0–29)

## 2018-11-18 DIAGNOSIS — H2513 Age-related nuclear cataract, bilateral: Secondary | ICD-10-CM | POA: Diagnosis not present

## 2018-11-18 DIAGNOSIS — H35033 Hypertensive retinopathy, bilateral: Secondary | ICD-10-CM | POA: Diagnosis not present

## 2018-11-18 DIAGNOSIS — E119 Type 2 diabetes mellitus without complications: Secondary | ICD-10-CM | POA: Diagnosis not present

## 2018-11-18 DIAGNOSIS — H40013 Open angle with borderline findings, low risk, bilateral: Secondary | ICD-10-CM | POA: Diagnosis not present

## 2018-11-18 LAB — HM DIABETES EYE EXAM

## 2018-11-21 ENCOUNTER — Encounter: Payer: Self-pay | Admitting: *Deleted

## 2019-05-29 ENCOUNTER — Other Ambulatory Visit: Payer: Self-pay

## 2019-05-29 ENCOUNTER — Ambulatory Visit (INDEPENDENT_AMBULATORY_CARE_PROVIDER_SITE_OTHER): Payer: 59 | Admitting: Emergency Medicine

## 2019-05-29 DIAGNOSIS — Z23 Encounter for immunization: Secondary | ICD-10-CM | POA: Diagnosis not present

## 2019-05-29 NOTE — Progress Notes (Signed)
  Tuberculosis Risk Questionnaire  1. Yes , [place].  Were you born outside the [place] in one of the following parts of the world: [place], [place], [place], [place] or [place]?    2. No,  Have you traveled outside the [place] and lived for more than one month in one of the following parts of the world: [place], [place], [place], [place] or [place]?    3. Yes, diabetes Do you have a compromised immune system such as from any of the following conditions:HIV/AIDS, organ or bone marrow transplantation, diabetes, immunosuppressive medicines (e.g. Prednisone, Remicaide), leukemia, lymphoma, cancer of the head or neck, gastrectomy or jejunal bypass, end-stage renal disease (on dialysis), or silicosis?     4. No Have you ever or do you plan on working in: a residential care center, a health care facility, a jail or prison or homeless shelter?    5. No Have you ever: injected illegal drugs, used crack cocaine, lived in a homeless shelter  or been in jail or prison?     6. No Have you ever been exposed to anyone with infectious tuberculosis?  7. Yes, As Child.[phone])  Have you ever had a BCG vaccine? (BCG is a vaccine for tuberculosis  (TB) used in OTHER countries, NOT in the [place]).  8. Yes, Have you ever been advised by a health care provider NOT to have a TB skin test?  9. Yes, Have you ever had a POSITIVE TB skin test?  IF SO, when? 2002  IF SO, were you treated with INH? No, Treated with Rifampin  IF SO, where? , [place]   Tuberculosis Symptom Questionnaire  Do you currently have any of the following symptoms?  1. No Unexplained cough lasting more than 3 weeks?   2. No Unexplained fever lasting more than 3 weeks.   3. No Night Sweats (sweating that leaves the bedclothes and sheets wet)     4. No Shortness of Breath   5. No Chest Pain   6. No Unintentional weight loss    7. No Unexplained fatigue (very tired for no reason)

## 2019-12-12 ENCOUNTER — Other Ambulatory Visit: Payer: Self-pay

## 2019-12-12 ENCOUNTER — Ambulatory Visit: Payer: 59

## 2019-12-12 DIAGNOSIS — E1165 Type 2 diabetes mellitus with hyperglycemia: Secondary | ICD-10-CM

## 2019-12-12 DIAGNOSIS — I10 Essential (primary) hypertension: Secondary | ICD-10-CM | POA: Diagnosis not present

## 2019-12-12 DIAGNOSIS — E785 Hyperlipidemia, unspecified: Secondary | ICD-10-CM | POA: Diagnosis not present

## 2019-12-12 DIAGNOSIS — Z794 Long term (current) use of insulin: Secondary | ICD-10-CM | POA: Diagnosis not present

## 2019-12-13 ENCOUNTER — Other Ambulatory Visit: Payer: Self-pay | Admitting: Emergency Medicine

## 2019-12-13 ENCOUNTER — Ambulatory Visit (INDEPENDENT_AMBULATORY_CARE_PROVIDER_SITE_OTHER): Payer: 59 | Admitting: Emergency Medicine

## 2019-12-13 ENCOUNTER — Encounter: Payer: Self-pay | Admitting: Emergency Medicine

## 2019-12-13 VITALS — BP 166/102 | HR 112 | Temp 98.6°F | Ht 66.0 in | Wt 151.2 lb

## 2019-12-13 DIAGNOSIS — N529 Male erectile dysfunction, unspecified: Secondary | ICD-10-CM | POA: Diagnosis not present

## 2019-12-13 DIAGNOSIS — E1165 Type 2 diabetes mellitus with hyperglycemia: Secondary | ICD-10-CM | POA: Diagnosis not present

## 2019-12-13 DIAGNOSIS — Z0001 Encounter for general adult medical examination with abnormal findings: Secondary | ICD-10-CM | POA: Diagnosis not present

## 2019-12-13 DIAGNOSIS — E1159 Type 2 diabetes mellitus with other circulatory complications: Secondary | ICD-10-CM | POA: Diagnosis not present

## 2019-12-13 DIAGNOSIS — I10 Essential (primary) hypertension: Secondary | ICD-10-CM | POA: Diagnosis not present

## 2019-12-13 DIAGNOSIS — E1169 Type 2 diabetes mellitus with other specified complication: Secondary | ICD-10-CM

## 2019-12-13 DIAGNOSIS — E785 Hyperlipidemia, unspecified: Secondary | ICD-10-CM

## 2019-12-13 DIAGNOSIS — I152 Hypertension secondary to endocrine disorders: Secondary | ICD-10-CM

## 2019-12-13 DIAGNOSIS — Z794 Long term (current) use of insulin: Secondary | ICD-10-CM | POA: Diagnosis not present

## 2019-12-13 LAB — LIPID PANEL
Chol/HDL Ratio: 4.3 ratio (ref 0.0–5.0)
Cholesterol, Total: 191 mg/dL (ref 100–199)
HDL: 44 mg/dL (ref >39)
LDL Chol Calc (NIH): 127 mg/dL — ABNORMAL HIGH (ref 0–99)
Triglycerides: 108 mg/dL (ref 0–149)
VLDL Cholesterol Cal: 20 mg/dL (ref 5–40)

## 2019-12-13 LAB — HEMOGLOBIN A1C
Est. average glucose Bld gHb Est-mCnc: >398 mg/dL
Hgb A1c MFr Bld: >15.5 % — ABNORMAL HIGH (ref 4.8–5.6)

## 2019-12-13 LAB — CMP14+EGFR
ALT: 11 IU/L (ref 0–44)
AST: 11 IU/L (ref 0–40)
Albumin/Globulin Ratio: 1.6 (ref 1.2–2.2)
Albumin: 4.4 g/dL (ref 3.8–4.9)
Alkaline Phosphatase: 102 IU/L (ref 39–117)
BUN/Creatinine Ratio: 15 (ref 9–20)
BUN: 19 mg/dL (ref 6–24)
Bilirubin Total: 0.7 mg/dL (ref 0.0–1.2)
CO2: 22 mmol/L (ref 20–29)
Calcium: 9.9 mg/dL (ref 8.7–10.2)
Chloride: 97 mmol/L (ref 96–106)
Creatinine, Ser: 1.24 mg/dL (ref 0.76–1.27)
GFR calc Af Amer: 73 mL/min/{1.73_m2} (ref >59)
GFR calc non Af Amer: 63 mL/min/{1.73_m2} (ref >59)
Globulin, Total: 2.8 g/dL (ref 1.5–4.5)
Glucose: 478 mg/dL — ABNORMAL HIGH (ref 65–99)
Potassium: 4.4 mmol/L (ref 3.5–5.2)
Sodium: 138 mmol/L (ref 134–144)
Total Protein: 7.2 g/dL (ref 6.0–8.5)

## 2019-12-13 LAB — MICROALBUMIN / CREATININE URINE RATIO
Creatinine, Urine: 51.6 mg/dL
Microalb/Creat Ratio: 31 mg/g creat — ABNORMAL HIGH (ref 0–29)
Microalbumin, Urine: 15.8 ug/mL

## 2019-12-13 MED ORDER — SILDENAFIL CITRATE 100 MG PO TABS: 50.0000 mg | ORAL_TABLET | Freq: Every day | ORAL | 11 refills | Status: DC | PRN

## 2019-12-13 MED ORDER — FARXIGA 5 MG PO TABS: 5.0000 mg | ORAL_TABLET | Freq: Every day | ORAL | 3 refills | Status: DC

## 2019-12-13 MED ORDER — ROSUVASTATIN CALCIUM 10 MG PO TABS: 10.0000 mg | ORAL_TABLET | Freq: Every day | ORAL | 3 refills | Status: DC

## 2019-12-13 MED ORDER — METFORMIN HCL 1000 MG PO TABS: ORAL_TABLET | ORAL | 3 refills | Status: DC

## 2019-12-13 MED ORDER — OZEMPIC (0.25 OR 0.5 MG/DOSE) 2 MG/1.5ML ~~LOC~~ SOPN: 0.5000 mg | PEN_INJECTOR | SUBCUTANEOUS | 5 refills | Status: DC

## 2019-12-13 MED ORDER — AMLODIPINE BESYLATE 5 MG PO TABS: 5.0000 mg | ORAL_TABLET | Freq: Every day | ORAL | 3 refills | Status: DC

## 2019-12-13 MED ORDER — LOSARTAN POTASSIUM 100 MG PO TABS: 100.0000 mg | ORAL_TABLET | Freq: Every day | ORAL | 3 refills | Status: DC

## 2019-12-13 MED FILL — ROSUVASTATIN CALCIUM 10 MG: 10 | 90 days supply | Qty: 90 | Fill #0

## 2019-12-13 MED FILL — METFORMIN HCL 1000 MG TABS: 1000 | 90 days supply | Qty: 180 | Fill #0

## 2019-12-13 MED FILL — AMLODIPINE BESYLATE 5 MG TA: 5 | 90 days supply | Qty: 90 | Fill #0

## 2019-12-13 MED FILL — SILDENAFIL CITRATE 100 MG T: 100 | 30 days supply | Qty: 5 | Fill #0

## 2019-12-13 MED FILL — LOSARTAN POTASSIUM 100 MG T: 100 | 90 days supply | Qty: 90 | Fill #0

## 2019-12-13 MED FILL — FARXIGA 5 MG TABLET: 5 | 90 days supply | Qty: 90 | Fill #0

## 2019-12-13 NOTE — Progress Notes (Signed)
Danny Vazquez 59 y.o.   Chief Complaint  Patient presents with  . Annual Exam    CPE- he has not taken most of his medication and has been out of them     HISTORY OF PRESENT ILLNESS: This is a 59 y.o. male here for annual exam.  Patient has multiple chronic medical conditions and has been out of medication for several months.  Fully vaccinated against Covid. 1.  Hypertension: Was on amlodipine and losartan.  Elevated blood pressure today in the office.  Asymptomatic. 2.  Diabetes: Still taking Metformin but off all other medications.  Was taking insulin.  Has taken Trulicity in the past but developed diarrhea.  Has taken Januvia in the past but off medication now. 3.  Obstructive sleep apnea on CPAP therapy. 4.  Dyslipidemia: On rosuvastatin 10 mg but out of medication. 5.  Erectile dysfunction: Requesting medication. Up-to-date with colonoscopy.  HPI   Prior to Admission medications   Medication Sig Start Date End Date Taking? Authorizing Provider  amLODipine (NORVASC) 5 MG tablet Take 1 tablet (5 mg total) by mouth daily. 07/07/18 12/13/19 Yes Polly Barner, Ines Bloomer, MD  metFORMIN (GLUCOPHAGE) 1000 MG tablet TAKE 1 TABLET BY MOUTH 2 TIMES DAILY WITH A MEAL. 07/07/18  Yes Deray Dawes, Ines Bloomer, MD  atovaquone-proguanil (MALARONE) 250-100 MG TABS Take 1 tablet by mouth daily. Patient not taking: Reported on 11/02/2018 05/16/14   Roselee Culver, MD  blood glucose meter kit and supplies KIT Use up to four times daily as directed. Dispense based on pt & ins preference. Dx: E11.65, Z79.4 Patient not taking: Reported on 12/13/2019 07/25/18   Horald Pollen, MD  glucose blood [facility] VERIO) test strip Use 2x a day Patient not taking: Reported on 12/13/2019 06/02/15   Darlyne Russian, MD  Insulin Glargine (LANTUS SOLOSTAR) 100 UNIT/ML Solostar Pen INJECT 25 UNITS UNDER THE SKIN AT BEDTIME. INCREASE BY 2 UNITS EVERY 2 DAYS UNTIL READINGS REMAIN UNDER 200. Patient not taking: Reported on  12/13/2019 07/07/18   Horald Pollen, MD  Insulin Pen Needle (CAREFINE PEN NEEDLES) 32G X 4 MM MISC Use once a day Patient not taking: Reported on 12/13/2019 07/07/18   Horald Pollen, MD  losartan (COZAAR) 100 MG tablet Take 1 tablet (100 mg total) by mouth daily. 07/07/18 10/05/18  Horald Pollen, MD  ONE TOUCH LANCETS MISC Use 2x a day Patient not taking: Reported on 12/13/2019 07/07/18   Horald Pollen, MD  rosuvastatin (CRESTOR) 10 MG tablet Take 1 tablet (10 mg total) by mouth daily. Patient not taking: Reported on 12/13/2019 07/07/18   Horald Pollen, MD  sitaGLIPtin (JANUVIA) 100 MG tablet Take 1 tablet (100 mg total) by mouth daily. Patient not taking: Reported on 12/13/2019 07/07/18 10/05/18  Horald Pollen, MD    Allergies  Allergen Reactions  . Dulaglutide Diarrhea  . Food     pork  . Penicillins     REACTION: swelling and itching    Patient Active Problem List   Diagnosis Date Noted  . Abnormal EKG 10/04/2012  . COLONIC POLYPS, ADENOMATOUS, BENIGN 04/21/2010  . VENTRICULAR HYPERTROPHY, LEFT 03/26/2010  . SLEEP APNEA 02/12/2010  . GOUT, UNSPECIFIED 12/12/2009  . Type 2 diabetes mellitus with hyperglycemia, with long-term current use of insulin ([place]) 11/12/2009  . SNORING 10/28/2009  . DEPRESSION 06/13/2008  . PTERYGIUM, BILATERAL 05/08/2007  . ERECTILE DYSFUNCTION, SECONDARY TO MEDICATION 05/08/2007  . Hyperlipidemia 04/21/2007  . Essential hypertension 04/21/2007  Past Medical History:  Diagnosis Date  . Allergy   . Diabetes mellitus without complication ([place])    Diet controlled  . HTN (hypertension)     Past Surgical History:  Procedure Laterality Date  . None      Social History   Socioeconomic History  . Marital status: Married    Spouse name: Not on file  . Number of children: 4  . Years of education: Not on file  . Highest education level: Not on file  Occupational History    Employer: [employer]   Tobacco Use  . Smoking status: Never Smoker  . Smokeless tobacco: Never Used  Substance and Sexual Activity  . Alcohol use: Yes    Comment: occasionally  . Drug use: Never  . Sexual activity: Not on file  Other Topics Concern  . Not on file  Social History Narrative   Lives with wife.m     Social Determinants of Health   Financial Resource Strain:   . Difficulty of Paying Living Expenses:   Food Insecurity:   . Worried About Charity fundraiser in the Last Year:   . Arboriculturist in the Last Year:   Transportation Needs:   . Film/video editor (Medical):   Marland Kitchen Lack of Transportation (Non-Medical):   Physical Activity:   . Days of Exercise per Week:   . Minutes of Exercise per Session:   Stress:   . Feeling of Stress :   Social Connections:   . Frequency of Communication with Friends and Family:   . Frequency of Social Gatherings with Friends and Family:   . Attends Religious Services:   . Active Member of Clubs or Organizations:   . Attends Archivist Meetings:   Marland Kitchen Marital Status:   Intimate Partner Violence:   . Fear of Current or Ex-Partner:   . Emotionally Abused:   Marland Kitchen Physically Abused:   . Sexually Abused:     Family History  Problem Relation Age of Onset  . Diabetes Mother   . Hypertension Mother   . Heart disease Father        CHF     Review of Systems  Constitutional: Negative.  Negative for chills, fever and weight loss.  HENT: Negative.  Negative for congestion and sore throat.   Eyes: Negative.  Negative for blurred vision and double vision.  Respiratory: Negative.  Negative for cough and shortness of breath.   Cardiovascular: Negative.  Negative for chest pain and palpitations.  Gastrointestinal: Negative.  Negative for abdominal pain, blood in stool, diarrhea, nausea and vomiting.  Genitourinary: Negative.  Negative for dysuria and hematuria.       Erectile dysfunction  Musculoskeletal: Negative for back pain, myalgias and neck  pain.  Skin: Negative.  Negative for rash.  Neurological: Negative.  Negative for dizziness and headaches.  All other systems reviewed and are negative.   Today's Vitals   12/13/19 0850 12/13/19 0859  BP: (!) 196/126 (!) 166/102  Pulse: (!) 112   Temp: 98.6 F (37 C)   TempSrc: Temporal   SpO2: 97%   Weight: 151 lb 3.2 oz (68.6 kg)   Height: 5' 6"  (1.676 m)    Body mass index is 24.4 kg/m.  Physical Exam Vitals reviewed.  Constitutional:      Appearance: Normal appearance.  HENT:     Head: Normocephalic.  Eyes:     Extraocular Movements: Extraocular movements intact.     Conjunctiva/sclera: Conjunctivae normal.  Pupils: Pupils are equal, round, and reactive to light.  Cardiovascular:     Rate and Rhythm: Normal rate and regular rhythm.     Pulses: Normal pulses.     Heart sounds: Normal heart sounds.     Comments: Repeat heart rate at bedside 92 and regular Pulmonary:     Effort: Pulmonary effort is normal.     Breath sounds: Normal breath sounds.  Abdominal:     General: Bowel sounds are normal. There is no distension.     Palpations: Abdomen is soft.     Tenderness: There is no abdominal tenderness.  Musculoskeletal:        General: Normal range of motion.     Cervical back: Normal range of motion and neck supple.     Right lower leg: No edema.     Left lower leg: No edema.     Comments: Lower extremities/feet: Warm to touch.  No erythema or skin breakdowns.  Excellent peripheral pulses.  Good distal sensation  Lymphadenopathy:     Cervical: No cervical adenopathy.  Skin:    General: Skin is warm and dry.     Capillary Refill: Capillary refill takes less than 2 seconds.  Neurological:     General: No focal deficit present.     Mental Status: He is alert and oriented to person, place, and time.  Psychiatric:        Mood and Affect: Mood normal.        Behavior: Behavior normal.    Results for orders placed or performed in visit on 12/12/19 (from the  past 24 hour(s))  Microalbumin/Creatinine Ratio, Urine     Status: None (Preliminary result)   Collection Time: 12/12/19 11:43 AM  Result Value Ref Range   Creatinine, Urine WILL FOLLOW    Microalbumin, Urine WILL FOLLOW    Microalb/Creat Ratio WILL FOLLOW    Narrative   Performed at:  [facility] [address] [place], [place]  [id number] Lab Director: Rush Farmer MD, Phone:  [phone]  Lipid panel     Status: Abnormal   Collection Time: 12/12/19 11:43 AM  Result Value Ref Range   Cholesterol, Total 191 100 - 199 mg/dL   Triglycerides 108 0 - 149 mg/dL   HDL 44 >39 mg/dL   VLDL Cholesterol Cal 20 5 - 40 mg/dL   LDL Chol Calc (NIH) 127 (H) 0 - 99 mg/dL   Chol/HDL Ratio 4.3 0.0 - 5.0 ratio   Narrative   Performed at:  [facility] [address] [place], [place]  [id number] Lab Director: Rush Farmer MD, Phone:  [phone]  Hemoglobin A1c     Status: None (Preliminary result)   Collection Time: 12/12/19 11:43 AM  Result Value Ref Range   Hgb A1c MFr Bld WILL FOLLOW    Est. average glucose Bld gHb Est-mCnc WILL FOLLOW    Narrative   Performed at:  [facility] [address] [place], [place]  [id number] Lab Director: Rush Farmer MD, Phone:  [phone]  CMP14+EGFR     Status: Abnormal   Collection Time: 12/12/19 11:43 AM  Result Value Ref Range   Glucose 478 (H) 65 - 99 mg/dL   BUN 19 6 - 24 mg/dL   Creatinine, Ser 1.24 0.76 - 1.27 mg/dL   GFR calc non Af Amer 63 >59 mL/min/1.73   GFR calc Af Amer 73 >59 mL/min/1.73   BUN/Creatinine Ratio 15 9 - 20   Sodium 138 134 -  144 mmol/L   Potassium 4.4 3.5 - 5.2 mmol/L   Chloride 97 96 - 106 mmol/L   CO2 22 20 - 29 mmol/L   Calcium 9.9 8.7 - 10.2 mg/dL   Total Protein 7.2 6.0 - 8.5 g/dL   Albumin 4.4 3.8 - 4.9 g/dL   Globulin, Total 2.8 1.5 - 4.5 g/dL   Albumin/Globulin Ratio 1.6 1.2 - 2.2   Bilirubin Total 0.7 0.0 - 1.2 mg/dL   Alkaline Phosphatase 102 39 - 117 IU/L   AST 11 0 - 40  IU/L   ALT 11 0 - 44 IU/L   Narrative   Performed at:  [address], [place], [place]  [id number] Lab Director: Rush Farmer MD, Phone:  [phone]     ASSESSMENT & PLAN: [place] was seen today for annual exam.  Diagnoses and all orders for this visit:  Encounter for general adult medical examination with abnormal findings  Uncontrolled hypertension -     amLODipine (NORVASC) 5 MG tablet; Take 1 tablet (5 mg total) by mouth daily. -     losartan (COZAAR) 100 MG tablet; Take 1 tablet (100 mg total) by mouth daily.  Uncontrolled type 2 diabetes mellitus with hyperglycemia (HCC) -     metFORMIN (GLUCOPHAGE) 1000 MG tablet; TAKE 1 TABLET BY MOUTH 2 TIMES DAILY WITH A MEAL. -     Semaglutide,0.25 or 0.5MG/DOS, (OZEMPIC, 0.25 OR 0.5 MG/DOSE,) 2 MG/1.5ML SOPN; Inject 0.5 mg into the skin once a week. -     dapagliflozin propanediol (FARXIGA) 5 MG TABS tablet; Take 5 mg by mouth daily before breakfast.  Erectile dysfunction, unspecified erectile dysfunction type -     sildenafil (VIAGRA) 100 MG tablet; Take 0.5-1 tablets (50-100 mg total) by mouth daily as needed for erectile dysfunction.  Dyslipidemia -     rosuvastatin (CRESTOR) 10 MG tablet; Take 1 tablet (10 mg total) by mouth daily.  Hypertension associated with diabetes ([place])  Dyslipidemia associated with type 2 diabetes mellitus ([place])  Essential hypertension -     amLODipine (NORVASC) 5 MG tablet; Take 1 tablet (5 mg total) by mouth daily. -     losartan (COZAAR) 100 MG tablet; Take 1 tablet (100 mg total) by mouth daily.  Hyperlipidemia, unspecified hyperlipidemia type -     rosuvastatin (CRESTOR) 10 MG tablet; Take 1 tablet (10 mg total) by mouth daily.  Type 2 diabetes mellitus with hyperglycemia, with long-term current use of insulin (HCC) -     metFORMIN (GLUCOPHAGE) 1000 MG tablet; TAKE 1 TABLET BY MOUTH 2 TIMES DAILY WITH A MEAL.    Patient Instructions       If you have lab work done  today you will be contacted with your lab results within the next 2 weeks.  If you have not heard from [place] then please contact us. The fastest way to get your results is to register for My Chart.   IF you received an x-ray today, you will receive an invoice from [facility]. Please contact [facility] at [phone] with questions or concerns regarding your invoice.   IF you received labwork today, you will receive an invoice from [facility]. Please contact [facility] at [phone] with questions or concerns regarding your invoice.   Our billing staff will not be able to assist you with questions regarding bills from these companies.  You will be contacted with the lab results as soon as they are available. The fastest way to get your results is to  activate your My Chart account. Instructions are located on the last page of this paperwork. If you have not heard from [place] regarding the results in 2 weeks, please contact this office.      Diabetes Mellitus and Nutrition, Adult When you have diabetes (diabetes mellitus), it is very important to have healthy eating habits because your blood sugar (glucose) levels are greatly affected by what you eat and drink. Eating healthy foods in the appropriate amounts, at about the same times every day, can help you:  Control your blood glucose.  Lower your risk of heart disease.  Improve your blood pressure.  Reach or maintain a healthy weight. Every person with diabetes is different, and each person has different needs for a meal plan. Your health care provider may recommend that you work with a diet and nutrition specialist (dietitian) to make a meal plan that is best for you. Your meal plan may vary depending on factors such as:  The calories you need.  The medicines you take.  Your weight.  Your blood glucose, blood pressure, and cholesterol levels.  Your activity level.  Other health conditions you have, such as heart or  kidney disease. How do carbohydrates affect me? Carbohydrates, also called carbs, affect your blood glucose level more than any other type of food. Eating carbs naturally raises the amount of glucose in your blood. Carb counting is a method for keeping track of how many carbs you eat. Counting carbs is important to keep your blood glucose at a healthy level, especially if you use insulin or take certain oral diabetes medicines. It is important to know how many carbs you can safely have in each meal. This is different for every person. Your dietitian can help you calculate how many carbs you should have at each meal and for each snack. Foods that contain carbs include:  Bread, cereal, rice, pasta, and crackers.  Potatoes and corn.  Peas, beans, and lentils.  Milk and yogurt.  Fruit and juice.  Desserts, such as cakes, cookies, ice cream, and candy. How does alcohol affect me? Alcohol can cause a sudden decrease in blood glucose (hypoglycemia), especially if you use insulin or take certain oral diabetes medicines. Hypoglycemia can be a life-threatening condition. Symptoms of hypoglycemia (sleepiness, dizziness, and confusion) are similar to symptoms of having too much alcohol. If your health care provider says that alcohol is safe for you, follow these guidelines:  Limit alcohol intake to no more than 1 drink per day for nonpregnant women and 2 drinks per day for men. One drink equals 12 oz of beer, 5 oz of wine, or 1 oz of hard liquor.  Do not drink on an empty stomach.  Keep yourself hydrated with water, diet soda, or unsweetened iced tea.  Keep in mind that regular soda, juice, and other mixers may contain a lot of sugar and must be counted as carbs. What are tips for following this plan?  Reading food labels  Start by checking the serving size on the "Nutrition Facts" label of packaged foods and drinks. The amount of calories, carbs, fats, and other nutrients listed on the label is  based on one serving of the item. Many items contain more than one serving per package.  Check the total grams (g) of carbs in one serving. You can calculate the number of servings of carbs in one serving by dividing the total carbs by 15. For example, if a food has 30 g of total carbs, it would  be equal to 2 servings of carbs.  Check the number of grams (g) of saturated and trans fats in one serving. Choose foods that have low or no amount of these fats.  Check the number of milligrams (mg) of salt (sodium) in one serving. Most people should limit total sodium intake to less than 2,300 mg per day.  Always check the nutrition information of foods labeled as "low-fat" or "nonfat". These foods may be higher in added sugar or refined carbs and should be avoided.  Talk to your dietitian to identify your daily goals for nutrients listed on the label. Shopping  Avoid buying canned, premade, or processed foods. These foods tend to be high in fat, sodium, and added sugar.  Shop around the outside edge of the grocery store. This includes fresh fruits and vegetables, bulk grains, fresh meats, and fresh dairy. Cooking  Use low-heat cooking methods, such as baking, instead of high-heat cooking methods like deep frying.  Cook using healthy oils, such as olive, canola, or sunflower oil.  Avoid cooking with butter, cream, or high-fat meats. Meal planning  Eat meals and snacks regularly, preferably at the same times every day. Avoid going long periods of time without eating.  Eat foods high in fiber, such as fresh fruits, vegetables, beans, and whole grains. Talk to your dietitian about how many servings of carbs you can eat at each meal.  Eat 4-6 ounces (oz) of lean protein each day, such as lean meat, chicken, fish, eggs, or tofu. One oz of lean protein is equal to: ? 1 oz of meat, chicken, or fish. ? 1 egg. ?  cup of tofu.  Eat some foods each day that contain healthy fats, such as avocado,  nuts, seeds, and fish. Lifestyle  Check your blood glucose regularly.  Exercise regularly as told by your health care provider. This may include: ? 150 minutes of moderate-intensity or vigorous-intensity exercise each week. This could be brisk walking, biking, or water aerobics. ? Stretching and doing strength exercises, such as yoga or weightlifting, at least 2 times a week.  Take medicines as told by your health care provider.  Do not use any products that contain nicotine or tobacco, such as cigarettes and e-cigarettes. If you need help quitting, ask your health care provider.  Work with a Social worker or diabetes educator to identify strategies to manage stress and any emotional and social challenges. Questions to ask a health care provider  Do I need to meet with a diabetes educator?  Do I need to meet with a dietitian?  What number can I call if I have questions?  When are the best times to check my blood glucose? Where to find more information:  American Diabetes Association: diabetes.org  Academy of Nutrition and Dietetics: www.eatright.[employer] of Diabetes and Digestive and Kidney Diseases (NIH): [employer] Summary  A healthy meal plan will help you control your blood glucose and maintain a healthy lifestyle.  Working with a diet and nutrition specialist (dietitian) can help you make a meal plan that is best for you.  Keep in mind that carbohydrates (carbs) and alcohol have immediate effects on your blood glucose levels. It is important to count carbs and to use alcohol carefully. This information is not intended to replace advice given to you by your health care provider. Make sure you discuss any questions you have with your health care provider. Document Revised: 07/23/2017 Document Reviewed: 09/14/2016 Elsevier Patient Education  [place].  Hypertension,  Adult High blood pressure (hypertension) is when the force of blood pumping  through the arteries is too strong. The arteries are the blood vessels that carry blood from the heart throughout the body. Hypertension forces the heart to work harder to pump blood and may cause arteries to become narrow or stiff. Untreated or uncontrolled hypertension can cause a heart attack, heart failure, a stroke, kidney disease, and other problems. A blood pressure reading consists of a higher number over a lower number. Ideally, your blood pressure should be below 120/80. The first ("top") number is called the systolic pressure. It is a measure of the pressure in your arteries as your heart beats. The second ("bottom") number is called the diastolic pressure. It is a measure of the pressure in your arteries as the heart relaxes. What are the causes? The exact cause of this condition is not known. There are some conditions that result in or are related to high blood pressure. What increases the risk? Some risk factors for high blood pressure are under your control. The following factors may make you more likely to develop this condition:  Smoking.  Having type 2 diabetes mellitus, high cholesterol, or both.  Not getting enough exercise or physical activity.  Being overweight.  Having too much fat, sugar, calories, or salt (sodium) in your diet.  Drinking too much alcohol. Some risk factors for high blood pressure may be difficult or impossible to change. Some of these factors include:  Having chronic kidney disease.  Having a family history of high blood pressure.  Age. Risk increases with age.  Race. You may be at higher risk if you are African American.  Gender. Men are at higher risk than women before age 33. After age 73, women are at higher risk than men.  Having obstructive sleep apnea.  Stress. What are the signs or symptoms? High blood pressure may not cause symptoms. Very high blood pressure (hypertensive crisis) may cause:  Headache.  Anxiety.  Shortness of  breath.  Nosebleed.  Nausea and vomiting.  Vision changes.  Severe chest pain.  Seizures. How is this diagnosed? This condition is diagnosed by measuring your blood pressure while you are seated, with your arm resting on a flat surface, your legs uncrossed, and your feet flat on the floor. The cuff of the blood pressure monitor will be placed directly against the skin of your upper arm at the level of your heart. It should be measured at least twice using the same arm. Certain conditions can cause a difference in blood pressure between your right and left arms. Certain factors can cause blood pressure readings to be lower or higher than normal for a short period of time:  When your blood pressure is higher when you are in a health care provider's office than when you are at home, this is called white coat hypertension. Most people with this condition do not need medicines.  When your blood pressure is higher at home than when you are in a health care provider's office, this is called masked hypertension. Most people with this condition may need medicines to control blood pressure. If you have a high blood pressure reading during one visit or you have normal blood pressure with other risk factors, you may be asked to:  Return on a different day to have your blood pressure checked again.  Monitor your blood pressure at home for 1 week or longer. If you are diagnosed with hypertension, you may have other blood or  imaging tests to help your health care provider understand your overall risk for other conditions. How is this treated? This condition is treated by making healthy lifestyle changes, such as eating healthy foods, exercising more, and reducing your alcohol intake. Your health care provider may prescribe medicine if lifestyle changes are not enough to get your blood pressure under control, and if:  Your systolic blood pressure is above 130.  Your diastolic blood pressure is above 80.  Your personal target blood pressure may vary depending on your medical conditions, your age, and other factors. Follow these instructions at home: Eating and drinking   Eat a diet that is high in fiber and potassium, and low in sodium, added sugar, and fat. An example eating plan is called the DASH (Dietary Approaches to Stop Hypertension) diet. To eat this way: ? Eat plenty of fresh fruits and vegetables. Try to fill one half of your plate at each meal with fruits and vegetables. ? Eat whole grains, such as whole-wheat pasta, brown rice, or whole-grain bread. Fill about one fourth of your plate with whole grains. ? Eat or drink low-fat dairy products, such as skim milk or low-fat yogurt. ? Avoid fatty cuts of meat, processed or cured meats, and poultry with skin. Fill about one fourth of your plate with lean proteins, such as fish, chicken without skin, beans, eggs, or tofu. ? Avoid pre-made and processed foods. These tend to be higher in sodium, added sugar, and fat.  Reduce your daily sodium intake. Most people with hypertension should eat less than 1,500 mg of sodium a day.  Do not drink alcohol if: ? Your health care provider tells you not to drink. ? You are pregnant, may be pregnant, or are planning to become pregnant.  If you drink alcohol: ? Limit how much you use to:  0-1 drink a day for women.  0-2 drinks a day for men. ? Be aware of how much alcohol is in your drink. In the [place] one drink equals one 12 oz bottle of beer (355 mL), one 5 oz glass of wine (148 mL), or one 1 oz glass of hard liquor (44 mL). Lifestyle   Work with your health care provider to maintain a healthy body weight or to lose weight. Ask what an ideal weight is for you.  Get at least 30 minutes of exercise most days of the week. Activities may include walking, swimming, or biking.  Include exercise to strengthen your muscles (resistance exercise), such as Pilates or lifting weights, as part of your  weekly exercise routine. Try to do these types of exercises for 30 minutes at least 3 days a week.  Do not use any products that contain nicotine or tobacco, such as cigarettes, e-cigarettes, and chewing tobacco. If you need help quitting, ask your health care provider.  Monitor your blood pressure at home as told by your health care provider.  Keep all follow-up visits as told by your health care provider. This is important. Medicines  Take over-the-counter and prescription medicines only as told by your health care provider. Follow directions carefully. Blood pressure medicines must be taken as prescribed.  Do not skip doses of blood pressure medicine. Doing this puts you at risk for problems and can make the medicine less effective.  Ask your health care provider about side effects or reactions to medicines that you should watch for. Contact a health care provider if you:  Think you are having a reaction to a medicine you  are taking.  Have headaches that keep coming back (recurring).  Feel dizzy.  Have swelling in your ankles.  Have trouble with your vision. Get help right away if you:  Develop a severe headache or confusion.  Have unusual weakness or numbness.  Feel faint.  Have severe pain in your chest or abdomen.  Vomit repeatedly.  Have trouble breathing. Summary  Hypertension is when the force of blood pumping through your arteries is too strong. If this condition is not controlled, it may put you at risk for serious complications.  Your personal target blood pressure may vary depending on your medical conditions, your age, and other factors. For most people, a normal blood pressure is less than 120/80.  Hypertension is treated with lifestyle changes, medicines, or a combination of both. Lifestyle changes include losing weight, eating a healthy, low-sodium diet, exercising more, and limiting alcohol. This information is not intended to replace advice given to you  by your health care provider. Make sure you discuss any questions you have with your health care provider. Document Revised: 04/20/2018 Document Reviewed: 04/20/2018 Elsevier Patient Education  2020 [employer]vier Inc.      Agustina Caroli, MD [facility]

## 2019-12-13 NOTE — Patient Instructions (Addendum)
   If you have lab work done today you will be contacted with your lab results within the next 2 weeks.  If you have not heard from us then please contact us. The fastest way to get your results is to register for My Chart.   IF you received an x-ray today, you will receive an invoice from [facility]. Please contact [facility] at [phone] with questions or concerns regarding your invoice.   IF you received labwork today, you will receive an invoice from [facility]. Please contact [facility] at [phone] with questions or concerns regarding your invoice.   Our billing staff will not be able to assist you with questions regarding bills from these companies.  You will be contacted with the lab results as soon as they are available. The fastest way to get your results is to activate your My Chart account. Instructions are located on the last page of this paperwork. If you have not heard from us regarding the results in 2 weeks, please contact this office.     Diabetes Mellitus and Nutrition, Adult When you have diabetes (diabetes mellitus), it is very important to have healthy eating habits because your blood sugar (glucose) levels are greatly affected by what you eat and drink. Eating healthy foods in the appropriate amounts, at about the same times every day, can help you:  Control your blood glucose.  Lower your risk of heart disease.  Improve your blood pressure.  Reach or maintain a healthy weight. Every person with diabetes is different, and each person has different needs for a meal plan. Your health care provider may recommend that you work with a diet and nutrition specialist (dietitian) to make a meal plan that is best for you. Your meal plan may vary depending on factors such as:  The calories you need.  The medicines you take.  Your weight.  Your blood glucose, blood pressure, and cholesterol levels.  Your activity level.  Other health conditions  you have, such as heart or kidney disease. How do carbohydrates affect me? Carbohydrates, also called carbs, affect your blood glucose level more than any other type of food. Eating carbs naturally raises the amount of glucose in your blood. Carb counting is a method for keeping track of how many carbs you eat. Counting carbs is important to keep your blood glucose at a healthy level, especially if you use insulin or take certain oral diabetes medicines. It is important to know how many carbs you can safely have in each meal. This is different for every person. Your dietitian can help you calculate how many carbs you should have at each meal and for each snack. Foods that contain carbs include:  Bread, cereal, rice, pasta, and crackers.  Potatoes and corn.  Peas, beans, and lentils.  Milk and yogurt.  Fruit and juice.  Desserts, such as cakes, cookies, ice cream, and candy. How does alcohol affect me? Alcohol can cause a sudden decrease in blood glucose (hypoglycemia), especially if you use insulin or take certain oral diabetes medicines. Hypoglycemia can be a life-threatening condition. Symptoms of hypoglycemia (sleepiness, dizziness, and confusion) are similar to symptoms of having too much alcohol. If your health care provider says that alcohol is safe for you, follow these guidelines:  Limit alcohol intake to no more than 1 drink per day for nonpregnant women and 2 drinks per day for men. One drink equals 12 oz of beer, 5 oz of wine, or 1 oz of hard liquor.    Do not drink on an empty stomach.  Keep yourself hydrated with water, diet soda, or unsweetened iced tea.  Keep in mind that regular soda, juice, and other mixers may contain a lot of sugar and must be counted as carbs. What are tips for following this plan?  Reading food labels  Start by checking the serving size on the "Nutrition Facts" label of packaged foods and drinks. The amount of calories, carbs, fats, and other  nutrients listed on the label is based on one serving of the item. Many items contain more than one serving per package.  Check the total grams (g) of carbs in one serving. You can calculate the number of servings of carbs in one serving by dividing the total carbs by 15. For example, if a food has 30 g of total carbs, it would be equal to 2 servings of carbs.  Check the number of grams (g) of saturated and trans fats in one serving. Choose foods that have low or no amount of these fats.  Check the number of milligrams (mg) of salt (sodium) in one serving. Most people should limit total sodium intake to less than 2,300 mg per day.  Always check the nutrition information of foods labeled as "low-fat" or "nonfat". These foods may be higher in added sugar or refined carbs and should be avoided.  Talk to your dietitian to identify your daily goals for nutrients listed on the label. Shopping  Avoid buying canned, premade, or processed foods. These foods tend to be high in fat, sodium, and added sugar.  Shop around the outside edge of the grocery store. This includes fresh fruits and vegetables, bulk grains, fresh meats, and fresh dairy. Cooking  Use low-heat cooking methods, such as baking, instead of high-heat cooking methods like deep frying.  Cook using healthy oils, such as olive, canola, or sunflower oil.  Avoid cooking with butter, cream, or high-fat meats. Meal planning  Eat meals and snacks regularly, preferably at the same times every day. Avoid going long periods of time without eating.  Eat foods high in fiber, such as fresh fruits, vegetables, beans, and whole grains. Talk to your dietitian about how many servings of carbs you can eat at each meal.  Eat 4-6 ounces (oz) of lean protein each day, such as lean meat, chicken, fish, eggs, or tofu. One oz of lean protein is equal to: ? 1 oz of meat, chicken, or fish. ? 1 egg. ?  cup of tofu.  Eat some foods each day that contain  healthy fats, such as avocado, nuts, seeds, and fish. Lifestyle  Check your blood glucose regularly.  Exercise regularly as told by your health care provider. This may include: ? 150 minutes of moderate-intensity or vigorous-intensity exercise each week. This could be brisk walking, biking, or water aerobics. ? Stretching and doing strength exercises, such as yoga or weightlifting, at least 2 times a week.  Take medicines as told by your health care provider.  Do not use any products that contain nicotine or tobacco, such as cigarettes and e-cigarettes. If you need help quitting, ask your health care provider.  Work with a counselor or diabetes educator to identify strategies to manage stress and any emotional and social challenges. Questions to ask a health care provider  Do I need to meet with a diabetes educator?  Do I need to meet with a dietitian?  What number can I call if I have questions?  When are the best times to   check my blood glucose? Where to find more information:  American Diabetes Association: diabetes.org  Academy of Nutrition and Dietetics: www.eatright.org  National Institute of Diabetes and Digestive and Kidney Diseases (NIH): www.niddk.nih.gov Summary  A healthy meal plan will help you control your blood glucose and maintain a healthy lifestyle.  Working with a diet and nutrition specialist (dietitian) can help you make a meal plan that is best for you.  Keep in mind that carbohydrates (carbs) and alcohol have immediate effects on your blood glucose levels. It is important to count carbs and to use alcohol carefully. This information is not intended to replace advice given to you by your health care provider. Make sure you discuss any questions you have with your health care provider. Document Revised: 07/23/2017 Document Reviewed: 09/14/2016 Elsevier Patient Education  2020 Elsevier Inc.  Hypertension, Adult High blood pressure (hypertension) is when  the force of blood pumping through the arteries is too strong. The arteries are the blood vessels that carry blood from the heart throughout the body. Hypertension forces the heart to work harder to pump blood and may cause arteries to become narrow or stiff. Untreated or uncontrolled hypertension can cause a heart attack, heart failure, a stroke, kidney disease, and other problems. A blood pressure reading consists of a higher number over a lower number. Ideally, your blood pressure should be below 120/80. The first ("top") number is called the systolic pressure. It is a measure of the pressure in your arteries as your heart beats. The second ("bottom") number is called the diastolic pressure. It is a measure of the pressure in your arteries as the heart relaxes. What are the causes? The exact cause of this condition is not known. There are some conditions that result in or are related to high blood pressure. What increases the risk? Some risk factors for high blood pressure are under your control. The following factors may make you more likely to develop this condition:  Smoking.  Having type 2 diabetes mellitus, high cholesterol, or both.  Not getting enough exercise or physical activity.  Being overweight.  Having too much fat, sugar, calories, or salt (sodium) in your diet.  Drinking too much alcohol. Some risk factors for high blood pressure may be difficult or impossible to change. Some of these factors include:  Having chronic kidney disease.  Having a family history of high blood pressure.  Age. Risk increases with age.  Race. You may be at higher risk if you are African American.  Gender. Men are at higher risk than women before age 45. After age 65, women are at higher risk than men.  Having obstructive sleep apnea.  Stress. What are the signs or symptoms? High blood pressure may not cause symptoms. Very high blood pressure (hypertensive crisis) may  cause:  Headache.  Anxiety.  Shortness of breath.  Nosebleed.  Nausea and vomiting.  Vision changes.  Severe chest pain.  Seizures. How is this diagnosed? This condition is diagnosed by measuring your blood pressure while you are seated, with your arm resting on a flat surface, your legs uncrossed, and your feet flat on the floor. The cuff of the blood pressure monitor will be placed directly against the skin of your upper arm at the level of your heart. It should be measured at least twice using the same arm. Certain conditions can cause a difference in blood pressure between your right and left arms. Certain factors can cause blood pressure readings to be lower or higher   than normal for a short period of time:  When your blood pressure is higher when you are in a health care provider's office than when you are at home, this is called white coat hypertension. Most people with this condition do not need medicines.  When your blood pressure is higher at home than when you are in a health care provider's office, this is called masked hypertension. Most people with this condition may need medicines to control blood pressure. If you have a high blood pressure reading during one visit or you have normal blood pressure with other risk factors, you may be asked to:  Return on a different day to have your blood pressure checked again.  Monitor your blood pressure at home for 1 week or longer. If you are diagnosed with hypertension, you may have other blood or imaging tests to help your health care provider understand your overall risk for other conditions. How is this treated? This condition is treated by making healthy lifestyle changes, such as eating healthy foods, exercising more, and reducing your alcohol intake. Your health care provider may prescribe medicine if lifestyle changes are not enough to get your blood pressure under control, and if:  Your systolic blood pressure is above  130.  Your diastolic blood pressure is above 80. Your personal target blood pressure may vary depending on your medical conditions, your age, and other factors. Follow these instructions at home: Eating and drinking   Eat a diet that is high in fiber and potassium, and low in sodium, added sugar, and fat. An example eating plan is called the DASH (Dietary Approaches to Stop Hypertension) diet. To eat this way: ? Eat plenty of fresh fruits and vegetables. Try to fill one half of your plate at each meal with fruits and vegetables. ? Eat whole grains, such as whole-wheat pasta, brown rice, or whole-grain bread. Fill about one fourth of your plate with whole grains. ? Eat or drink low-fat dairy products, such as skim milk or low-fat yogurt. ? Avoid fatty cuts of meat, processed or cured meats, and poultry with skin. Fill about one fourth of your plate with lean proteins, such as fish, chicken without skin, beans, eggs, or tofu. ? Avoid pre-made and processed foods. These tend to be higher in sodium, added sugar, and fat.  Reduce your daily sodium intake. Most people with hypertension should eat less than 1,500 mg of sodium a day.  Do not drink alcohol if: ? Your health care provider tells you not to drink. ? You are pregnant, may be pregnant, or are planning to become pregnant.  If you drink alcohol: ? Limit how much you use to:  0-1 drink a day for women.  0-2 drinks a day for men. ? Be aware of how much alcohol is in your drink. In the [place] one drink equals one 12 oz bottle of beer (355 mL), one 5 oz glass of wine (148 mL), or one 1 oz glass of hard liquor (44 mL). Lifestyle   Work with your health care provider to maintain a healthy body weight or to lose weight. Ask what an ideal weight is for you.  Get at least 30 minutes of exercise most days of the week. Activities may include walking, swimming, or biking.  Include exercise to strengthen your muscles (resistance exercise),  such as Pilates or lifting weights, as part of your weekly exercise routine. Try to do these types of exercises for 30 minutes at least 3 days a   week.  Do not use any products that contain nicotine or tobacco, such as cigarettes, e-cigarettes, and chewing tobacco. If you need help quitting, ask your health care provider.  Monitor your blood pressure at home as told by your health care provider.  Keep all follow-up visits as told by your health care provider. This is important. Medicines  Take over-the-counter and prescription medicines only as told by your health care provider. Follow directions carefully. Blood pressure medicines must be taken as prescribed.  Do not skip doses of blood pressure medicine. Doing this puts you at risk for problems and can make the medicine less effective.  Ask your health care provider about side effects or reactions to medicines that you should watch for. Contact a health care provider if you:  Think you are having a reaction to a medicine you are taking.  Have headaches that keep coming back (recurring).  Feel dizzy.  Have swelling in your ankles.  Have trouble with your vision. Get help right away if you:  Develop a severe headache or confusion.  Have unusual weakness or numbness.  Feel faint.  Have severe pain in your chest or abdomen.  Vomit repeatedly.  Have trouble breathing. Summary  Hypertension is when the force of blood pumping through your arteries is too strong. If this condition is not controlled, it may put you at risk for serious complications.  Your personal target blood pressure may vary depending on your medical conditions, your age, and other factors. For most people, a normal blood pressure is less than 120/80.  Hypertension is treated with lifestyle changes, medicines, or a combination of both. Lifestyle changes include losing weight, eating a healthy, low-sodium diet, exercising more, and limiting alcohol. This  information is not intended to replace advice given to you by your health care provider. Make sure you discuss any questions you have with your health care provider. Document Revised: 04/20/2018 Document Reviewed: 04/20/2018 Elsevier Patient Education  2020 [employer] Inc.  

## 2019-12-16 MED FILL — OZEMPIC 0.25 OR 0.5 MG/DOSE: 2 | 84 days supply | Qty: 5 | Fill #0

## 2020-03-13 ENCOUNTER — Ambulatory Visit: Payer: 59 | Admitting: Emergency Medicine

## 2020-03-26 ENCOUNTER — Encounter: Payer: Self-pay | Admitting: Emergency Medicine

## 2020-03-26 ENCOUNTER — Telehealth: Payer: Self-pay | Admitting: *Deleted

## 2020-03-26 ENCOUNTER — Other Ambulatory Visit: Payer: Self-pay | Admitting: Emergency Medicine

## 2020-03-26 ENCOUNTER — Ambulatory Visit: Payer: 59 | Admitting: Emergency Medicine

## 2020-03-26 ENCOUNTER — Other Ambulatory Visit: Payer: Self-pay

## 2020-03-26 VITALS — BP 184/120 | HR 103 | Temp 98.1°F | Resp 16 | Ht 67.0 in | Wt 157.0 lb

## 2020-03-26 DIAGNOSIS — E1165 Type 2 diabetes mellitus with hyperglycemia: Secondary | ICD-10-CM | POA: Diagnosis not present

## 2020-03-26 DIAGNOSIS — E785 Hyperlipidemia, unspecified: Secondary | ICD-10-CM | POA: Diagnosis not present

## 2020-03-26 DIAGNOSIS — N529 Male erectile dysfunction, unspecified: Secondary | ICD-10-CM

## 2020-03-26 DIAGNOSIS — I10 Essential (primary) hypertension: Secondary | ICD-10-CM | POA: Diagnosis not present

## 2020-03-26 DIAGNOSIS — N5082 Scrotal pain: Secondary | ICD-10-CM | POA: Insufficient documentation

## 2020-03-26 DIAGNOSIS — E1159 Type 2 diabetes mellitus with other circulatory complications: Secondary | ICD-10-CM

## 2020-03-26 DIAGNOSIS — E1169 Type 2 diabetes mellitus with other specified complication: Secondary | ICD-10-CM | POA: Diagnosis not present

## 2020-03-26 LAB — POCT GLYCOSYLATED HEMOGLOBIN (HGB A1C): Hemoglobin A1C: 9.3 % — AB (ref 4.0–5.6)

## 2020-03-26 LAB — GLUCOSE, POCT (MANUAL RESULT ENTRY): POC Glucose: 179 mg/dl — AB (ref 70–99)

## 2020-03-26 MED ORDER — SILDENAFIL CITRATE 100 MG PO TABS: 50.0000 mg | ORAL_TABLET | Freq: Every day | ORAL | 11 refills | Status: DC | PRN

## 2020-03-26 MED ORDER — AMLODIPINE BESYLATE 5 MG PO TABS: 5.0000 mg | ORAL_TABLET | Freq: Every day | ORAL | 3 refills | Status: DC

## 2020-03-26 MED ORDER — LOSARTAN POTASSIUM 100 MG PO TABS: 100.0000 mg | ORAL_TABLET | Freq: Every day | ORAL | 3 refills | Status: DC

## 2020-03-26 MED ORDER — OZEMPIC (0.25 OR 0.5 MG/DOSE) 2 MG/1.5ML ~~LOC~~ SOPN: 1.0000 mg | PEN_INJECTOR | SUBCUTANEOUS | 5 refills | Status: DC

## 2020-03-26 MED FILL — SILDENAFIL CITRATE 100 MG T: 100 | 30 days supply | Qty: 6 | Fill #0

## 2020-03-26 MED FILL — LOSARTAN POTASSIUM 100 MG T: 100 | 90 days supply | Qty: 90 | Fill #0

## 2020-03-26 MED FILL — AMLODIPINE BESYLATE 5 MG TA: 5 | 90 days supply | Qty: 90 | Fill #0

## 2020-03-26 NOTE — Patient Instructions (Addendum)
   If you have lab work done today you will be contacted with your lab results within the next 2 weeks.  If you have not heard from us then please contact us. The fastest way to get your results is to register for My Chart.   IF you received an x-ray today, you will receive an invoice from [facility]. Please contact [facility] at [phone] with questions or concerns regarding your invoice.   IF you received labwork today, you will receive an invoice from [facility]. Please contact [facility] at [phone] with questions or concerns regarding your invoice.   Our billing staff will not be able to assist you with questions regarding bills from these companies.  You will be contacted with the lab results as soon as they are available. The fastest way to get your results is to activate your My Chart account. Instructions are located on the last page of this paperwork. If you have not heard from us regarding the results in 2 weeks, please contact this office.      Diabetes Mellitus and Nutrition, Adult When you have diabetes (diabetes mellitus), it is very important to have healthy eating habits because your blood sugar (glucose) levels are greatly affected by what you eat and drink. Eating healthy foods in the appropriate amounts, at about the same times every day, can help you:  Control your blood glucose.  Lower your risk of heart disease.  Improve your blood pressure.  Reach or maintain a healthy weight. Every person with diabetes is different, and each person has different needs for a meal plan. Your health care provider may recommend that you work with a diet and nutrition specialist (dietitian) to make a meal plan that is best for you. Your meal plan may vary depending on factors such as:  The calories you need.  The medicines you take.  Your weight.  Your blood glucose, blood pressure, and cholesterol levels.  Your activity level.  Other health  conditions you have, such as heart or kidney disease. How do carbohydrates affect me? Carbohydrates, also called carbs, affect your blood glucose level more than any other type of food. Eating carbs naturally raises the amount of glucose in your blood. Carb counting is a method for keeping track of how many carbs you eat. Counting carbs is important to keep your blood glucose at a healthy level, especially if you use insulin or take certain oral diabetes medicines. It is important to know how many carbs you can safely have in each meal. This is different for every person. Your dietitian can help you calculate how many carbs you should have at each meal and for each snack. Foods that contain carbs include:  Bread, cereal, rice, pasta, and crackers.  Potatoes and corn.  Peas, beans, and lentils.  Milk and yogurt.  Fruit and juice.  Desserts, such as cakes, cookies, ice cream, and candy. How does alcohol affect me? Alcohol can cause a sudden decrease in blood glucose (hypoglycemia), especially if you use insulin or take certain oral diabetes medicines. Hypoglycemia can be a life-threatening condition. Symptoms of hypoglycemia (sleepiness, dizziness, and confusion) are similar to symptoms of having too much alcohol. If your health care provider says that alcohol is safe for you, follow these guidelines:  Limit alcohol intake to no more than 1 drink per day for nonpregnant women and 2 drinks per day for men. One drink equals 12 oz of beer, 5 oz of wine, or 1 oz of hard   liquor.  Do not drink on an empty stomach.  Keep yourself hydrated with water, diet soda, or unsweetened iced tea.  Keep in mind that regular soda, juice, and other mixers may contain a lot of sugar and must be counted as carbs. What are tips for following this plan?  Reading food labels  Start by checking the serving size on the "Nutrition Facts" label of packaged foods and drinks. The amount of calories, carbs, fats, and  other nutrients listed on the label is based on one serving of the item. Many items contain more than one serving per package.  Check the total grams (g) of carbs in one serving. You can calculate the number of servings of carbs in one serving by dividing the total carbs by 15. For example, if a food has 30 g of total carbs, it would be equal to 2 servings of carbs.  Check the number of grams (g) of saturated and trans fats in one serving. Choose foods that have low or no amount of these fats.  Check the number of milligrams (mg) of salt (sodium) in one serving. Most people should limit total sodium intake to less than 2,300 mg per day.  Always check the nutrition information of foods labeled as "low-fat" or "nonfat". These foods may be higher in added sugar or refined carbs and should be avoided.  Talk to your dietitian to identify your daily goals for nutrients listed on the label. Shopping  Avoid buying canned, premade, or processed foods. These foods tend to be high in fat, sodium, and added sugar.  Shop around the outside edge of the grocery store. This includes fresh fruits and vegetables, bulk grains, fresh meats, and fresh dairy. Cooking  Use low-heat cooking methods, such as baking, instead of high-heat cooking methods like deep frying.  Cook using healthy oils, such as olive, canola, or sunflower oil.  Avoid cooking with butter, cream, or high-fat meats. Meal planning  Eat meals and snacks regularly, preferably at the same times every day. Avoid going long periods of time without eating.  Eat foods high in fiber, such as fresh fruits, vegetables, beans, and whole grains. Talk to your dietitian about how many servings of carbs you can eat at each meal.  Eat 4-6 ounces (oz) of lean protein each day, such as lean meat, chicken, fish, eggs, or tofu. One oz of lean protein is equal to: ? 1 oz of meat, chicken, or fish. ? 1 egg. ?  cup of tofu.  Eat some foods each day that  contain healthy fats, such as avocado, nuts, seeds, and fish. Lifestyle  Check your blood glucose regularly.  Exercise regularly as told by your health care provider. This may include: ? 150 minutes of moderate-intensity or vigorous-intensity exercise each week. This could be brisk walking, biking, or water aerobics. ? Stretching and doing strength exercises, such as yoga or weightlifting, at least 2 times a week.  Take medicines as told by your health care provider.  Do not use any products that contain nicotine or tobacco, such as cigarettes and e-cigarettes. If you need help quitting, ask your health care provider.  Work with a counselor or diabetes educator to identify strategies to manage stress and any emotional and social challenges. Questions to ask a health care provider  Do I need to meet with a diabetes educator?  Do I need to meet with a dietitian?  What number can I call if I have questions?  When are the best   times to check my blood glucose? Where to find more information:  American Diabetes Association: diabetes.org  Academy of Nutrition and Dietetics: www.eatright.org  National Institute of Diabetes and Digestive and Kidney Diseases (NIH): www.niddk.nih.gov Summary  A healthy meal plan will help you control your blood glucose and maintain a healthy lifestyle.  Working with a diet and nutrition specialist (dietitian) can help you make a meal plan that is best for you.  Keep in mind that carbohydrates (carbs) and alcohol have immediate effects on your blood glucose levels. It is important to count carbs and to use alcohol carefully. This information is not intended to replace advice given to you by your health care provider. Make sure you discuss any questions you have with your health care provider. Document Revised: 07/23/2017 Document Reviewed: 09/14/2016 Elsevier Patient Education  2020 [employer]  

## 2020-03-26 NOTE — Assessment & Plan Note (Signed)
Uncontrolled diabetes with hemoglobin A1c of 9.3 but much better than before. Continue Metformin 1000 mg twice a day, Januvia 100 mg once a day.  Will increase Ozempic to 1 mg weekly.  Diet and nutrition discussed. Follow-up in 3 months.

## 2020-03-26 NOTE — Telephone Encounter (Signed)
Called patient to let him know the doctor printed another After Visit Summary and he can pick it up at check in tomorrow, if possible. If not, I will mail the new AVS.

## 2020-03-26 NOTE — Progress Notes (Signed)
[name] 59 y.o.   Chief Complaint  Patient presents with  . Diabetes    follow up 3 month  . Hypertension  . Medication Refill    PEND    HISTORY OF PRESENT ILLNESS: This is a 59 y.o. male with history of diabetes, hypertension, dyslipidemia here for follow-up and medication refill. 1.  Diabetes: On Metformin 1000 mg twice a day, Januvia 100 mg once a day, and Ozempic 0.5 mg weekly.  Blood glucose at home between 180 and 220. 2.  Hypertension: On Norvasc 5 mg daily and Cozaar 100 mg daily.  Has not taken medication in 2 days.  Needs refills.  Asymptomatic. 3.  Dyslipidemia: On Crestor 10 mg daily. Also requesting refill on Viagra. No other complaints or medical concerns today.  HPI   Prior to Admission medications   Medication Sig Start Date End Date Taking? Authorizing Provider  metFORMIN (GLUCOPHAGE) 1000 MG tablet TAKE 1 TABLET BY MOUTH 2 TIMES DAILY WITH A MEAL. 12/13/19  Yes Cindee Mclester, Ines Bloomer, MD  rosuvastatin (CRESTOR) 10 MG tablet Take 1 tablet (10 mg total) by mouth daily. 12/13/19  Yes Meleny Tregoning, Ines Bloomer, MD  Semaglutide,0.25 or 0.5MG/DOS, (OZEMPIC, 0.25 OR 0.5 MG/DOSE,) 2 MG/1.5ML SOPN Inject 0.5 mg into the skin once a week. 12/13/19  Yes Amaia Lavallie, Ines Bloomer, MD  sildenafil (VIAGRA) 100 MG tablet Take 0.5-1 tablets (50-100 mg total) by mouth daily as needed for erectile dysfunction. 12/13/19  Yes Felise Georgia, Ines Bloomer, MD  amLODipine (NORVASC) 5 MG tablet Take 1 tablet (5 mg total) by mouth daily. 12/13/19 03/12/20  Horald Pollen, MD  atovaquone-proguanil (MALARONE) 250-100 MG TABS Take 1 tablet by mouth daily. Patient not taking: Reported on 03/26/2020 05/16/14   Roselee Culver, MD  blood glucose meter kit and supplies KIT Use up to four times daily as directed. Dispense based on pt & ins preference. Dx: E11.65, Z79.4 07/25/18   Horald Pollen, MD  glucose blood [facility] VERIO) test strip Use 2x a day 06/02/15   Darlyne Russian, MD  Insulin  Glargine (LANTUS SOLOSTAR) 100 UNIT/ML Solostar Pen INJECT 25 UNITS UNDER THE SKIN AT BEDTIME. INCREASE BY 2 UNITS EVERY 2 DAYS UNTIL READINGS REMAIN UNDER 200. Patient not taking: Reported on 03/26/2020 07/07/18   Horald Pollen, MD  Insulin Pen Needle (CAREFINE PEN NEEDLES) 32G X 4 MM MISC Use once a day 07/07/18   Horald Pollen, MD  losartan (COZAAR) 100 MG tablet Take 1 tablet (100 mg total) by mouth daily. 12/13/19 03/12/20  Horald Pollen, MD  ONE TOUCH LANCETS MISC Use 2x a day 07/07/18   Horald Pollen, MD  sitaGLIPtin (JANUVIA) 100 MG tablet Take 1 tablet (100 mg total) by mouth daily. Patient not taking: Reported on 12/13/2019 07/07/18 10/05/18  Horald Pollen, MD    Allergies  Allergen Reactions  . Dulaglutide Diarrhea  . Food     pork  . Penicillins     REACTION: swelling and itching    Patient Active Problem List   Diagnosis Date Noted  . Abnormal EKG 10/04/2012  . COLONIC POLYPS, ADENOMATOUS, BENIGN 04/21/2010  . VENTRICULAR HYPERTROPHY, LEFT 03/26/2010  . SLEEP APNEA 02/12/2010  . GOUT, UNSPECIFIED 12/12/2009  . Type 2 diabetes mellitus with hyperglycemia, with long-term current use of insulin ([place]) 11/12/2009  . SNORING 10/28/2009  . DEPRESSION 06/13/2008  . PTERYGIUM, BILATERAL 05/08/2007  . ERECTILE DYSFUNCTION, SECONDARY TO MEDICATION 05/08/2007  . Hyperlipidemia 04/21/2007  . Essential hypertension  04/21/2007    Past Medical History:  Diagnosis Date  . Allergy   . Diabetes mellitus without complication ([place])    Diet controlled  . HTN (hypertension)     Past Surgical History:  Procedure Laterality Date  . None      Social History   Socioeconomic History  . Marital status: Married    Spouse name: Not on file  . Number of children: 4  . Years of education: Not on file  . Highest education level: Not on file  Occupational History    Employer: [employer]  Tobacco Use  . Smoking status: Never Smoker  .  Smokeless tobacco: Never Used  Vaping Use  . Vaping Use: Never used  Substance and Sexual Activity  . Alcohol use: Yes    Comment: occasionally  . Drug use: Never  . Sexual activity: Not on file  Other Topics Concern  . Not on file  Social History Narrative   Lives with wife.m     Social Determinants of Health   Financial Resource Strain:   . Difficulty of Paying Living Expenses:   Food Insecurity:   . Worried About Charity fundraiser in the Last Year:   . Arboriculturist in the Last Year:   Transportation Needs:   . Film/video editor (Medical):   Marland Kitchen Lack of Transportation (Non-Medical):   Physical Activity:   . Days of Exercise per Week:   . Minutes of Exercise per Session:   Stress:   . Feeling of Stress :   Social Connections:   . Frequency of Communication with Friends and Family:   . Frequency of Social Gatherings with Friends and Family:   . Attends Religious Services:   . Active Member of Clubs or Organizations:   . Attends Archivist Meetings:   Marland Kitchen Marital Status:   Intimate Partner Violence:   . Fear of Current or Ex-Partner:   . Emotionally Abused:   Marland Kitchen Physically Abused:   . Sexually Abused:     Family History  Problem Relation Age of Onset  . Diabetes Mother   . Hypertension Mother   . Heart disease Father        CHF     Review of Systems  Constitutional: Negative.  Negative for chills and fever.  HENT: Negative.  Negative for congestion and sore throat.   Respiratory: Negative.  Negative for cough and shortness of breath.   Cardiovascular: Negative.  Negative for chest pain and palpitations.  Gastrointestinal: Negative.  Negative for abdominal pain, blood in stool, diarrhea, nausea and vomiting.  Genitourinary: Negative.  Negative for dysuria and hematuria.       Warm feeling in the scrotal area mostly at night  Musculoskeletal: Negative.  Negative for myalgias and neck pain.  Skin: Negative.  Negative for rash.  Neurological:  Negative for dizziness and headaches.  All other systems reviewed and are negative.  Today's Vitals   03/26/20 1523  BP: (!) 176/125  Pulse: (!) 103  Resp: 16  Temp: 98.1 F (36.7 C)  TempSrc: Temporal  SpO2: 98%  Weight: 157 lb (71.2 kg)  Height: 5' 7"  (1.702 m)   Body mass index is 24.59 kg/m.    Physical Exam Vitals reviewed.  Constitutional:      Appearance: Normal appearance.  HENT:     Head: Normocephalic.  Eyes:     Extraocular Movements: Extraocular movements intact.     Conjunctiva/sclera: Conjunctivae normal.  Pupils: Pupils are equal, round, and reactive to light.  Cardiovascular:     Rate and Rhythm: Normal rate and regular rhythm.     Pulses: Normal pulses.     Heart sounds: Normal heart sounds.  Pulmonary:     Effort: Pulmonary effort is normal.     Breath sounds: Normal breath sounds.  Abdominal:     Hernia: There is no hernia in the left inguinal area or right inguinal area.  Genitourinary:    Penis: Normal and circumcised.      Testes: Normal.     Epididymis:     Right: Normal.     Left: Normal.  Musculoskeletal:        General: Normal range of motion.     Cervical back: Normal range of motion and neck supple.  Lymphadenopathy:     Lower Body: No right inguinal adenopathy. No left inguinal adenopathy.  Skin:    General: Skin is warm and dry.     Capillary Refill: Capillary refill takes less than 2 seconds.  Neurological:     General: No focal deficit present.     Mental Status: He is alert and oriented to person, place, and time.  Psychiatric:        Mood and Affect: Mood normal.        Behavior: Behavior normal.    Results for orders placed or performed in visit on 03/26/20 (from the past 24 hour(s))  POCT glucose (manual entry)     Status: Abnormal   Collection Time: 03/26/20  4:05 PM  Result Value Ref Range   POC Glucose 179 (A) 70 - 99 mg/dl  POCT glycosylated hemoglobin (Hb A1C)     Status: Abnormal   Collection Time: 03/26/20   4:05 PM  Result Value Ref Range   Hemoglobin A1C 9.3 (A) 4.0 - 5.6 %   HbA1c POC (<> result, manual entry)     HbA1c, POC (prediabetic range)     HbA1c, POC (controlled diabetic range)       ASSESSMENT & PLAN: Uncontrolled type 2 diabetes mellitus with hyperglycemia (HCC) Uncontrolled diabetes with hemoglobin A1c of 9.3 but much better than before. Continue Metformin 1000 mg twice a day, Januvia 100 mg once a day.  Will increase Ozempic to 1 mg weekly.  Diet and nutrition discussed. Follow-up in 3 months.  Essential hypertension Uncontrolled hypertension.  Off medications for 2 days.  Asymptomatic.  Will restart medications today.  Follow-up in 3 months.  Danny Vazquez was seen today for diabetes, hypertension and medication refill.  Diagnoses and all orders for this visit:  Uncontrolled type 2 diabetes mellitus with hyperglycemia ([place]) -     POCT glucose (manual entry) -     POCT glycosylated hemoglobin (Hb A1C) -     HM Diabetes Foot Exam -     Semaglutide,0.25 or 0.5MG/DOS, (OZEMPIC, 0.25 OR 0.5 MG/DOSE,) 2 MG/1.5ML SOPN; Inject 0.75 mLs (1 mg total) into the skin once a week.  Erectile dysfunction, unspecified erectile dysfunction type -     sildenafil (VIAGRA) 100 MG tablet; Take 0.5-1 tablets (50-100 mg total) by mouth daily as needed for erectile dysfunction.  Hypertension associated with diabetes ([place]) -     CBC with Differential/Platelet -     Comprehensive metabolic panel  Dyslipidemia associated with type 2 diabetes mellitus ([place]) -     Lipid panel  Essential hypertension -     amLODipine (NORVASC) 5 MG tablet; Take 1 tablet (5 mg total) by mouth daily. -  losartan (COZAAR) 100 MG tablet; Take 1 tablet (100 mg total) by mouth daily.  Scrotum pain -     Ambulatory referral to Urology  Uncontrolled hypertension    Patient Instructions       If you have lab work done today you will be contacted with your lab results within the next 2 weeks.  If you have not  heard from [place] then please contact us. The fastest way to get your results is to register for My Chart.   IF you received an x-ray today, you will receive an invoice from [facility]. Please contact [facility] at [phone] with questions or concerns regarding your invoice.   IF you received labwork today, you will receive an invoice from [facility]. Please contact [facility] at [phone] with questions or concerns regarding your invoice.   Our billing staff will not be able to assist you with questions regarding bills from these companies.  You will be contacted with the lab results as soon as they are available. The fastest way to get your results is to activate your My Chart account. Instructions are located on the last page of this paperwork. If you have not heard from [place] regarding the results in 2 weeks, please contact this office.     Diabetes Mellitus and Nutrition, Adult When you have diabetes (diabetes mellitus), it is very important to have healthy eating habits because your blood sugar (glucose) levels are greatly affected by what you eat and drink. Eating healthy foods in the appropriate amounts, at about the same times every day, can help you:  Control your blood glucose.  Lower your risk of heart disease.  Improve your blood pressure.  Reach or maintain a healthy weight. Every person with diabetes is different, and each person has different needs for a meal plan. Your health care provider may recommend that you work with a diet and nutrition specialist (dietitian) to make a meal plan that is best for you. Your meal plan may vary depending on factors such as:  The calories you need.  The medicines you take.  Your weight.  Your blood glucose, blood pressure, and cholesterol levels.  Your activity level.  Other health conditions you have, such as heart or kidney disease. How do carbohydrates affect me? Carbohydrates, also called carbs, affect your  blood glucose level more than any other type of food. Eating carbs naturally raises the amount of glucose in your blood. Carb counting is a method for keeping track of how many carbs you eat. Counting carbs is important to keep your blood glucose at a healthy level, especially if you use insulin or take certain oral diabetes medicines. It is important to know how many carbs you can safely have in each meal. This is different for every person. Your dietitian can help you calculate how many carbs you should have at each meal and for each snack. Foods that contain carbs include:  Bread, cereal, rice, pasta, and crackers.  Potatoes and corn.  Peas, beans, and lentils.  Milk and yogurt.  Fruit and juice.  Desserts, such as cakes, cookies, ice cream, and candy. How does alcohol affect me? Alcohol can cause a sudden decrease in blood glucose (hypoglycemia), especially if you use insulin or take certain oral diabetes medicines. Hypoglycemia can be a life-threatening condition. Symptoms of hypoglycemia (sleepiness, dizziness, and confusion) are similar to symptoms of having too much alcohol. If your health care provider says that alcohol is safe for you, follow these guidelines:  Limit alcohol intake to no more than 1 drink per day for nonpregnant women and 2 drinks per day for men. One drink equals 12 oz of beer, 5 oz of wine, or 1 oz of hard liquor.  Do not drink on an empty stomach.  Keep yourself hydrated with water, diet soda, or unsweetened iced tea.  Keep in mind that regular soda, juice, and other mixers may contain a lot of sugar and must be counted as carbs. What are tips for following this plan?  Reading food labels  Start by checking the serving size on the "Nutrition Facts" label of packaged foods and drinks. The amount of calories, carbs, fats, and other nutrients listed on the label is based on one serving of the item. Many items contain more than one serving per  package.  Check the total grams (g) of carbs in one serving. You can calculate the number of servings of carbs in one serving by dividing the total carbs by 15. For example, if a food has 30 g of total carbs, it would be equal to 2 servings of carbs.  Check the number of grams (g) of saturated and trans fats in one serving. Choose foods that have low or no amount of these fats.  Check the number of milligrams (mg) of salt (sodium) in one serving. Most people should limit total sodium intake to less than 2,300 mg per day.  Always check the nutrition information of foods labeled as "low-fat" or "nonfat". These foods may be higher in added sugar or refined carbs and should be avoided.  Talk to your dietitian to identify your daily goals for nutrients listed on the label. Shopping  Avoid buying canned, premade, or processed foods. These foods tend to be high in fat, sodium, and added sugar.  Shop around the outside edge of the grocery store. This includes fresh fruits and vegetables, bulk grains, fresh meats, and fresh dairy. Cooking  Use low-heat cooking methods, such as baking, instead of high-heat cooking methods like deep frying.  Cook using healthy oils, such as olive, canola, or sunflower oil.  Avoid cooking with butter, cream, or high-fat meats. Meal planning  Eat meals and snacks regularly, preferably at the same times every day. Avoid going long periods of time without eating.  Eat foods high in fiber, such as fresh fruits, vegetables, beans, and whole grains. Talk to your dietitian about how many servings of carbs you can eat at each meal.  Eat 4-6 ounces (oz) of lean protein each day, such as lean meat, chicken, fish, eggs, or tofu. One oz of lean protein is equal to: ? 1 oz of meat, chicken, or fish. ? 1 egg. ?  cup of tofu.  Eat some foods each day that contain healthy fats, such as avocado, nuts, seeds, and fish. Lifestyle  Check your blood glucose  regularly.  Exercise regularly as told by your health care provider. This may include: ? 150 minutes of moderate-intensity or vigorous-intensity exercise each week. This could be brisk walking, biking, or water aerobics. ? Stretching and doing strength exercises, such as yoga or weightlifting, at least 2 times a week.  Take medicines as told by your health care provider.  Do not use any products that contain nicotine or tobacco, such as cigarettes and e-cigarettes. If you need help quitting, ask your health care provider.  Work with a Social worker or diabetes educator to identify strategies to manage stress and any emotional and social challenges. Questions to ask a  health care provider  Do I need to meet with a diabetes educator?  Do I need to meet with a dietitian?  What number can I call if I have questions?  When are the best times to check my blood glucose? Where to find more information:  American Diabetes Association: diabetes.org  Academy of Nutrition and Dietetics: www.eatright.[employer] of Diabetes and Digestive and Kidney Diseases (NIH): [employer] Summary  A healthy meal plan will help you control your blood glucose and maintain a healthy lifestyle.  Working with a diet and nutrition specialist (dietitian) can help you make a meal plan that is best for you.  Keep in mind that carbohydrates (carbs) and alcohol have immediate effects on your blood glucose levels. It is important to count carbs and to use alcohol carefully. This information is not intended to replace advice given to you by your health care provider. Make sure you discuss any questions you have with your health care provider. Document Revised: 07/23/2017 Document Reviewed: 09/14/2016 Elsevier Patient Education  2020 Elsevier Inc.      Agustina Caroli, MD [facility]

## 2020-03-26 NOTE — Assessment & Plan Note (Signed)
Uncontrolled hypertension.  Off medications for 2 days.  Asymptomatic.  Will restart medications today.  Follow-up in 3 months.

## 2020-03-27 ENCOUNTER — Other Ambulatory Visit: Payer: Self-pay | Admitting: Emergency Medicine

## 2020-03-27 LAB — CBC WITH DIFFERENTIAL/PLATELET
Basophils Absolute: 0.1 10*3/uL (ref 0.0–0.2)
Basos: 1 %
EOS (ABSOLUTE): 0.1 10*3/uL (ref 0.0–0.4)
Eos: 2 %
Hematocrit: 48.5 % (ref 37.5–51.0)
Hemoglobin: 16.4 g/dL (ref 13.0–17.7)
Immature Grans (Abs): 0 10*3/uL (ref 0.0–0.1)
Immature Granulocytes: 0 %
Lymphocytes Absolute: 2.3 10*3/uL (ref 0.7–3.1)
Lymphs: 51 %
MCH: 30.2 pg (ref 26.6–33.0)
MCHC: 33.8 g/dL (ref 31.5–35.7)
MCV: 89 fL (ref 79–97)
Monocytes Absolute: 0.4 10*3/uL (ref 0.1–0.9)
Monocytes: 9 %
Neutrophils Absolute: 1.6 10*3/uL (ref 1.4–7.0)
Neutrophils: 37 %
Platelets: 247 10*3/uL (ref 150–450)
RBC: 5.43 x10E6/uL (ref 4.14–5.80)
RDW: 13.3 % (ref 11.6–15.4)
WBC: 4.5 10*3/uL (ref 3.4–10.8)

## 2020-03-27 LAB — COMPREHENSIVE METABOLIC PANEL
ALT: 12 IU/L (ref 0–44)
AST: 17 IU/L (ref 0–40)
Albumin/Globulin Ratio: 1.6 (ref 1.2–2.2)
Albumin: 4.3 g/dL (ref 3.8–4.9)
Alkaline Phosphatase: 86 IU/L (ref 48–121)
BUN/Creatinine Ratio: 17 (ref 10–24)
BUN: 19 mg/dL (ref 8–27)
Bilirubin Total: 0.9 mg/dL (ref 0.0–1.2)
CO2: 25 mmol/L (ref 20–29)
Calcium: 9.2 mg/dL (ref 8.6–10.2)
Chloride: 102 mmol/L (ref 96–106)
Creatinine, Ser: 1.09 mg/dL (ref 0.76–1.27)
GFR calc Af Amer: 85 mL/min/{1.73_m2} (ref >59)
GFR calc non Af Amer: 73 mL/min/{1.73_m2} (ref >59)
Globulin, Total: 2.7 g/dL (ref 1.5–4.5)
Glucose: 176 mg/dL — ABNORMAL HIGH (ref 65–99)
Potassium: 4 mmol/L (ref 3.5–5.2)
Sodium: 140 mmol/L (ref 134–144)
Total Protein: 7 g/dL (ref 6.0–8.5)

## 2020-03-27 LAB — LIPID PANEL
Chol/HDL Ratio: 3.4 ratio (ref 0.0–5.0)
Cholesterol, Total: 159 mg/dL (ref 100–199)
HDL: 47 mg/dL (ref >39)
LDL Chol Calc (NIH): 90 mg/dL (ref 0–99)
Triglycerides: 121 mg/dL (ref 0–149)
VLDL Cholesterol Cal: 22 mg/dL (ref 5–40)

## 2020-03-27 MED ORDER — OZEMPIC (1 MG/DOSE) 2 MG/1.5ML ~~LOC~~ SOPN: 1.0000 mg | PEN_INJECTOR | SUBCUTANEOUS | 5 refills | Status: DC

## 2020-03-27 MED FILL — OZEMPIC (1 MG/DOSE) 4 MG/3M: 4 | 56 days supply | Qty: 6 | Fill #0

## 2020-03-28 ENCOUNTER — Telehealth: Payer: Self-pay

## 2020-03-28 ENCOUNTER — Other Ambulatory Visit: Payer: Self-pay | Admitting: Emergency Medicine

## 2020-03-28 MED ORDER — TERBINAFINE HCL 250 MG PO TABS: 250.0000 mg | ORAL_TABLET | Freq: Every day | ORAL | 1 refills | Status: AC

## 2020-03-28 MED FILL — TERBINAFINE HCL 250 MG TAB: 250 | 10 days supply | Qty: 10 | Fill #0

## 2020-03-28 NOTE — Telephone Encounter (Signed)
Prescription sent

## 2020-03-28 NOTE — Telephone Encounter (Signed)
Pt. Called requesting script for foot fungus. Pt. Asserts he had discussed this with Dr. Mitchel Honour on previous visit and was anticipating a prescription.

## 2020-03-28 NOTE — Telephone Encounter (Signed)
Please Advise

## 2020-05-30 ENCOUNTER — Telehealth: Payer: Self-pay | Admitting: Emergency Medicine

## 2020-05-30 NOTE — Telephone Encounter (Signed)
Hello Danny Vazquez,  We are reaching out regarding a referral Dr. Mitchel Honour sent for you to [facility] for lower body pain. Do you still need this referral? If so feel free to call [phone] and schedule. Please let us know if you no longer need the referral.

## 2020-06-17 ENCOUNTER — Other Ambulatory Visit (HOSPITAL_COMMUNITY): Payer: Self-pay | Admitting: Emergency Medicine

## 2020-06-17 ENCOUNTER — Ambulatory Visit: Payer: 59 | Admitting: Emergency Medicine

## 2020-06-17 ENCOUNTER — Encounter: Payer: Self-pay | Admitting: Emergency Medicine

## 2020-06-17 ENCOUNTER — Other Ambulatory Visit: Payer: Self-pay

## 2020-06-17 VITALS — BP 160/100 | HR 102 | Temp 98.7°F | Ht 66.0 in | Wt 155.0 lb

## 2020-06-17 DIAGNOSIS — I10 Essential (primary) hypertension: Secondary | ICD-10-CM | POA: Diagnosis not present

## 2020-06-17 DIAGNOSIS — E1169 Type 2 diabetes mellitus with other specified complication: Secondary | ICD-10-CM

## 2020-06-17 DIAGNOSIS — I152 Hypertension secondary to endocrine disorders: Secondary | ICD-10-CM

## 2020-06-17 DIAGNOSIS — E1159 Type 2 diabetes mellitus with other circulatory complications: Secondary | ICD-10-CM

## 2020-06-17 DIAGNOSIS — E1165 Type 2 diabetes mellitus with hyperglycemia: Secondary | ICD-10-CM

## 2020-06-17 DIAGNOSIS — Z23 Encounter for immunization: Secondary | ICD-10-CM | POA: Diagnosis not present

## 2020-06-17 DIAGNOSIS — E785 Hyperlipidemia, unspecified: Secondary | ICD-10-CM | POA: Diagnosis not present

## 2020-06-17 LAB — GLUCOSE, POCT (MANUAL RESULT ENTRY): POC Glucose: 161 mg/dl — AB (ref 70–99)

## 2020-06-17 LAB — POCT GLYCOSYLATED HEMOGLOBIN (HGB A1C): Hemoglobin A1C: 7 % — AB (ref 4.0–5.6)

## 2020-06-17 MED ORDER — OZEMPIC (1 MG/DOSE) 2 MG/1.5ML ~~LOC~~ SOPN: 1.0000 mg | PEN_INJECTOR | SUBCUTANEOUS | 5 refills | Status: DC

## 2020-06-17 MED ORDER — AMLODIPINE BESYLATE 10 MG PO TABS: 10.0000 mg | ORAL_TABLET | Freq: Every day | ORAL | 3 refills | Status: DC

## 2020-06-17 MED ORDER — LOSARTAN POTASSIUM-HCTZ 100-12.5 MG PO TABS: 1.0000 | ORAL_TABLET | Freq: Every day | ORAL | 3 refills | Status: DC

## 2020-06-17 MED FILL — AMLODIPINE BESYLATE 10 MG T: 10 | 90 days supply | Qty: 90 | Fill #0

## 2020-06-17 MED FILL — OZEMPIC (1 MG/DOSE) 4 MG/3M: 4 | 56 days supply | Qty: 6 | Fill #0

## 2020-06-17 MED FILL — LOSARTAN-HCTZ 100-12.5 MG T: 100-12.5 | 90 days supply | Qty: 90 | Fill #0

## 2020-06-17 NOTE — Assessment & Plan Note (Signed)
Uncontrolled hypertension.  Will increase amlodipine to 10 mg daily and change losartan to losartan-HCTZ 100-12.5 mg daily. Much improved diabetes with hemoglobin A1c down to 7.0.  Continue present treatment with Metformin, Januvia, and Ozempic. Follow-up in 6 months.

## 2020-06-17 NOTE — Patient Instructions (Addendum)
If you have lab work done today you will be contacted with your lab results within the next 2 weeks.  If you have not heard from [place] then please contact us. The fastest way to get your results is to register for My Chart.   IF you received an x-ray today, you will receive an invoice from [facility]. Please contact [facility] at [phone] with questions or concerns regarding your invoice.   IF you received labwork today, you will receive an invoice from [facility]. Please contact [facility] at [phone] with questions or concerns regarding your invoice.   Our billing staff will not be able to assist you with questions regarding bills from these companies.  You will be contacted with the lab results as soon as they are available. The fastest way to get your results is to activate your My Chart account. Instructions are located on the last page of this paperwork. If you have not heard from [place] regarding the results in 2 weeks, please contact this office.      Diabetes Mellitus and Nutrition, Adult When you have diabetes (diabetes mellitus), it is very important to have healthy eating habits because your blood sugar (glucose) levels are greatly affected by what you eat and drink. Eating healthy foods in the appropriate amounts, at about the same times every day, can help you:  Control your blood glucose.  Lower your risk of heart disease.  Improve your blood pressure.  Reach or maintain a healthy weight. Every person with diabetes is different, and each person has different needs for a meal plan. Your health care provider may recommend that you work with a diet and nutrition specialist (dietitian) to make a meal plan that is best for you. Your meal plan may vary depending on factors such as:  The calories you need.  The medicines you take.  Your weight.  Your blood glucose, blood pressure, and cholesterol levels.  Your activity level.  Other health  conditions you have, such as heart or kidney disease. How do carbohydrates affect me? Carbohydrates, also called carbs, affect your blood glucose level more than any other type of food. Eating carbs naturally raises the amount of glucose in your blood. Carb counting is a method for keeping track of how many carbs you eat. Counting carbs is important to keep your blood glucose at a healthy level, especially if you use insulin or take certain oral diabetes medicines. It is important to know how many carbs you can safely have in each meal. This is different for every person. Your dietitian can help you calculate how many carbs you should have at each meal and for each snack. Foods that contain carbs include:  Bread, cereal, rice, pasta, and crackers.  Potatoes and corn.  Peas, beans, and lentils.  Milk and yogurt.  Fruit and juice.  Desserts, such as cakes, cookies, ice cream, and candy. How does alcohol affect me? Alcohol can cause a sudden decrease in blood glucose (hypoglycemia), especially if you use insulin or take certain oral diabetes medicines. Hypoglycemia can be a life-threatening condition. Symptoms of hypoglycemia (sleepiness, dizziness, and confusion) are similar to symptoms of having too much alcohol. If your health care provider says that alcohol is safe for you, follow these guidelines:  Limit alcohol intake to no more than 1 drink per day for nonpregnant women and 2 drinks per day for men. One drink equals 12 oz of beer, 5 oz of wine, or 1 oz of hard  liquor.  Do not drink on an empty stomach.  Keep yourself hydrated with water, diet soda, or unsweetened iced tea.  Keep in mind that regular soda, juice, and other mixers may contain a lot of sugar and must be counted as carbs. What are tips for following this plan?  Reading food labels  Start by checking the serving size on the "Nutrition Facts" label of packaged foods and drinks. The amount of calories, carbs, fats, and  other nutrients listed on the label is based on one serving of the item. Many items contain more than one serving per package.  Check the total grams (g) of carbs in one serving. You can calculate the number of servings of carbs in one serving by dividing the total carbs by 15. For example, if a food has 30 g of total carbs, it would be equal to 2 servings of carbs.  Check the number of grams (g) of saturated and trans fats in one serving. Choose foods that have low or no amount of these fats.  Check the number of milligrams (mg) of salt (sodium) in one serving. Most people should limit total sodium intake to less than 2,300 mg per day.  Always check the nutrition information of foods labeled as "low-fat" or "nonfat". These foods may be higher in added sugar or refined carbs and should be avoided.  Talk to your dietitian to identify your daily goals for nutrients listed on the label. Shopping  Avoid buying canned, premade, or processed foods. These foods tend to be high in fat, sodium, and added sugar.  Shop around the outside edge of the grocery store. This includes fresh fruits and vegetables, bulk grains, fresh meats, and fresh dairy. Cooking  Use low-heat cooking methods, such as baking, instead of high-heat cooking methods like deep frying.  Cook using healthy oils, such as olive, canola, or sunflower oil.  Avoid cooking with butter, cream, or high-fat meats. Meal planning  Eat meals and snacks regularly, preferably at the same times every day. Avoid going long periods of time without eating.  Eat foods high in fiber, such as fresh fruits, vegetables, beans, and whole grains. Talk to your dietitian about how many servings of carbs you can eat at each meal.  Eat 4-6 ounces (oz) of lean protein each day, such as lean meat, chicken, fish, eggs, or tofu. One oz of lean protein is equal to: ? 1 oz of meat, chicken, or fish. ? 1 egg. ?  cup of tofu.  Eat some foods each day that  contain healthy fats, such as avocado, nuts, seeds, and fish. Lifestyle  Check your blood glucose regularly.  Exercise regularly as told by your health care provider. This may include: ? 150 minutes of moderate-intensity or vigorous-intensity exercise each week. This could be brisk walking, biking, or water aerobics. ? Stretching and doing strength exercises, such as yoga or weightlifting, at least 2 times a week.  Take medicines as told by your health care provider.  Do not use any products that contain nicotine or tobacco, such as cigarettes and e-cigarettes. If you need help quitting, ask your health care provider.  Work with a Social worker or diabetes educator to identify strategies to manage stress and any emotional and social challenges. Questions to ask a health care provider  Do I need to meet with a diabetes educator?  Do I need to meet with a dietitian?  What number can I call if I have questions?  When are the best  times to check my blood glucose? Where to find more information:  American Diabetes Association: diabetes.org  Academy of Nutrition and Dietetics: www.eatright.[employer] of Diabetes and Digestive and Kidney Diseases (NIH): [employer] Summary  A healthy meal plan will help you control your blood glucose and maintain a healthy lifestyle.  Working with a diet and nutrition specialist (dietitian) can help you make a meal plan that is best for you.  Keep in mind that carbohydrates (carbs) and alcohol have immediate effects on your blood glucose levels. It is important to count carbs and to use alcohol carefully. This information is not intended to replace advice given to you by your health care provider. Make sure you discuss any questions you have with your health care provider. Document Revised: 07/23/2017 Document Reviewed: 09/14/2016 Elsevier Patient Education  2020 [employer].

## 2020-06-17 NOTE — Progress Notes (Signed)
Danny Vazquez 59 y.o.   Chief Complaint  Patient presents with   Medical Management of Chronic Issues    3 m f/u    Hypertension   Diabetes    HISTORY OF PRESENT ILLNESS: This is a 59 y.o. male with history of diabetes and hypertension here for follow-up. Diabetes symptoms much better.  Patient taking Metformin 1000 mg twice a day, Januvia 100 mg daily, and weekly Ozempic 1 mg. Lab Results  Component Value Date   HGBA1C 9.3 (A) 03/26/2020  Hypertension not well controlled yet.  Taking amlodipine 5 mg and losartan 100 mg daily.  Will increase doses. BP Readings from Last 3 Encounters:  06/17/20 (!) 160/100  03/26/20 (!) 184/120  12/13/19 (!) 166/102  On rosuvastatin 10 mg daily. Overall feels much better.  Has no complaints or medical concerns today.   HPI   Prior to Admission medications   Medication Sig Start Date End Date Taking? Authorizing Provider  amLODipine (NORVASC) 5 MG tablet Take 1 tablet (5 mg total) by mouth daily. 03/26/20 06/24/20 Yes Larissa Pegg, Ines Bloomer, MD  atovaquone-proguanil (MALARONE) 250-100 MG TABS Take 1 tablet by mouth daily. 05/16/14  Yes Roselee Culver, MD  blood glucose meter kit and supplies KIT Use up to four times daily as directed. Dispense based on pt & ins preference. Dx: E11.65, Z79.4 07/25/18  Yes Horald Pollen, MD  glucose blood [facility] VERIO) test strip Use 2x a day 06/02/15  Yes Daub, Loura Back, MD  losartan (COZAAR) 100 MG tablet Take 1 tablet (100 mg total) by mouth daily. 03/26/20 06/24/20 Yes Kynzlee Hucker, Ines Bloomer, MD  metFORMIN (GLUCOPHAGE) 1000 MG tablet TAKE 1 TABLET BY MOUTH 2 TIMES DAILY WITH A MEAL. 12/13/19  Yes [place], Ines Bloomer, MD  ONE TOUCH LANCETS MISC Use 2x a day 07/07/18  Yes Tauriel Scronce, Ines Bloomer, MD  rosuvastatin (CRESTOR) 10 MG tablet Take 1 tablet (10 mg total) by mouth daily. 12/13/19  Yes Orchid Glassberg, Ines Bloomer, MD  Semaglutide, 1 MG/DOSE, (OZEMPIC, 1 MG/DOSE,) 2 MG/1.5ML SOPN Inject 0.75 mLs (1 mg  total) into the skin once a week. 03/27/20  Yes Andrw Mcguirt, Ines Bloomer, MD  sildenafil (VIAGRA) 100 MG tablet Take 0.5-1 tablets (50-100 mg total) by mouth daily as needed for erectile dysfunction. 03/26/20  Yes Britta Louth, Ines Bloomer, MD  sitaGLIPtin (JANUVIA) 100 MG tablet Take 1 tablet (100 mg total) by mouth daily. 07/07/18 06/17/20 Yes RipleyInes Bloomer, MD    Allergies  Allergen Reactions   Dulaglutide Diarrhea   Food     pork   Penicillins     REACTION: swelling and itching    Patient Active Problem List   Diagnosis Date Noted   Dyslipidemia associated with type 2 diabetes mellitus ([place]) 03/26/2020   Abnormal EKG 10/04/2012   COLONIC POLYPS, ADENOMATOUS, BENIGN 04/21/2010   VENTRICULAR HYPERTROPHY, LEFT 03/26/2010   SLEEP APNEA 02/12/2010   GOUT, UNSPECIFIED 12/12/2009   Uncontrolled type 2 diabetes mellitus with hyperglycemia ([place]) 11/12/2009   SNORING 10/28/2009   DEPRESSION 06/13/2008   PTERYGIUM, BILATERAL 05/08/2007   Erectile dysfunction 05/08/2007   Hyperlipidemia 04/21/2007   Essential hypertension 04/21/2007    Past Medical History:  Diagnosis Date   Allergy    Diabetes mellitus without complication (HCC)    Diet controlled   HTN (hypertension)     Past Surgical History:  Procedure Laterality Date   None      Social History   Socioeconomic History   Marital status: Married  Spouse name: Not on file   Number of children: 4   Years of education: Not on file   Highest education level: Not on file  Occupational History    Employer: [employer]   Tobacco Use   Smoking status: Never Smoker   Smokeless tobacco: Never Used  Vaping Use   Vaping Use: Never used  Substance and Sexual Activity   Alcohol use: Yes    Comment: occasionally   Drug use: Never   Sexual activity: Not on file  Other Topics Concern   Not on file  Social History Narrative   Lives with wife.m     Social Determinants of Health   Financial  Resource Strain:    Difficulty of Paying Living Expenses: Not on file  Food Insecurity:    Worried About Charity fundraiser in the Last Year: Not on file   [employer] of Food in the Last Year: Not on file  Transportation Needs:    Lack of Transportation (Medical): Not on file   Lack of Transportation (Non-Medical): Not on file  Physical Activity:    Days of Exercise per Week: Not on file   Minutes of Exercise per Session: Not on file  Stress:    Feeling of Stress : Not on file  Social Connections:    Frequency of Communication with Friends and Family: Not on file   Frequency of Social Gatherings with Friends and Family: Not on file   Attends Religious Services: Not on file   Active Member of Clubs or Organizations: Not on file   Attends Archivist Meetings: Not on file   Marital Status: Not on file  Intimate Partner Violence:    Fear of Current or Ex-Partner: Not on file   Emotionally Abused: Not on file   Physically Abused: Not on file   Sexually Abused: Not on file    Family History  Problem Relation Age of Onset   Diabetes Mother    Hypertension Mother    Heart disease Father        CHF     Review of Systems  Constitutional: Negative.  Negative for chills and fever.  HENT: Negative.  Negative for congestion and sore throat.   Respiratory: Negative.  Negative for cough and shortness of breath.   Cardiovascular: Negative.  Negative for chest pain and palpitations.  Gastrointestinal: Negative.  Negative for abdominal pain, diarrhea, nausea and vomiting.  Genitourinary: Negative.  Negative for dysuria and hematuria.  Musculoskeletal: Negative.  Negative for back pain, myalgias and neck pain.  Skin: Negative.  Negative for rash.  Neurological: Negative.  Negative for dizziness and headaches.  All other systems reviewed and are negative.   Today's Vitals   06/17/20 0941 06/17/20 0952  BP: (!) 175/109 (!) 160/100  Pulse: (!) 102   Temp:  98.7 F (37.1 C)   TempSrc: Temporal   SpO2: 96%   Weight: 155 lb (70.3 kg)   Height: _0  (1.676 m)    Body mass index is 25.02 kg/m. Wt Readings from Last 3 Encounters:  06/17/20 155 lb (70.3 kg)  03/26/20 157 lb (71.2 kg)  12/13/19 151 lb 3.2 oz (68.6 kg)    Physical Exam Vitals reviewed.  Constitutional:      Appearance: Normal appearance.  HENT:     Head: Normocephalic.  Eyes:     Extraocular Movements: Extraocular movements intact.     Conjunctiva/sclera: Conjunctivae normal.     Pupils: Pupils are equal, round, and reactive  to light.  Cardiovascular:     Rate and Rhythm: Normal rate and regular rhythm.     Pulses: Normal pulses.     Heart sounds: Normal heart sounds.  Pulmonary:     Effort: Pulmonary effort is normal.     Breath sounds: Normal breath sounds.  Musculoskeletal:        General: Normal range of motion.     Cervical back: Normal range of motion and neck supple.  Skin:    General: Skin is warm and dry.     Capillary Refill: Capillary refill takes less than 2 seconds.  Neurological:     General: No focal deficit present.     Mental Status: He is alert and oriented to person, place, and time.  Psychiatric:        Mood and Affect: Mood normal.        Behavior: Behavior normal.     Results for orders placed or performed in visit on 06/17/20 (from the past 24 hour(s))  POCT glucose (manual entry)     Status: Abnormal   Collection Time: 06/17/20 10:36 AM  Result Value Ref Range   POC Glucose 161 (A) 70 - 99 mg/dl  POCT glycosylated hemoglobin (Hb A1C)     Status: Abnormal   Collection Time: 06/17/20 10:42 AM  Result Value Ref Range   Hemoglobin A1C 7.0 (A) 4.0 - 5.6 %   HbA1c POC (<> result, manual entry)     HbA1c, POC (prediabetic range)     HbA1c, POC (controlled diabetic range)     A total of 45 minutes was spent with the patient, greater than 50% of which was in counseling/coordination of care regarding uncontrolled hypertension and  diabetes and cardiovascular risks associated with these conditions, need to increase dose and change antihypertensive medication, review of all medications including diabetic drugs, education on nutrition, review of most recent office visit notes, review of most recent blood work results including today's hemoglobin A1c, health maintenance items, documentation, prognosis, and need for follow-up.  ASSESSMENT & PLAN: Hypertension associated with diabetes ([place]) Uncontrolled hypertension.  Will increase amlodipine to 10 mg daily and change losartan to losartan-HCTZ 100-12.5 mg daily. Much improved diabetes with hemoglobin A1c down to 7.0.  Continue present treatment with Metformin, Januvia, and Ozempic. Follow-up in 6 months.   [place] was seen today for medical management of chronic issues, hypertension and diabetes.  Diagnoses and all orders for this visit:  Uncontrolled hypertension  Need for immunization against influenza -     Flu Vaccine QUAD 36+ mos IM  Hypertension associated with diabetes ([place]) -     amLODipine (NORVASC) 10 MG tablet; Take 1 tablet (10 mg total) by mouth daily. -     losartan-hydrochlorothiazide (HYZAAR) 100-12.5 MG tablet; Take 1 tablet by mouth daily. -     Semaglutide, 1 MG/DOSE, (OZEMPIC, 1 MG/DOSE,) 2 MG/1.5ML SOPN; Inject 1 mg into the skin once a week.  Dyslipidemia associated with type 2 diabetes mellitus ([place])  Uncontrolled type 2 diabetes mellitus with hyperglycemia (HCC) -     POCT glucose (manual entry) -     POCT glycosylated hemoglobin (Hb A1C)    Patient Instructions       If you have lab work done today you will be contacted with your lab results within the next 2 weeks.  If you have not heard from [place] then please contact us. The fastest way to get your results is to register for My Chart.   IF you  received an x-ray today, you will receive an invoice from [facility]. Please contact [facility] at [phone] with questions  or concerns regarding your invoice.   IF you received labwork today, you will receive an invoice from [facility]. Please contact [facility] at [phone] with questions or concerns regarding your invoice.   Our billing staff will not be able to assist you with questions regarding bills from these companies.  You will be contacted with the lab results as soon as they are available. The fastest way to get your results is to activate your My Chart account. Instructions are located on the last page of this paperwork. If you have not heard from [place] regarding the results in 2 weeks, please contact this office.      Diabetes Mellitus and Nutrition, Adult When you have diabetes (diabetes mellitus), it is very important to have healthy eating habits because your blood sugar (glucose) levels are greatly affected by what you eat and drink. Eating healthy foods in the appropriate amounts, at about the same times every day, can help you:  Control your blood glucose.  Lower your risk of heart disease.  Improve your blood pressure.  Reach or maintain a healthy weight. Every person with diabetes is different, and each person has different needs for a meal plan. Your health care provider may recommend that you work with a diet and nutrition specialist (dietitian) to make a meal plan that is best for you. Your meal plan may vary depending on factors such as:  The calories you need.  The medicines you take.  Your weight.  Your blood glucose, blood pressure, and cholesterol levels.  Your activity level.  Other health conditions you have, such as heart or kidney disease. How do carbohydrates affect me? Carbohydrates, also called carbs, affect your blood glucose level more than any other type of food. Eating carbs naturally raises the amount of glucose in your blood. Carb counting is a method for keeping track of how many carbs you eat. Counting carbs is important to keep your blood glucose at a healthy  level, especially if you use insulin or take certain oral diabetes medicines. It is important to know how many carbs you can safely have in each meal. This is different for every person. Your dietitian can help you calculate how many carbs you should have at each meal and for each snack. Foods that contain carbs include:  Bread, cereal, rice, pasta, and crackers.  Potatoes and corn.  Peas, beans, and lentils.  Milk and yogurt.  Fruit and juice.  Desserts, such as cakes, cookies, ice cream, and candy. How does alcohol affect me? Alcohol can cause a sudden decrease in blood glucose (hypoglycemia), especially if you use insulin or take certain oral diabetes medicines. Hypoglycemia can be a life-threatening condition. Symptoms of hypoglycemia (sleepiness, dizziness, and confusion) are similar to symptoms of having too much alcohol. If your health care provider says that alcohol is safe for you, follow these guidelines:  Limit alcohol intake to no more than 1 drink per day for nonpregnant women and 2 drinks per day for men. One drink equals 12 oz of beer, 5 oz of wine, or 1 oz of hard liquor.  Do not drink on an empty stomach.  Keep yourself hydrated with water, diet soda, or unsweetened iced tea.  Keep in mind that regular soda, juice, and other mixers may contain a lot of sugar and must be counted as carbs. What are tips for following this plan?  Reading food labels  Start by checking the serving size on the "Nutrition Facts" label of packaged foods and drinks. The amount of calories, carbs, fats, and other nutrients listed on the label is based on one serving of the item. Many items contain more than one serving per package.  Check the total grams (g) of carbs in one serving. You can calculate the number of servings of carbs in one serving by dividing the total carbs by 15. For example, if a food has 30 g of total carbs, it would be equal to 2 servings of carbs.  Check the number of  grams (g) of saturated and trans fats in one serving. Choose foods that have low or no amount of these fats.  Check the number of milligrams (mg) of salt (sodium) in one serving. Most people should limit total sodium intake to less than 2,300 mg per day.  Always check the nutrition information of foods labeled as "low-fat" or "nonfat". These foods may be higher in added sugar or refined carbs and should be avoided.  Talk to your dietitian to identify your daily goals for nutrients listed on the label. Shopping  Avoid buying canned, premade, or processed foods. These foods tend to be high in fat, sodium, and added sugar.  Shop around the outside edge of the grocery store. This includes fresh fruits and vegetables, bulk grains, fresh meats, and fresh dairy. Cooking  Use low-heat cooking methods, such as baking, instead of high-heat cooking methods like deep frying.  Cook using healthy oils, such as olive, canola, or sunflower oil.  Avoid cooking with butter, cream, or high-fat meats. Meal planning  Eat meals and snacks regularly, preferably at the same times every day. Avoid going long periods of time without eating.  Eat foods high in fiber, such as fresh fruits, vegetables, beans, and whole grains. Talk to your dietitian about how many servings of carbs you can eat at each meal.  Eat 4-6 ounces (oz) of lean protein each day, such as lean meat, chicken, fish, eggs, or tofu. One oz of lean protein is equal to: ? 1 oz of meat, chicken, or fish. ? 1 egg. ?  cup of tofu.  Eat some foods each day that contain healthy fats, such as avocado, nuts, seeds, and fish. Lifestyle  Check your blood glucose regularly.  Exercise regularly as told by your health care provider. This may include: ? 150 minutes of moderate-intensity or vigorous-intensity exercise each week. This could be brisk walking, biking, or water aerobics. ? Stretching and doing strength exercises, such as yoga or  weightlifting, at least 2 times a week.  Take medicines as told by your health care provider.  Do not use any products that contain nicotine or tobacco, such as cigarettes and e-cigarettes. If you need help quitting, ask your health care provider.  Work with a Social worker or diabetes educator to identify strategies to manage stress and any emotional and social challenges. Questions to ask a health care provider  Do I need to meet with a diabetes educator?  Do I need to meet with a dietitian?  What number can I call if I have questions?  When are the best times to check my blood glucose? Where to find more information:  American Diabetes Association: diabetes.org  Academy of Nutrition and Dietetics: www.eatright.[employer] of Diabetes and Digestive and Kidney Diseases (NIH): [employer] Summary  A healthy meal plan will help you control your blood glucose and maintain a healthy  lifestyle.  Working with a diet and nutrition specialist (dietitian) can help you make a meal plan that is best for you.  Keep in mind that carbohydrates (carbs) and alcohol have immediate effects on your blood glucose levels. It is important to count carbs and to use alcohol carefully. This information is not intended to replace advice given to you by your health care provider. Make sure you discuss any questions you have with your health care provider. Document Revised: 07/23/2017 Document Reviewed: 09/14/2016 Elsevier Patient Education  2020 Elsevier Inc.     Agustina Caroli, MD [facility]

## 2020-06-19 MED FILL — ROSUVASTATIN CALCIUM 10 MG: 10 | 90 days supply | Qty: 90 | Fill #1

## 2020-06-19 MED FILL — SILDENAFIL CITRATE 100 MG T: 100 | 30 days supply | Qty: 10 | Fill #2

## 2020-08-10 MED FILL — FARXIGA 5 MG TABLET: 5 | 90 days supply | Qty: 90 | Fill #1

## 2020-08-10 MED FILL — OZEMPIC (1 MG/DOSE) 4 MG/3M: 4 | 56 days supply | Qty: 6 | Fill #1

## 2020-08-10 MED FILL — SILDENAFIL CITRATE 100 MG T: 100 | 30 days supply | Qty: 6 | Fill #1

## 2020-09-18 DIAGNOSIS — H35033 Hypertensive retinopathy, bilateral: Secondary | ICD-10-CM | POA: Diagnosis not present

## 2020-09-18 DIAGNOSIS — E113293 Type 2 diabetes mellitus with mild nonproliferative diabetic retinopathy without macular edema, bilateral: Secondary | ICD-10-CM | POA: Diagnosis not present

## 2020-09-18 DIAGNOSIS — H40013 Open angle with borderline findings, low risk, bilateral: Secondary | ICD-10-CM | POA: Diagnosis not present

## 2020-09-18 LAB — HM DIABETES EYE EXAM

## 2020-09-25 ENCOUNTER — Encounter: Payer: Self-pay | Admitting: *Deleted

## 2020-09-26 ENCOUNTER — Ambulatory Visit (INDEPENDENT_AMBULATORY_CARE_PROVIDER_SITE_OTHER): Payer: 59 | Admitting: Emergency Medicine

## 2020-09-26 ENCOUNTER — Other Ambulatory Visit: Payer: Self-pay

## 2020-09-26 DIAGNOSIS — Z23 Encounter for immunization: Secondary | ICD-10-CM

## 2020-09-26 NOTE — Patient Instructions (Signed)
° ° ° °  If you have lab work done today you will be contacted with your lab results within the next 2 weeks.  If you have not heard from us then please contact us. The fastest way to get your results is to register for My Chart. ° ° °IF you received an x-ray today, you will receive an invoice from [facility]. Please contact [facility] at [phone] with questions or concerns regarding your invoice.  ° °IF you received labwork today, you will receive an invoice from [facility]. Please contact [facility] at [phone] with questions or concerns regarding your invoice.  ° °Our billing staff will not be able to assist you with questions regarding bills from these companies. ° °You will be contacted with the lab results as soon as they are available. The fastest way to get your results is to activate your My Chart account. Instructions are located on the last page of this paperwork. If you have not heard from us regarding the results in 2 weeks, please contact this office. °  ° ° ° °

## 2020-09-26 NOTE — Progress Notes (Signed)
Pt here today for Tdap, last Td in 2009 pt is overdue. Pt travels frequently so this was documented in his file for travel, as well as a list of his current other immunizations.

## 2020-12-16 ENCOUNTER — Ambulatory Visit: Payer: Self-pay | Admitting: Emergency Medicine

## 2020-12-16 ENCOUNTER — Ambulatory Visit: Payer: 59 | Admitting: Emergency Medicine

## 2020-12-16 DIAGNOSIS — Z0289 Encounter for other administrative examinations: Secondary | ICD-10-CM

## 2021-01-22 ENCOUNTER — Other Ambulatory Visit (HOSPITAL_COMMUNITY): Payer: Self-pay

## 2021-01-22 ENCOUNTER — Other Ambulatory Visit: Payer: Self-pay | Admitting: Emergency Medicine

## 2021-01-22 DIAGNOSIS — E1165 Type 2 diabetes mellitus with hyperglycemia: Secondary | ICD-10-CM

## 2021-01-22 DIAGNOSIS — E785 Hyperlipidemia, unspecified: Secondary | ICD-10-CM

## 2021-01-22 MED ORDER — DAPAGLIFLOZIN PROPANEDIOL 5 MG PO TABS
ORAL_TABLET | ORAL | 3 refills | Status: AC
  Filled 2021-01-22: qty 90, 90d supply, fill #0
  Filled 2021-08-07: qty 90, 90d supply, fill #1
  Filled 2021-11-21: qty 90, 90d supply, fill #2

## 2021-01-22 MED ORDER — ROSUVASTATIN CALCIUM 10 MG PO TABS
ORAL_TABLET | Freq: Every day | ORAL | 3 refills | Status: DC
  Filled 2021-01-22: qty 90, 90d supply, fill #0

## 2021-01-22 MED FILL — Semaglutide Soln Pen-inj 1 MG/DOSE (4 MG/3ML): SUBCUTANEOUS | 84 days supply | Qty: 9 | Fill #0 | Status: AC

## 2021-01-22 MED FILL — Amlodipine Besylate Tab 10 MG (Base Equivalent): ORAL | 90 days supply | Qty: 90 | Fill #0 | Status: AC

## 2021-01-22 MED FILL — Losartan Potassium & Hydrochlorothiazide Tab 100-12.5 MG: ORAL | 90 days supply | Qty: 90 | Fill #0 | Status: AC

## 2021-01-22 MED FILL — Sildenafil Citrate Tab 100 MG: ORAL | 30 days supply | Qty: 5 | Fill #0 | Status: AC

## 2021-02-08 ENCOUNTER — Other Ambulatory Visit (HOSPITAL_COMMUNITY): Payer: Self-pay

## 2021-02-14 ENCOUNTER — Other Ambulatory Visit (HOSPITAL_COMMUNITY): Payer: Self-pay

## 2021-02-21 DIAGNOSIS — E113392 Type 2 diabetes mellitus with moderate nonproliferative diabetic retinopathy without macular edema, left eye: Secondary | ICD-10-CM | POA: Diagnosis not present

## 2021-02-21 DIAGNOSIS — H40013 Open angle with borderline findings, low risk, bilateral: Secondary | ICD-10-CM | POA: Diagnosis not present

## 2021-02-21 DIAGNOSIS — E113311 Type 2 diabetes mellitus with moderate nonproliferative diabetic retinopathy with macular edema, right eye: Secondary | ICD-10-CM | POA: Diagnosis not present

## 2021-02-21 DIAGNOSIS — H35033 Hypertensive retinopathy, bilateral: Secondary | ICD-10-CM | POA: Diagnosis not present

## 2021-02-25 ENCOUNTER — Other Ambulatory Visit: Payer: Self-pay | Admitting: Emergency Medicine

## 2021-02-25 ENCOUNTER — Other Ambulatory Visit (HOSPITAL_COMMUNITY): Payer: Self-pay

## 2021-02-25 DIAGNOSIS — E1165 Type 2 diabetes mellitus with hyperglycemia: Secondary | ICD-10-CM

## 2021-02-25 MED ORDER — METFORMIN HCL 1000 MG PO TABS
ORAL_TABLET | ORAL | 3 refills | Status: DC
  Filled 2021-02-25 – 2021-05-16 (×2): qty 180, 90d supply, fill #0

## 2021-03-04 ENCOUNTER — Other Ambulatory Visit (HOSPITAL_COMMUNITY): Payer: Self-pay

## 2021-03-05 ENCOUNTER — Other Ambulatory Visit (HOSPITAL_COMMUNITY): Payer: Self-pay

## 2021-03-13 ENCOUNTER — Ambulatory Visit: Payer: 59 | Admitting: Internal Medicine

## 2021-03-13 ENCOUNTER — Encounter: Payer: Self-pay | Admitting: Internal Medicine

## 2021-03-13 ENCOUNTER — Other Ambulatory Visit: Payer: Self-pay

## 2021-03-13 VITALS — BP 152/92 | HR 104 | Resp 16 | Ht 66.0 in | Wt 170.0 lb

## 2021-03-13 DIAGNOSIS — E1165 Type 2 diabetes mellitus with hyperglycemia: Secondary | ICD-10-CM

## 2021-03-13 DIAGNOSIS — Z794 Long term (current) use of insulin: Secondary | ICD-10-CM | POA: Diagnosis not present

## 2021-03-13 DIAGNOSIS — Z111 Encounter for screening for respiratory tuberculosis: Secondary | ICD-10-CM | POA: Diagnosis not present

## 2021-03-13 NOTE — Progress Notes (Signed)
    Subjective:    Patient ID: Danny Vazquez, male   DOB: 1961/03/21, 60 y.o.   MRN: [id number]   HPI  Here for TB screening.  No symptoms.  History of BCG and positive PPD in 2000 with clear CXR and received treatment for latent TB.   Rifampin for 6 months.  He has a card showing the completed treatment at home with passport.  Has had 2 CXR with normal results in 2013 and 2018 since.  No known exposure to someone with active TB.    Establishing care in October for DM and hypertension.  Not clear if he was taking Januvia regularly--currently states taking Semaglutide.  Rest of meds with refills.   Current Meds  Medication Sig   amLODipine (NORVASC) 10 MG tablet TAKE 1 TABLET BY MOUTH DAILY.   dapagliflozin propanediol (FARXIGA) 5 MG TABS tablet TAKE 1 TABLET BY MOUTH ONCE A DAY BEFORE BREAKFAST   folic acid (FOLVITE) 800 MCG tablet Take 400 mcg by mouth 2 (two) times daily.   losartan-hydrochlorothiazide (HYZAAR) 100-12.5 MG tablet TAKE 1 TABLET BY MOUTH DAILY.   metFORMIN (GLUCOPHAGE) 1000 MG tablet TAKE 1 TABLET BY MOUTH 2 TIMES DAILY WITH A MEAL.   rosuvastatin (CRESTOR) 10 MG tablet TAKE 1 TABLET BY MOUTH ONCE A DAY   Semaglutide, 1 MG/DOSE, (OZEMPIC, 1 MG/DOSE,) 2 MG/1.5ML SOPN Inject 1 mg into the skin once a week.   sildenafil (VIAGRA) 100 MG tablet TAKE 1/2 TO 1 TABLET (50 TO 100 MG TOTAL) BY MOUTH DAILY AS NEEDED FOR ERECTILE DYSFUNCTION.   Zinc 50 MG CAPS Take 50 mg by mouth 2 (two) times daily as needed.   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  Has documentation of treatment for latent TB in 2000 and no known exposure since.  2 CXRS normal since as well.  Discussed he should check with health systems he will be working in as to whether they have different rules for screening, but information we have is that he does not need screening test unless known exposure.

## 2021-03-17 DIAGNOSIS — H35033 Hypertensive retinopathy, bilateral: Secondary | ICD-10-CM | POA: Diagnosis not present

## 2021-03-17 DIAGNOSIS — H3582 Retinal ischemia: Secondary | ICD-10-CM | POA: Diagnosis not present

## 2021-03-17 DIAGNOSIS — E113392 Type 2 diabetes mellitus with moderate nonproliferative diabetic retinopathy without macular edema, left eye: Secondary | ICD-10-CM | POA: Diagnosis not present

## 2021-03-17 DIAGNOSIS — E113311 Type 2 diabetes mellitus with moderate nonproliferative diabetic retinopathy with macular edema, right eye: Secondary | ICD-10-CM | POA: Diagnosis not present

## 2021-04-02 DIAGNOSIS — E113311 Type 2 diabetes mellitus with moderate nonproliferative diabetic retinopathy with macular edema, right eye: Secondary | ICD-10-CM | POA: Diagnosis not present

## 2021-05-08 DIAGNOSIS — H3563 Retinal hemorrhage, bilateral: Secondary | ICD-10-CM | POA: Diagnosis not present

## 2021-05-08 DIAGNOSIS — E113311 Type 2 diabetes mellitus with moderate nonproliferative diabetic retinopathy with macular edema, right eye: Secondary | ICD-10-CM | POA: Diagnosis not present

## 2021-05-08 DIAGNOSIS — H35033 Hypertensive retinopathy, bilateral: Secondary | ICD-10-CM | POA: Diagnosis not present

## 2021-05-08 DIAGNOSIS — E113392 Type 2 diabetes mellitus with moderate nonproliferative diabetic retinopathy without macular edema, left eye: Secondary | ICD-10-CM | POA: Diagnosis not present

## 2021-05-16 ENCOUNTER — Other Ambulatory Visit (HOSPITAL_COMMUNITY): Payer: Self-pay

## 2021-05-16 MED FILL — Semaglutide Soln Pen-inj 1 MG/DOSE (4 MG/3ML): SUBCUTANEOUS | 84 days supply | Qty: 9 | Fill #1 | Status: AC

## 2021-05-27 ENCOUNTER — Encounter: Payer: Self-pay | Admitting: Internal Medicine

## 2021-05-27 ENCOUNTER — Other Ambulatory Visit: Payer: Self-pay

## 2021-05-27 ENCOUNTER — Other Ambulatory Visit (HOSPITAL_COMMUNITY): Payer: Self-pay

## 2021-05-27 ENCOUNTER — Ambulatory Visit: Payer: BC Managed Care – PPO | Admitting: Internal Medicine

## 2021-05-27 VITALS — BP 148/104 | HR 88 | Resp 16 | Ht 65.75 in | Wt 170.0 lb

## 2021-05-27 DIAGNOSIS — Z23 Encounter for immunization: Secondary | ICD-10-CM

## 2021-05-27 DIAGNOSIS — I10 Essential (primary) hypertension: Secondary | ICD-10-CM

## 2021-05-27 DIAGNOSIS — Z79899 Other long term (current) drug therapy: Secondary | ICD-10-CM

## 2021-05-27 DIAGNOSIS — Z125 Encounter for screening for malignant neoplasm of prostate: Secondary | ICD-10-CM

## 2021-05-27 DIAGNOSIS — E1165 Type 2 diabetes mellitus with hyperglycemia: Secondary | ICD-10-CM | POA: Diagnosis not present

## 2021-05-27 DIAGNOSIS — E785 Hyperlipidemia, unspecified: Secondary | ICD-10-CM | POA: Diagnosis not present

## 2021-05-27 DIAGNOSIS — J302 Other seasonal allergic rhinitis: Secondary | ICD-10-CM | POA: Insufficient documentation

## 2021-05-27 DIAGNOSIS — Z794 Long term (current) use of insulin: Secondary | ICD-10-CM | POA: Diagnosis not present

## 2021-05-27 MED ORDER — CARVEDILOL PHOSPHATE ER 10 MG PO CP24
10.0000 mg | ORAL_CAPSULE | Freq: Every day | ORAL | 11 refills | Status: DC
  Filled 2021-05-27: qty 30, 30d supply, fill #0
  Filled 2021-08-07: qty 30, 30d supply, fill #1
  Filled 2021-09-29: qty 30, 30d supply, fill #2
  Filled 2021-11-21: qty 30, 30d supply, fill #3
  Filled 2021-12-26 – 2022-01-20 (×2): qty 30, 30d supply, fill #4

## 2021-05-27 MED ORDER — METFORMIN HCL ER 500 MG PO TB24
ORAL_TABLET | ORAL | 3 refills | Status: DC
  Filled 2021-05-27: qty 360, 90d supply, fill #0

## 2021-05-27 MED ORDER — TADALAFIL 10 MG PO TABS
10.0000 mg | ORAL_TABLET | Freq: Every day | ORAL | 11 refills | Status: DC | PRN
  Filled 2021-05-27: qty 10, 30d supply, fill #0
  Filled 2021-06-20: qty 10, 30d supply, fill #1
  Filled 2021-08-07: qty 10, 30d supply, fill #2
  Filled 2021-09-29: qty 30, 30d supply, fill #3
  Filled 2021-11-21: qty 30, 30d supply, fill #4
  Filled 2021-12-26 – 2022-01-20 (×2): qty 30, 30d supply, fill #5

## 2021-05-27 MED ORDER — MOMETASONE FUROATE 50 MCG/ACT NA SUSP
2.0000 | Freq: Every day | NASAL | 12 refills | Status: DC
  Filled 2021-05-27 (×2): qty 17, 30d supply, fill #0

## 2021-05-27 NOTE — Patient Instructions (Signed)
TB

## 2021-05-27 NOTE — Progress Notes (Signed)
Subjective:    Patient ID: Danny Vazquez, male   DOB: 1961/04/06, 60 y.o.   MRN: [id number]   HPI  Here to establish   Hypertension:  Diagnosed about 10 years or so ago.  Occasionally controlled.  He is now back to exercising.  [place]  Has lost about 50 lbs over 2017-2020.  These things certainly improved his bp.  [facility].  Does occasionally miss meds. Misses meds once weekly.    2.  DM:  Last A1C 03/2020 was 9.3%.  He does not check sugars as he gets down with the high results.   He does have diabetic retinopathy, apparently in his right eye.  Has been to retinal specialist for and injection with good results. Mild microalbuminuria in 11/2019.   Taking Farxiga, Metformin--misses evening dose regularly.  Also Ozempic.   He thinks he was on Glipizide, but cannot remember why removed.    3.  Hyperlipidemia:  tolerated Rosuvastatin fine.  LDL not quite at goal at 90 03/2020.    4.  HM:  has had shingles vaccine maybe 3 years ago--before COVID.  Has not received a second pfizer booster.    5.  ED:  Taking 200 mg of Viagra for adequate function.  Would be willing to try Cialis  He is fasting today.      Current Meds  Medication Sig   amLODipine (NORVASC) 10 MG tablet TAKE 1 TABLET BY MOUTH DAILY.   dapagliflozin propanediol (FARXIGA) 5 MG TABS tablet TAKE 1 TABLET BY MOUTH ONCE A DAY BEFORE BREAKFAST   folic acid (FOLVITE) 800 MCG tablet Take 400 mcg by mouth 2 (two) times daily.   losartan-hydrochlorothiazide (HYZAAR) 100-12.5 MG tablet TAKE 1 TABLET BY MOUTH DAILY.   metFORMIN (GLUCOPHAGE) 1000 MG tablet TAKE 1 TABLET BY MOUTH 2 TIMES DAILY WITH A MEAL.   rosuvastatin (CRESTOR) 10 MG tablet TAKE 1 TABLET BY MOUTH ONCE A DAY   Semaglutide, 1 MG/DOSE, 4 MG/3ML SOPN INJECT 1 MG INTO THE SKIN ONCE A WEEK   sildenafil (VIAGRA) 100 MG tablet Take 100 mg by mouth daily as needed for erectile dysfunction.   Zinc 50 MG CAPS  Take 50 mg by mouth 2 (two) times daily as needed.   [DISCONTINUED] Semaglutide, 1 MG/DOSE, (OZEMPIC, 1 MG/DOSE,) 2 MG/1.5ML SOPN Inject 1 mg into the skin once a week.   Allergies  Allergen Reactions   Food Rash    pork   Dulaglutide Diarrhea   Penicillins     REACTION: swelling and itching     Review of Systems    Objective:   BP (!) 148/104 (BP Location: Left Arm, Patient Position: Sitting, Cuff Size: Normal)   Pulse 88   Resp 16   Ht 5' 5.75" (1.67 m)   Wt 170 lb (77.1 kg)   BMI 27.65 kg/m   Physical Exam NAD HEENT:  PERRL, EOMI, TMs pearly gray, throat without injection Neck:  Supple.  No adenopathy, no thyromegaly Chest:  CTA CV:  RRR with normal S1 and S2, No S3, S4 or murmur.  No carotid bruit.  Carotid. Radial and DPpulses normal and equal Abd:  S, NT, No HSM or mass, + BS LE:  No edema.   Assessment & Plan  1  DM:  A1C and urine microalbumin/crea. As missing evening Metformin regularly, change to single morning dose and see if tolerated.  Metformin XR 2000 mg in morning.  Continue Farxiga and Semaglutide.  2.  Hypertension:  not controlled. CMP.  Continue Losartan/HCTZ, amlodipine and add once daily Carvedilol 10 mg.  BP and pulse check in 2 weeks. CBC, CmP  3. Hyperlipidemia:  continue statin.  FLP  4.  ED:  cialis  5.  HM:  PSA, COVID bivalent vaccine.

## 2021-05-28 ENCOUNTER — Other Ambulatory Visit (HOSPITAL_COMMUNITY): Payer: Self-pay

## 2021-05-28 LAB — COMPREHENSIVE METABOLIC PANEL
ALT: 19 IU/L (ref 0–44)
AST: 25 IU/L (ref 0–40)
Albumin/Globulin Ratio: 1.6 (ref 1.2–2.2)
Albumin: 4.5 g/dL (ref 3.8–4.8)
Alkaline Phosphatase: 89 IU/L (ref 44–121)
BUN/Creatinine Ratio: 16 (ref 10–24)
BUN: 16 mg/dL (ref 8–27)
Bilirubin Total: 0.6 mg/dL (ref 0.0–1.2)
CO2: 22 mmol/L (ref 20–29)
Calcium: 10.3 mg/dL — ABNORMAL HIGH (ref 8.6–10.2)
Chloride: 97 mmol/L (ref 96–106)
Creatinine, Ser: 1.03 mg/dL (ref 0.76–1.27)
Globulin, Total: 2.8 g/dL (ref 1.5–4.5)
Glucose: 133 mg/dL — ABNORMAL HIGH (ref 70–99)
Potassium: 4.2 mmol/L (ref 3.5–5.2)
Sodium: 138 mmol/L (ref 134–144)
Total Protein: 7.3 g/dL (ref 6.0–8.5)
eGFR: 83 mL/min/{1.73_m2} (ref >59)

## 2021-05-28 LAB — CBC WITH DIFFERENTIAL/PLATELET
Basophils Absolute: 0.1 10*3/uL (ref 0.0–0.2)
Basos: 1 %
EOS (ABSOLUTE): 0.2 10*3/uL (ref 0.0–0.4)
Eos: 4 %
Hematocrit: 47.9 % (ref 37.5–51.0)
Hemoglobin: 17 g/dL (ref 13.0–17.7)
Immature Grans (Abs): 0 10*3/uL (ref 0.0–0.1)
Immature Granulocytes: 0 %
Lymphocytes Absolute: 2.2 10*3/uL (ref 0.7–3.1)
Lymphs: 48 %
MCH: 30.7 pg (ref 26.6–33.0)
MCHC: 35.5 g/dL (ref 31.5–35.7)
MCV: 87 fL (ref 79–97)
Monocytes Absolute: 0.3 10*3/uL (ref 0.1–0.9)
Monocytes: 7 %
Neutrophils Absolute: 1.8 10*3/uL (ref 1.4–7.0)
Neutrophils: 40 %
Platelets: 213 10*3/uL (ref 150–450)
RBC: 5.53 x10E6/uL (ref 4.14–5.80)
RDW: 12.6 % (ref 11.6–15.4)
WBC: 4.6 10*3/uL (ref 3.4–10.8)

## 2021-05-28 LAB — LIPID PANEL W/O CHOL/HDL RATIO
Cholesterol, Total: 157 mg/dL (ref 100–199)
HDL: 51 mg/dL (ref >39)
LDL Chol Calc (NIH): 87 mg/dL (ref 0–99)
Triglycerides: 104 mg/dL (ref 0–149)
VLDL Cholesterol Cal: 19 mg/dL (ref 5–40)

## 2021-05-28 LAB — HGB A1C W/O EAG: Hgb A1c MFr Bld: 8.3 % — ABNORMAL HIGH (ref 4.8–5.6)

## 2021-05-28 LAB — MICROALBUMIN / CREATININE URINE RATIO
Creatinine, Urine: 46.2 mg/dL
Microalb/Creat Ratio: 19 mg/g creat (ref 0–29)
Microalbumin, Urine: 8.6 ug/mL

## 2021-05-28 LAB — PSA: Prostate Specific Ag, Serum: 0.4 ng/mL (ref 0.0–4.0)

## 2021-06-02 ENCOUNTER — Other Ambulatory Visit (HOSPITAL_COMMUNITY): Payer: Self-pay

## 2021-06-06 ENCOUNTER — Ambulatory Visit: Payer: Self-pay | Admitting: Internal Medicine

## 2021-06-11 ENCOUNTER — Other Ambulatory Visit: Payer: BC Managed Care – PPO

## 2021-06-18 ENCOUNTER — Other Ambulatory Visit: Payer: BC Managed Care – PPO

## 2021-06-20 ENCOUNTER — Other Ambulatory Visit (HOSPITAL_COMMUNITY): Payer: Self-pay

## 2021-06-20 ENCOUNTER — Other Ambulatory Visit: Payer: Self-pay | Admitting: Internal Medicine

## 2021-06-23 ENCOUNTER — Other Ambulatory Visit (HOSPITAL_COMMUNITY): Payer: Self-pay

## 2021-06-24 ENCOUNTER — Other Ambulatory Visit: Payer: Self-pay | Admitting: Internal Medicine

## 2021-06-24 ENCOUNTER — Other Ambulatory Visit (HOSPITAL_COMMUNITY): Payer: Self-pay

## 2021-06-26 ENCOUNTER — Other Ambulatory Visit (HOSPITAL_COMMUNITY): Payer: Self-pay

## 2021-06-26 ENCOUNTER — Other Ambulatory Visit: Payer: Self-pay | Admitting: Internal Medicine

## 2021-06-26 ENCOUNTER — Telehealth: Payer: Self-pay

## 2021-06-26 NOTE — Telephone Encounter (Signed)
Pt called to check in to get ozempic medication request approved. Request was sent by the pharmacy

## 2021-06-27 ENCOUNTER — Other Ambulatory Visit: Payer: Self-pay | Admitting: Internal Medicine

## 2021-06-27 ENCOUNTER — Other Ambulatory Visit (HOSPITAL_COMMUNITY): Payer: Self-pay

## 2021-06-27 MED ORDER — OZEMPIC (1 MG/DOSE) 4 MG/3ML ~~LOC~~ SOPN
1.0000 mg | PEN_INJECTOR | SUBCUTANEOUS | 8 refills | Status: DC
  Filled 2021-06-27: qty 3, 28d supply, fill #0
  Filled 2021-10-07 – 2021-11-21 (×2): qty 3, 28d supply, fill #1
  Filled 2021-12-26: qty 9, 84d supply, fill #2
  Filled 2022-04-30: qty 9, 84d supply, fill #3

## 2021-06-27 NOTE — Telephone Encounter (Signed)
Rx sent and pt notified.

## 2021-07-20 ENCOUNTER — Other Ambulatory Visit: Payer: Self-pay | Admitting: Internal Medicine

## 2021-07-20 DIAGNOSIS — E785 Hyperlipidemia, unspecified: Secondary | ICD-10-CM

## 2021-07-20 MED ORDER — ROSUVASTATIN CALCIUM 20 MG PO TABS
ORAL_TABLET | ORAL | 3 refills | Status: DC
  Filled 2021-07-20 – 2021-08-07 (×2): qty 90, 90d supply, fill #0
  Filled 2021-11-21: qty 90, 90d supply, fill #1
  Filled 2022-07-08: qty 90, 90d supply, fill #2

## 2021-07-21 ENCOUNTER — Other Ambulatory Visit (HOSPITAL_COMMUNITY): Payer: Self-pay

## 2021-07-29 ENCOUNTER — Other Ambulatory Visit (HOSPITAL_COMMUNITY): Payer: Self-pay

## 2021-07-30 ENCOUNTER — Ambulatory Visit: Payer: BC Managed Care – PPO | Admitting: Internal Medicine

## 2021-08-07 ENCOUNTER — Other Ambulatory Visit: Payer: Self-pay | Admitting: Emergency Medicine

## 2021-08-07 ENCOUNTER — Other Ambulatory Visit (HOSPITAL_COMMUNITY): Payer: Self-pay

## 2021-08-07 DIAGNOSIS — E1159 Type 2 diabetes mellitus with other circulatory complications: Secondary | ICD-10-CM

## 2021-08-07 MED ORDER — LOSARTAN POTASSIUM-HCTZ 100-12.5 MG PO TABS
1.0000 | ORAL_TABLET | Freq: Every day | ORAL | 3 refills | Status: DC
  Filled 2021-08-07: qty 90, 90d supply, fill #0
  Filled 2021-11-21: qty 90, 90d supply, fill #1
  Filled 2022-07-08: qty 90, 90d supply, fill #2

## 2021-08-08 ENCOUNTER — Other Ambulatory Visit (HOSPITAL_COMMUNITY): Payer: Self-pay

## 2021-08-28 DIAGNOSIS — H35033 Hypertensive retinopathy, bilateral: Secondary | ICD-10-CM | POA: Diagnosis not present

## 2021-08-28 DIAGNOSIS — H40013 Open angle with borderline findings, low risk, bilateral: Secondary | ICD-10-CM | POA: Diagnosis not present

## 2021-08-28 DIAGNOSIS — H25813 Combined forms of age-related cataract, bilateral: Secondary | ICD-10-CM | POA: Diagnosis not present

## 2021-08-28 DIAGNOSIS — E113311 Type 2 diabetes mellitus with moderate nonproliferative diabetic retinopathy with macular edema, right eye: Secondary | ICD-10-CM | POA: Diagnosis not present

## 2021-08-28 LAB — HM DIABETES EYE EXAM

## 2021-09-02 ENCOUNTER — Other Ambulatory Visit: Payer: BC Managed Care – PPO

## 2021-09-04 DIAGNOSIS — H3563 Retinal hemorrhage, bilateral: Secondary | ICD-10-CM | POA: Diagnosis not present

## 2021-09-04 DIAGNOSIS — E113392 Type 2 diabetes mellitus with moderate nonproliferative diabetic retinopathy without macular edema, left eye: Secondary | ICD-10-CM | POA: Diagnosis not present

## 2021-09-04 DIAGNOSIS — H35033 Hypertensive retinopathy, bilateral: Secondary | ICD-10-CM | POA: Diagnosis not present

## 2021-09-04 DIAGNOSIS — E113311 Type 2 diabetes mellitus with moderate nonproliferative diabetic retinopathy with macular edema, right eye: Secondary | ICD-10-CM | POA: Diagnosis not present

## 2021-09-29 ENCOUNTER — Other Ambulatory Visit (HOSPITAL_COMMUNITY): Payer: Self-pay

## 2021-09-30 ENCOUNTER — Other Ambulatory Visit (HOSPITAL_COMMUNITY): Payer: Self-pay

## 2021-10-01 ENCOUNTER — Encounter: Payer: BC Managed Care – PPO | Admitting: Internal Medicine

## 2021-10-01 ENCOUNTER — Other Ambulatory Visit (HOSPITAL_COMMUNITY): Payer: Self-pay

## 2021-10-03 ENCOUNTER — Encounter: Payer: Self-pay | Admitting: Emergency Medicine

## 2021-10-07 ENCOUNTER — Other Ambulatory Visit (HOSPITAL_COMMUNITY): Payer: Self-pay

## 2021-10-15 ENCOUNTER — Other Ambulatory Visit (HOSPITAL_COMMUNITY): Payer: Self-pay

## 2021-11-21 ENCOUNTER — Other Ambulatory Visit (HOSPITAL_COMMUNITY): Payer: Self-pay

## 2021-11-24 ENCOUNTER — Ambulatory Visit (INDEPENDENT_AMBULATORY_CARE_PROVIDER_SITE_OTHER): Payer: BC Managed Care – PPO | Admitting: Internal Medicine

## 2021-11-24 ENCOUNTER — Encounter: Payer: Self-pay | Admitting: Internal Medicine

## 2021-11-24 ENCOUNTER — Other Ambulatory Visit (HOSPITAL_COMMUNITY): Payer: Self-pay

## 2021-11-24 VITALS — BP 150/110 | HR 96 | Resp 16 | Ht 66.0 in | Wt 170.0 lb

## 2021-11-24 DIAGNOSIS — Z0000 Encounter for general adult medical examination without abnormal findings: Secondary | ICD-10-CM | POA: Diagnosis not present

## 2021-11-24 DIAGNOSIS — J302 Other seasonal allergic rhinitis: Secondary | ICD-10-CM

## 2021-11-24 DIAGNOSIS — D126 Benign neoplasm of colon, unspecified: Secondary | ICD-10-CM

## 2021-11-24 DIAGNOSIS — Z79899 Other long term (current) drug therapy: Secondary | ICD-10-CM

## 2021-11-24 DIAGNOSIS — E785 Hyperlipidemia, unspecified: Secondary | ICD-10-CM | POA: Diagnosis not present

## 2021-11-24 DIAGNOSIS — I152 Hypertension secondary to endocrine disorders: Secondary | ICD-10-CM

## 2021-11-24 DIAGNOSIS — L309 Dermatitis, unspecified: Secondary | ICD-10-CM

## 2021-11-24 DIAGNOSIS — E1159 Type 2 diabetes mellitus with other circulatory complications: Secondary | ICD-10-CM

## 2021-11-24 DIAGNOSIS — E1165 Type 2 diabetes mellitus with hyperglycemia: Secondary | ICD-10-CM | POA: Diagnosis not present

## 2021-11-24 DIAGNOSIS — E11319 Type 2 diabetes mellitus with unspecified diabetic retinopathy without macular edema: Secondary | ICD-10-CM

## 2021-11-24 MED ORDER — CLOBETASOL PROPIONATE 0.05 % EX CREA
1.0000 "application " | TOPICAL_CREAM | Freq: Two times a day (BID) | CUTANEOUS | 0 refills | Status: AC
  Filled 2021-11-24: qty 60, 30d supply, fill #0

## 2021-11-24 MED ORDER — OLOPATADINE HCL 0.2 % OP SOLN
OPHTHALMIC | 11 refills | Status: DC
  Filled 2021-11-24: qty 2.5, 25d supply, fill #0

## 2021-11-24 MED ORDER — LORATADINE 10 MG PO TABS
10.0000 mg | ORAL_TABLET | Freq: Every day | ORAL | 11 refills | Status: DC
  Filled 2021-11-24: qty 30, 30d supply, fill #0

## 2021-11-24 MED ORDER — MONTELUKAST SODIUM 10 MG PO TABS
10.0000 mg | ORAL_TABLET | Freq: Every day | ORAL | 11 refills | Status: DC
  Filled 2021-11-24: qty 30, 30d supply, fill #0
  Filled 2022-07-08: qty 30, 30d supply, fill #1

## 2021-11-24 MED ORDER — AMLODIPINE BESYLATE 10 MG PO TABS
10.0000 mg | ORAL_TABLET | Freq: Every day | ORAL | 3 refills | Status: DC
  Filled 2021-11-24: qty 90, 90d supply, fill #0
  Filled 2022-07-08: qty 90, 90d supply, fill #1
  Filled 2022-09-28: qty 90, 90d supply, fill #2

## 2021-11-24 NOTE — Progress Notes (Signed)
? ? ?Subjective:  ?  ?Patient ID: Danny Vazquez, male   DOB: 19-Oct-1960, 61 y.o.   MRN: [id number] ? ? ?HPI ? ?Here for Male CPE: ? ?1.  STE:  Does perform.  No findings.  No family history of testicular cancer.   ? ?2.  PSA:  Last 05/2021 and 0.4 (normal range)  No family history of prostate cancer.   ? ?3.  Guaiac Cards/FIT:    Not clear if ever performed. ? ?4.  Colonoscopy:  2011 [place], Dr. Sharlett Iles with 2 adenomas without dysplasia.   ? ?5.  Cholesterol/Glucose:  LDL (87)and A1C (8.3%)(not at goal 05/2021)  He feels he could do better with physical activity.   ? ?6.  Immunizations:  Feels certain he had shingles vaccine in 2013 or 2014.   ?Immunization History  ?Administered Date(s) Administered  ? Influenza Whole 05/29/2009, 06/13/2010  ? Influenza,inj,Quad PF,6+ Mos 05/16/2014, 05/08/2015, 05/05/2016, 05/04/2017, 07/07/2018, 05/29/2019, 06/17/2020  ? PFIZER(Purple Top)SARS-COV-2 Vaccination 10/24/2019, 11/15/2019  ? Pension scheme manager 82yr & up 05/27/2021  ? Pneumococcal Polysaccharide-23 07/11/2009  ? Td 08/29/2007  ? Tdap 09/26/2020  ? ? ? ?Current Meds  ?Medication Sig  ? carvedilol (COREG CR) 10 MG 24 hr capsule Take 1 capsule by mouth daily.  ? dapagliflozin propanediol (FARXIGA) 5 MG TABS tablet TAKE 1 TABLET BY MOUTH ONCE A DAY BEFORE BREAKFAST  ? folic acid (FOLVITE) 8856MCG tablet Take 400 mcg by mouth 2 (two) times daily.  ? losartan-hydrochlorothiazide (HYZAAR) 100-12.5 MG tablet TAKE 1 TABLET BY MOUTH DAILY.  ? metFORMIN (GLUCOPHAGE-XR) 500 MG 24 hr tablet Take 4 tablets by mouth with morning meal daily. (Patient taking differently: 1,000 mg. Patient is taking 1 tab twice daily)  ? rosuvastatin (CRESTOR) 20 MG tablet Take 1 tablet by mouth daily.  ? Semaglutide, 1 MG/DOSE, (OZEMPIC, 1 MG/DOSE,) 4 MG/3ML SOPN INJECT 1 MG INTO THE SKIN ONCE A WEEK  ? tadalafil (CIALIS) 10 MG tablet Take 1 tablet by mouth daily as needed for erectile dysfunction.  ? Zinc 50 MG CAPS Take 50 mg  by mouth 2 (two) times daily as needed.  ? ?Allergies  ?Allergen Reactions  ? Food Rash  ?  pork  ? Dulaglutide Diarrhea  ? Penicillins   ?  REACTION: swelling and itching  ? ?Past Medical History:  ?Diagnosis Date  ? Diabetes mellitus without complication ([place]   ? HTN (hypertension)   ? Hyperlipidemia   ? Seasonal allergies   ? ? ?Past Surgical History:  ?Procedure Laterality Date  ? None    ? ?Family History  ?Problem Relation Age of Onset  ? Diabetes Mother   ? Hypertension Mother   ? Hypertension Father   ? Heart disease Father   ?     CHF  ? Cancer Brother   ?     Does not have all the info, but some sort of intestinal cancer.  ? Hypertension Brother   ? Diabetes Brother   ? Diabetes Brother   ? Hypertension Brother   ? Hypertension Brother   ? Diabetes Brother   ? Heart disease Brother   ?     CHF  ? Hypertension Brother   ? Diabetes Brother   ? Diabetes Brother   ? Hypertension Brother   ? Hypertension Brother   ? Diabetes Brother   ? Diabetes Brother   ? Hypertension Brother   ? Hypertension Brother   ? Diabetes Brother   ? ?Social History  ? ?  Socioeconomic History  ? Marital status: Significant Other  ?  Spouse name: Not on file  ? Number of children: 4  ? Years of education: Not on file  ? Highest education level: Master's degree (e.g., MA, MS, MEng, MEd, MSW, MBA)  ?Occupational History  ?  Employer: [employer]  ? Occupation: [employer]  ?  Comment: Water engineer, interpreter  ?Tobacco Use  ? Smoking status: Never  ?  Passive exposure: Never  ? Smokeless tobacco: Never  ?Vaping Use  ? Vaping Use: Never used  ?Substance and Sexual Activity  ? Alcohol use: Yes  ?  Comment: occasionally  ? Drug use: Never  ? Sexual activity: Yes  ?Other Topics Concern  ? Not on file  ?Social History Narrative  ? Lives with son on and off.    ? Son is in and out of home--student at [employer]  ? 2 daughters in [place] and 1 in [place].  ? ?Social Determinants of Health  ? ?Financial Resource Strain:  Low Risk   ? Difficulty of Paying Living Expenses: Not very hard  ?Food Insecurity: No Food Insecurity  ? Worried About Charity fundraiser in the Last Year: Never true  ? Ran Out of Food in the Last Year: Never true  ?Transportation Needs: Unknown  ? Lack of Transportation (Medical): No  ? Lack of Transportation (Non-Medical): Not on file  ?Physical Activity: Not on file  ?Stress: Not on file  ?Social Connections: Not on file  ?Intimate Partner Violence: Not At Risk  ? Fear of Current or Ex-Partner: No  ? Emotionally Abused: No  ? Physically Abused: No  ? Sexually Abused: No  ? ? ? ?Review of Systems  ?HENT:  Positive for postnasal drip, rhinorrhea and sneezing.   ?Eyes:  Positive for discharge (clear tears), redness, itching (Allergies:  Zyrtec, Allegra, Xyzal, Clarinex have not been helpful.  I had him stop Zyrtec D with his high bp.  Nasonex and Flonase are too irritating to his nose, so stops use.  Has never taken Singulair.  Claritin has helped a bit in past.) and visual disturbance (having regular visits with retinal specialist.  Has had 3 injections in right eye in past year.).  ?Respiratory:  Negative for cough.   ?Cardiovascular:  Negative for chest pain, palpitations and leg swelling.  ?Gastrointestinal:  Negative for abdominal pain and blood in stool (No melena.).  ?Musculoskeletal:   ?     Fingers cramp-happens with gesturing and writing.  Mainly right hand and is right handed.    ?Skin:   ?     Itchy patch on inside of left lower leg/high ankle--has been there for 1 year.    ?Neurological:  Negative for weakness and numbness.  ? ? ? ?Objective:  ? ?BP (!) 150/110 (BP Location: Left Arm, Patient Position: Sitting, Cuff Size: Normal)   Pulse 96   Resp 16   Ht '5\' 6"'$  (1.676 m)   Wt 170 lb (77.1 kg)   BMI 27.44 kg/m?  ? ?Physical Exam ?HENT:  ?   Head: Normocephalic and atraumatic.  ?   Right Ear: Tympanic membrane, ear canal and external ear normal.  ?   Left Ear: Tympanic membrane, ear canal and  external ear normal.  ?   Nose: Mucosal edema and congestion present.  ?   Mouth/Throat:  ?   Mouth: Mucous membranes are moist.  ?   Pharynx: Oropharynx is clear.  ?Eyes:  ?  Extraocular Movements: Extraocular movements intact.  ?   Conjunctiva/sclera:  ?   Right eye: Right conjunctiva is injected.  ?   Left eye: Left conjunctiva is injected.  ?   Pupils: Pupils are equal, round, and reactive to light.  ?   Comments: Clear watery discharge from both eyes  ?Neck:  ?   Thyroid: No thyroid mass or thyromegaly.  ?Cardiovascular:  ?   Rate and Rhythm: Normal rate and regular rhythm.  ?   Pulses:     ?     Dorsalis pedis pulses are 2+ on the right side and 2+ on the left side.  ?     Posterior tibial pulses are 2+ on the right side and 2+ on the left side.  ?   Heart sounds: S1 normal and S2 normal. No murmur heard. ?  No friction rub. No S3 or S4 sounds.  ?   Comments: No carotid bruits.  Carotid, radial, femoral, DP and PT pulses normal and equal.  ?  ?Pulmonary:  ?   Effort: Pulmonary effort is normal.  ?   Breath sounds: Normal breath sounds and air entry.  ?Abdominal:  ?   Palpations: Abdomen is soft. There is no hepatomegaly, splenomegaly or mass.  ?   Tenderness: There is no abdominal tenderness.  ?   Hernia: No hernia is present.  ?Genitourinary: ?   Penis: Circumcised.   ?   Testes:     ?   Right: Mass or tenderness not present. Right testis is descended.     ?   Left: Mass or tenderness not present. Left testis is descended.  ?Musculoskeletal:     ?   General: Normal range of motion.  ?   Cervical back: Normal range of motion and neck supple.  ?   Right lower leg: No edema.  ?   Left lower leg: No edema.  ?Feet:  ?   Right foot:  ?   Protective Sensation: 10 sites tested.  10 sites sensed.  ?   Left foot:  ?   Protective Sensation: 10 sites tested.  10 sites sensed.  ?   Comments: Dry flakiness of plantar feet R>>L ?Lymphadenopathy:  ?   Head:  ?   Right side of head: No submental or submandibular adenopathy.   ?   Left side of head: No submental or submandibular adenopathy.  ?   Cervical: No cervical adenopathy.  ?   Upper Body:  ?   Right upper body: No supraclavicular or axillary adenopathy.  ?   Left upper body:

## 2021-11-25 DIAGNOSIS — D126 Benign neoplasm of colon, unspecified: Secondary | ICD-10-CM | POA: Insufficient documentation

## 2021-11-26 ENCOUNTER — Other Ambulatory Visit (HOSPITAL_COMMUNITY): Payer: Self-pay

## 2021-11-27 LAB — LIPID PANEL W/O CHOL/HDL RATIO
Cholesterol, Total: 108 mg/dL (ref 100–199)
HDL: 37 mg/dL — ABNORMAL LOW (ref >39)
LDL Chol Calc (NIH): 54 mg/dL (ref 0–99)
Triglycerides: 84 mg/dL (ref 0–149)
VLDL Cholesterol Cal: 17 mg/dL (ref 5–40)

## 2021-11-27 LAB — COMPREHENSIVE METABOLIC PANEL
ALT: 19 IU/L (ref 0–44)
AST: 18 IU/L (ref 0–40)
Albumin/Globulin Ratio: 1.6 (ref 1.2–2.2)
Albumin: 4.5 g/dL (ref 3.8–4.8)
Alkaline Phosphatase: 91 IU/L (ref 44–121)
BUN/Creatinine Ratio: 11 (ref 10–24)
BUN: 13 mg/dL (ref 8–27)
Bilirubin Total: 0.7 mg/dL (ref 0.0–1.2)
CO2: 24 mmol/L (ref 20–29)
Calcium: 8.8 mg/dL (ref 8.6–10.2)
Chloride: 102 mmol/L (ref 96–106)
Creatinine, Ser: 1.14 mg/dL (ref 0.76–1.27)
Globulin, Total: 2.8 g/dL (ref 1.5–4.5)
Glucose: 170 mg/dL — ABNORMAL HIGH (ref 70–99)
Potassium: 4.3 mmol/L (ref 3.5–5.2)
Sodium: 140 mmol/L (ref 134–144)
Total Protein: 7.3 g/dL (ref 6.0–8.5)
eGFR: 73 mL/min/{1.73_m2} (ref >59)

## 2021-11-27 LAB — CBC WITH DIFFERENTIAL/PLATELET
Basophils Absolute: 0.1 10*3/uL (ref 0.0–0.2)
Basos: 1 %
EOS (ABSOLUTE): 0.3 10*3/uL (ref 0.0–0.4)
Eos: 7 %
Hematocrit: 49.1 % (ref 37.5–51.0)
Hemoglobin: 16.4 g/dL (ref 13.0–17.7)
Immature Grans (Abs): 0 10*3/uL (ref 0.0–0.1)
Immature Granulocytes: 0 %
Lymphocytes Absolute: 2.3 10*3/uL (ref 0.7–3.1)
Lymphs: 52 %
MCH: 30.3 pg (ref 26.6–33.0)
MCHC: 33.4 g/dL (ref 31.5–35.7)
MCV: 91 fL (ref 79–97)
Monocytes Absolute: 0.3 10*3/uL (ref 0.1–0.9)
Monocytes: 7 %
Neutrophils Absolute: 1.5 10*3/uL (ref 1.4–7.0)
Neutrophils: 33 %
Platelets: 196 10*3/uL (ref 150–450)
RBC: 5.41 x10E6/uL (ref 4.14–5.80)
RDW: 13.5 % (ref 11.6–15.4)
WBC: 4.4 10*3/uL (ref 3.4–10.8)

## 2021-11-27 LAB — MICROALBUMIN / CREATININE URINE RATIO
Creatinine, Urine: 124.6 mg/dL
Microalb/Creat Ratio: 12 mg/g creat (ref 0–29)
Microalbumin, Urine: 14.9 ug/mL

## 2021-11-27 LAB — HGB A1C W/O EAG: Hgb A1c MFr Bld: 8.8 % — ABNORMAL HIGH (ref 4.8–5.6)

## 2021-12-01 ENCOUNTER — Other Ambulatory Visit: Payer: BC Managed Care – PPO

## 2021-12-03 ENCOUNTER — Encounter: Payer: BC Managed Care – PPO | Admitting: Internal Medicine

## 2021-12-26 ENCOUNTER — Other Ambulatory Visit (HOSPITAL_COMMUNITY): Payer: Self-pay

## 2021-12-31 ENCOUNTER — Other Ambulatory Visit (HOSPITAL_COMMUNITY): Payer: Self-pay

## 2022-01-09 ENCOUNTER — Other Ambulatory Visit (HOSPITAL_COMMUNITY): Payer: Self-pay

## 2022-01-14 ENCOUNTER — Other Ambulatory Visit (HOSPITAL_COMMUNITY): Payer: Self-pay

## 2022-01-20 ENCOUNTER — Other Ambulatory Visit (HOSPITAL_COMMUNITY): Payer: Self-pay

## 2022-01-21 ENCOUNTER — Other Ambulatory Visit (HOSPITAL_COMMUNITY): Payer: Self-pay

## 2022-01-30 ENCOUNTER — Other Ambulatory Visit (HOSPITAL_COMMUNITY): Payer: Self-pay

## 2022-01-30 ENCOUNTER — Other Ambulatory Visit: Payer: Self-pay | Admitting: Internal Medicine

## 2022-01-30 MED ORDER — METFORMIN HCL ER 500 MG PO TB24
ORAL_TABLET | ORAL | 3 refills | Status: DC
  Filled 2022-01-30 – 2022-07-08 (×2): qty 360, 90d supply, fill #0

## 2022-01-30 MED ORDER — ATOVAQUONE-PROGUANIL HCL 250-100 MG PO TABS
ORAL_TABLET | ORAL | 0 refills | Status: DC
  Filled 2022-01-30: qty 60, 60d supply, fill #0

## 2022-02-02 ENCOUNTER — Other Ambulatory Visit (HOSPITAL_COMMUNITY): Payer: Self-pay

## 2022-02-03 ENCOUNTER — Other Ambulatory Visit (HOSPITAL_COMMUNITY): Payer: Self-pay

## 2022-02-11 ENCOUNTER — Other Ambulatory Visit (HOSPITAL_COMMUNITY): Payer: Self-pay

## 2022-04-30 ENCOUNTER — Other Ambulatory Visit (HOSPITAL_COMMUNITY): Payer: Self-pay

## 2022-07-08 ENCOUNTER — Other Ambulatory Visit: Payer: Self-pay | Admitting: Internal Medicine

## 2022-07-08 ENCOUNTER — Other Ambulatory Visit (HOSPITAL_COMMUNITY): Payer: Self-pay

## 2022-07-09 ENCOUNTER — Other Ambulatory Visit (HOSPITAL_COMMUNITY): Payer: Self-pay

## 2022-07-10 ENCOUNTER — Other Ambulatory Visit (HOSPITAL_COMMUNITY): Payer: Self-pay

## 2022-07-10 MED ORDER — OZEMPIC (1 MG/DOSE) 4 MG/3ML ~~LOC~~ SOPN
1.0000 mg | PEN_INJECTOR | SUBCUTANEOUS | 5 refills | Status: DC
  Filled 2022-07-10: qty 3, 28d supply, fill #0
  Filled 2022-08-11: qty 3, 28d supply, fill #1
  Filled 2022-09-28: qty 3, 28d supply, fill #2
  Filled 2023-03-04: qty 3, 28d supply, fill #3
  Filled 2023-03-04: qty 6, 56d supply, fill #3

## 2022-07-14 ENCOUNTER — Other Ambulatory Visit (HOSPITAL_COMMUNITY): Payer: Self-pay

## 2022-08-11 ENCOUNTER — Other Ambulatory Visit (HOSPITAL_COMMUNITY): Payer: Self-pay

## 2022-08-29 ENCOUNTER — Other Ambulatory Visit (HOSPITAL_COMMUNITY): Payer: Self-pay

## 2022-09-28 ENCOUNTER — Other Ambulatory Visit (HOSPITAL_COMMUNITY): Payer: Self-pay

## 2022-09-28 ENCOUNTER — Other Ambulatory Visit: Payer: Self-pay | Admitting: Emergency Medicine

## 2022-09-28 ENCOUNTER — Other Ambulatory Visit: Payer: Self-pay | Admitting: Internal Medicine

## 2022-09-28 DIAGNOSIS — E1159 Type 2 diabetes mellitus with other circulatory complications: Secondary | ICD-10-CM

## 2022-10-02 ENCOUNTER — Other Ambulatory Visit (HOSPITAL_COMMUNITY): Payer: Self-pay

## 2022-10-04 ENCOUNTER — Other Ambulatory Visit: Payer: Self-pay

## 2022-10-04 MED ORDER — ROSUVASTATIN CALCIUM 20 MG PO TABS
20.0000 mg | ORAL_TABLET | Freq: Every day | ORAL | 0 refills | Status: DC
Start: 2022-10-04 — End: 2023-02-03
  Filled 2022-10-04 – 2022-10-20 (×2): qty 60, 60d supply, fill #0

## 2022-10-05 ENCOUNTER — Other Ambulatory Visit (HOSPITAL_COMMUNITY): Payer: Self-pay

## 2022-10-05 ENCOUNTER — Other Ambulatory Visit: Payer: Self-pay

## 2022-10-08 ENCOUNTER — Other Ambulatory Visit (INDEPENDENT_AMBULATORY_CARE_PROVIDER_SITE_OTHER): Payer: BC Managed Care – PPO

## 2022-10-08 DIAGNOSIS — E1165 Type 2 diabetes mellitus with hyperglycemia: Secondary | ICD-10-CM | POA: Diagnosis not present

## 2022-10-08 DIAGNOSIS — Z79899 Other long term (current) drug therapy: Secondary | ICD-10-CM

## 2022-10-08 DIAGNOSIS — E785 Hyperlipidemia, unspecified: Secondary | ICD-10-CM

## 2022-10-09 LAB — COMPREHENSIVE METABOLIC PANEL
ALT: 22 IU/L (ref 0–44)
AST: 17 IU/L (ref 0–40)
Albumin/Globulin Ratio: 1.7 (ref 1.2–2.2)
Albumin: 4.6 g/dL (ref 3.9–4.9)
Alkaline Phosphatase: 81 IU/L (ref 44–121)
BUN/Creatinine Ratio: 15 (ref 10–24)
BUN: 20 mg/dL (ref 8–27)
Bilirubin Total: 0.8 mg/dL (ref 0.0–1.2)
CO2: 27 mmol/L (ref 20–29)
Calcium: 9.5 mg/dL (ref 8.6–10.2)
Chloride: 99 mmol/L (ref 96–106)
Creatinine, Ser: 1.37 mg/dL — ABNORMAL HIGH (ref 0.76–1.27)
Globulin, Total: 2.7 g/dL (ref 1.5–4.5)
Glucose: 173 mg/dL — ABNORMAL HIGH (ref 70–99)
Potassium: 4.1 mmol/L (ref 3.5–5.2)
Sodium: 142 mmol/L (ref 134–144)
Total Protein: 7.3 g/dL (ref 6.0–8.5)
eGFR: 58 mL/min/{1.73_m2} — ABNORMAL LOW (ref >59)

## 2022-10-09 LAB — LIPID PANEL W/O CHOL/HDL RATIO
Cholesterol, Total: 140 mg/dL (ref 100–199)
HDL: 46 mg/dL (ref >39)
LDL Chol Calc (NIH): 74 mg/dL (ref 0–99)
Triglycerides: 111 mg/dL (ref 0–149)
VLDL Cholesterol Cal: 20 mg/dL (ref 5–40)

## 2022-10-09 LAB — HEMOGLOBIN A1C
Est. average glucose Bld gHb Est-mCnc: 229 mg/dL
Hgb A1c MFr Bld: 9.6 % — ABNORMAL HIGH (ref 4.8–5.6)

## 2022-10-10 ENCOUNTER — Other Ambulatory Visit (HOSPITAL_COMMUNITY): Payer: Self-pay

## 2022-10-12 ENCOUNTER — Telehealth: Payer: Self-pay

## 2022-10-12 ENCOUNTER — Other Ambulatory Visit: Payer: Self-pay | Admitting: Internal Medicine

## 2022-10-12 NOTE — Telephone Encounter (Signed)
Patient is only available after 5pm

## 2022-10-12 NOTE — Telephone Encounter (Signed)
Patient needs appointment to discuss labs, per Dr Amil Amen request.

## 2022-10-13 ENCOUNTER — Other Ambulatory Visit (HOSPITAL_COMMUNITY): Payer: Self-pay

## 2022-10-16 ENCOUNTER — Other Ambulatory Visit (HOSPITAL_COMMUNITY): Payer: Self-pay

## 2022-10-16 ENCOUNTER — Other Ambulatory Visit: Payer: Self-pay

## 2022-10-16 MED ORDER — TADALAFIL 10 MG PO TABS
10.0000 mg | ORAL_TABLET | Freq: Every day | ORAL | 2 refills | Status: DC | PRN
  Filled 2022-10-16 – 2022-10-20 (×2): qty 10, 10d supply, fill #0

## 2022-10-19 ENCOUNTER — Other Ambulatory Visit (HOSPITAL_COMMUNITY): Payer: Self-pay

## 2022-10-20 ENCOUNTER — Other Ambulatory Visit: Payer: Self-pay

## 2022-10-20 ENCOUNTER — Ambulatory Visit (AMBULATORY_SURGERY_CENTER): Payer: Commercial Managed Care - PPO | Admitting: *Deleted

## 2022-10-20 ENCOUNTER — Other Ambulatory Visit (HOSPITAL_COMMUNITY): Payer: Self-pay

## 2022-10-20 VITALS — Ht 66.0 in | Wt 165.0 lb

## 2022-10-20 DIAGNOSIS — Z86010 Personal history of colonic polyps: Secondary | ICD-10-CM

## 2022-10-20 MED ORDER — NA SULFATE-K SULFATE-MG SULF 17.5-3.13-1.6 GM/177ML PO SOLN
1.0000 | Freq: Once | ORAL | 0 refills | Status: AC
  Filled 2022-10-20: qty 354, 7d supply, fill #0
  Filled 2022-11-07: qty 354, 1d supply, fill #0

## 2022-10-20 NOTE — Progress Notes (Signed)
No egg or soy allergy known to patient  No issues known to pt with past sedation with any surgeries or procedures Patient denies ever being  intubated No issues with moving head or neck  No issues with swallowing No FH of Malignant Hyperthermia Pt is not on diet pills Pt is not on  home 02  Pt is not on blood thinners  Pt denies issues with constipation  Pt is not on dialysis Pt denies any upcoming cardiac testing Pt encouraged to use to use Singlecare or Goodrx to reduce cost  Patient's chart reviewed by Osvaldo Angst CNRA prior to previsit and patient appropriate for the [place].  Previsit completed and red dot placed by patient's name on their procedure day (on provider's schedule).  . Visit by phone Instructions reviewed with pt and pt states understanding. Instructed to review again prior to procedure. Pt states they will. Instructions have been sent by mail and my chart

## 2022-10-23 ENCOUNTER — Encounter: Payer: Self-pay | Admitting: Gastroenterology

## 2022-10-28 ENCOUNTER — Other Ambulatory Visit (HOSPITAL_COMMUNITY): Payer: Self-pay

## 2022-11-07 ENCOUNTER — Other Ambulatory Visit (HOSPITAL_COMMUNITY): Payer: Self-pay

## 2022-11-08 ENCOUNTER — Ambulatory Visit
Admission: EM | Admit: 2022-11-08 | Discharge: 2022-11-08 | Disposition: A | Discharge status: Home / Self Care | Payer: Commercial Managed Care - PPO | Attending: Internal Medicine | Admitting: Internal Medicine

## 2022-11-08 ENCOUNTER — Encounter: Payer: Self-pay | Admitting: Emergency Medicine

## 2022-11-08 ENCOUNTER — Emergency Department (HOSPITAL_COMMUNITY)
Admission: EM | Admit: 2022-11-08 | Discharge: 2022-11-08 | Disposition: A | Discharge status: Home / Self Care | Payer: Commercial Managed Care - PPO | Attending: Emergency Medicine | Admitting: Emergency Medicine

## 2022-11-08 ENCOUNTER — Other Ambulatory Visit: Payer: Self-pay

## 2022-11-08 DIAGNOSIS — L0201 Cutaneous abscess of face: Secondary | ICD-10-CM

## 2022-11-08 DIAGNOSIS — Z7984 Long term (current) use of oral hypoglycemic drugs: Secondary | ICD-10-CM | POA: Insufficient documentation

## 2022-11-08 DIAGNOSIS — I10 Essential (primary) hypertension: Secondary | ICD-10-CM | POA: Diagnosis not present

## 2022-11-08 DIAGNOSIS — E119 Type 2 diabetes mellitus without complications: Secondary | ICD-10-CM | POA: Diagnosis not present

## 2022-11-08 DIAGNOSIS — Z79899 Other long term (current) drug therapy: Secondary | ICD-10-CM | POA: Diagnosis not present

## 2022-11-08 DIAGNOSIS — L0291 Cutaneous abscess, unspecified: Secondary | ICD-10-CM

## 2022-11-08 MED ORDER — SULFAMETHOXAZOLE-TRIMETHOPRIM 800-160 MG PO TABS: 1.0000 | ORAL_TABLET | Freq: Two times a day (BID) | ORAL | 0 refills | Status: AC

## 2022-11-08 MED ORDER — OXYCODONE-ACETAMINOPHEN 5-325 MG PO TABS
1.0000 | ORAL_TABLET | Freq: Once | ORAL | Status: AC
  Administered 2022-11-08: 1 via ORAL
  Filled 2022-11-08: qty 1

## 2022-11-08 MED ORDER — LIDOCAINE HCL (PF) 1 % IJ SOLN
5.0000 mL | Freq: Once | INTRAMUSCULAR | Status: AC
  Administered 2022-11-08: 5 mL
  Filled 2022-11-08: qty 30

## 2022-11-08 NOTE — ED Triage Notes (Signed)
Pt here for abscess with some swelling, drainage and pain x 10 days to right side of face

## 2022-11-08 NOTE — ED Triage Notes (Signed)
Pt arrived via POV. C/o R side facial abscess. Pt has notable swelling, pain, and some drainage for 10x days. Referred here from UC.  AOx4

## 2022-11-08 NOTE — Discharge Instructions (Signed)
You were seen in the emergency department today for an abscess to your face.  We opened up the area to help drain.  You are going to take antibiotics twice a day over the next 7 days.  Please return if you have significant worsening symptoms or pain.  Please also follow-up with your primary care provider as needed.  You may remove the gauze on your face tomorrow and use a Band-Aid in place.  Please keep your face clean and dry.

## 2022-11-08 NOTE — ED Notes (Signed)
Patient is being discharged from the Urgent Care and sent to the Emergency Department via POV . Per PL, patient is in need of higher level of care due to facial abscess. Patient is aware and verbalizes understanding of plan of care.  Vitals:   11/08/22 0904  BP: (!) 173/108  Pulse: 98  Resp: 18  Temp: 97.8 F (36.6 C)  SpO2: 98%

## 2022-11-08 NOTE — Discharge Instructions (Signed)
Please go to the emergency department for further evaluation.  The location of the abscess and the size of the abscess requires good sedation/anesthesia for effective incision and drainage.

## 2022-11-08 NOTE — ED Provider Notes (Signed)
[facility]    CSN: [id number] Arrival date & time: 11/08/22  0825      History   Chief Complaint Chief Complaint  Patient presents with   Abscess    HPI Danny Vazquez is a 62 y.o. male was to the urgent care with painful facial swelling of 10 days duration.  Facial swelling has worsened over the past several days.  Patient noticed some purulent drainage from the swelling a couple of days ago.  Pain is associated with redness over the right side of the face as well as weakness over the right lower face.  No difficulty with speech.  No numbness or tingling.  No fever or chills.  No extremity weakness.  Patient denies any trauma to the face. HPI  Past Medical History:  Diagnosis Date   Allergy    Diabetes mellitus without complication ([place])    HTN (hypertension)    Hyperlipidemia    Seasonal allergies    Sleep apnea     Patient Active Problem List   Diagnosis Date Noted   Colon adenomas 11/25/2021   Diabetic retinopathy associated with type 2 diabetes mellitus ([place]) 11/24/2021   Seasonal allergies 05/27/2021   Type 2 diabetes mellitus with hyperglycemia, without long-term current use of insulin ([place]) 03/26/2020   Abnormal EKG 10/04/2012   COLONIC POLYPS, ADENOMATOUS, BENIGN 04/21/2010   VENTRICULAR HYPERTROPHY, LEFT 03/26/2010   SLEEP APNEA 02/12/2010   GOUT, UNSPECIFIED 12/12/2009   Uncontrolled type 2 diabetes mellitus with hyperglycemia ([place]) 11/12/2009   SNORING 10/28/2009   DEPRESSION 06/13/2008   PTERYGIUM, BILATERAL 05/08/2007   Erectile dysfunction 05/08/2007   Hyperlipidemia 04/21/2007   Hypertension associated with diabetes ([place]) 04/21/2007    Past Surgical History:  Procedure Laterality Date   None         Home Medications    Prior to Admission medications   Medication Sig Start Date End Date Taking? Authorizing Provider  amLODipine (NORVASC) 10 MG tablet Take 1 tablet (10 mg total) by mouth daily. 11/24/21   Mack Hook, MD   atovaquone-proguanil (MALARONE) 250-100 MG TABS tablet Take 1 tablet by mouth daily for 2 days prior to travel and continue daily until 7 days after return Patient not taking: Reported on 10/20/2022 01/30/22   Mack Hook, MD  blood glucose meter kit and supplies KIT Use up to four times daily as directed. Dispense based on pt & ins preference. Dx: E11.65, Z79.4 07/25/18   Horald Pollen, MD  carvedilol (COREG CR) 10 MG 24 hr capsule Take 1 capsule by mouth daily. Patient not taking: Reported on 10/20/2022 05/27/21   Mack Hook, MD  clobetasol cream (TEMOVATE) 0.05 % Apply topically 2 times daily as needed 11/24/21   Mack Hook, MD  folic acid (FOLVITE) Q000111Q MCG tablet Take 400 mcg by mouth 2 (two) times daily.    [provider]  glucose blood (ONETOUCH VERIO) test strip Use 2x a day 06/02/15   Darlyne Russian, MD  loratadine (CLARITIN) 10 MG tablet Take 1 tablet (10 mg total) by mouth daily. 11/24/21   Mack Hook, MD  losartan-hydrochlorothiazide (HYZAAR) 100-12.5 MG tablet TAKE 1 TABLET BY MOUTH DAILY. 08/07/21 10/07/22  Horald Pollen, MD  metFORMIN (GLUCOPHAGE-XR) 500 MG 24 hr tablet Take 2 tablets by mouth twice daily with a meal 01/30/22   Mack Hook, MD  montelukast (SINGULAIR) 10 MG tablet Take 1 tablet by mouth at bedtime. Patient not taking: Reported on 10/20/2022 11/24/21   Mack Hook, MD  Na Sulfate-K Sulfate-Mg Sulf 17.5-3.13-1.6 GM/177ML SOLN Take 1 kit by mouth once for 1 dose. 10/20/22 11/08/22  Doran Stabler, MD  Olopatadine HCl 0.2 % SOLN Place 1 drop into both eyes daily as needed for allergies 11/24/21   Mack Hook, MD  ONE [facility] LANCETS MISC Use 2x a day 07/07/18   Horald Pollen, MD  rosuvastatin (CRESTOR) 20 MG tablet Take 1 tablet by mouth daily. Need to make appointment for further refills. 10/04/22   Mack Hook, MD  Semaglutide, 1 MG/DOSE, (OZEMPIC, 1 MG/DOSE,) 4 MG/3ML SOPN Inject 1 mg  into the skin once a week. 07/10/22 07/10/23  Mack Hook, MD  tadalafil (CIALIS) 10 MG tablet Take 1 tablet by mouth daily as needed for erectile dysfunction. Appointment required for further refills 10/16/22   Mack Hook, MD  Zinc 50 MG CAPS Take 50 mg by mouth 2 (two) times daily as needed.    [provider]    Family History Family History  Problem Relation Age of Onset   Diabetes Mother    Hypertension Mother    Hypertension Father    Heart disease Father        CHF   Stomach cancer Brother    Cancer Brother        Does not have all the info, but some sort of intestinal cancer.   Hypertension Brother    Diabetes Brother    Diabetes Brother    Hypertension Brother    Hypertension Brother    Diabetes Brother    Heart disease Brother        CHF   Hypertension Brother    Diabetes Brother    Diabetes Brother    Hypertension Brother    Hypertension Brother    Diabetes Brother    Diabetes Brother    Hypertension Brother    Hypertension Brother    Diabetes Brother    Colon cancer Neg Hx    Colon polyps Neg Hx    Esophageal cancer Neg Hx    Rectal cancer Neg Hx     Social History Social History   Tobacco Use   Smoking status: Never    Passive exposure: Never   Smokeless tobacco: Never  Vaping Use   Vaping Use: Never used  Substance Use Topics   Alcohol use: Yes    Comment: occasionally   Drug use: Never     Allergies   Food, Dulaglutide, and Penicillins   Review of Systems Review of Systems As per HPI  Physical Exam Triage Vital Signs ED Triage Vitals [11/08/22 0904]  Enc Vitals Group     BP (!) 173/108     Pulse Rate 98     Resp 18     Temp 97.8 F (36.6 C)     Temp Source Oral     SpO2 98 %     Weight      Height      Head Circumference      Peak Flow      Pain Score 3     Pain Loc      Pain Edu?      Excl. in [place]?    No data found.  Updated Vital Signs BP (!) 173/108 (BP Location: Left Arm)   Pulse 98    Temp 97.8 F (36.6 C) (Oral)   Resp 18   SpO2 98%   Visual Acuity Right Eye Distance:   Left Eye Distance:   Bilateral Distance:  Right Eye Near:   Left Eye Near:    Bilateral Near:     Physical Exam Vitals and nursing note reviewed.  Constitutional:      Appearance: Normal appearance. He is ill-appearing.  HENT:     Right Ear: Tympanic membrane normal.     Left Ear: Tympanic membrane normal.     Nose: Nose normal. No rhinorrhea.     Comments: Tender and indurated swelling over the right maxilla.  Overlying erythema.  The indurated area has 2 pustular lesions.  Swelling is not attached to underlying tissue.  The deroofing of one of the pustular lesions noted small amount of pus. Abdominal:     General: Bowel sounds are normal.     Palpations: Abdomen is soft.  Neurological:     General: No focal deficit present.     Mental Status: He is alert and oriented to person, place, and time.     Comments: Drooping of the right lower half of the face.  Sensation remains intact.  Psychiatric:        Mood and Affect: Mood normal.        Behavior: Behavior normal.      UC Treatments / Results  Labs (all labs ordered are listed, but only abnormal results are displayed) Labs Reviewed - No data to display  EKG   Radiology No results found.  Procedures Procedures (including critical care time)  Medications Ordered in UC Medications - No data to display  Initial Impression / Assessment and Plan / UC Course  I have reviewed the triage vital signs and the nursing notes.  Pertinent labs & imaging results that were available during my care of the patient were reviewed by me and considered in my medical decision making (see chart for details).     1.  Facial abscess: Patient is advised to go to the emergency department for further management. Given the location of the abscess and the presence of right sided lower facial weakness, the emergency room is a more appropriate  place for patient to receive definitive care for the swelling/abscess on the right maxilla. Return precautions given. Final Clinical Impressions(s) / UC Diagnoses   Final diagnoses:  Facial abscess     Discharge Instructions      Please go to the emergency department for further evaluation.  The location of the abscess and the size of the abscess requires good sedation/anesthesia for effective incision and drainage.   ED Prescriptions   None    PDMP not reviewed this encounter.   Chase Picket, MD 11/08/22 [phone]

## 2022-11-08 NOTE — ED Provider Notes (Signed)
[place] EMERGENCY DEPARTMENT AT [facility] Provider Note   CSN: [id number] Arrival date & time: 11/08/22  1001     History  Chief Complaint  Patient presents with   Abscess    Danny Vazquez is a 62 y.o. male.  With past medical history of type 2 diabetes, hypertension, sleep apnea who presents to the emergency department for facial abscess.  Patient states that he noticed a small bump to his right cheek about 10 days ago.  He states that it is slowly grown and become more tender.  He states that about 3 days ago he has had a small amount of oozing coming from the area but no significant discharge.  He denies having fevers.  He states that he went to urgent care and they sent him here for management.   Abscess      Home Medications Prior to Admission medications   Medication Sig Start Date End Date Taking? Authorizing Provider  sulfamethoxazole-trimethoprim (BACTRIM DS) 800-160 MG tablet Take 1 tablet by mouth 2 (two) times daily for 7 days. 11/08/22 11/15/22 Yes Mickie Hillier, PA-C  amLODipine (NORVASC) 10 MG tablet Take 1 tablet (10 mg total) by mouth daily. 11/24/21   Mack Hook, MD  atovaquone-proguanil (MALARONE) 250-100 MG TABS tablet Take 1 tablet by mouth daily for 2 days prior to travel and continue daily until 7 days after return Patient not taking: Reported on 10/20/2022 01/30/22   Mack Hook, MD  blood glucose meter kit and supplies KIT Use up to four times daily as directed. Dispense based on pt & ins preference. Dx: E11.65, Z79.4 07/25/18   Horald Pollen, MD  carvedilol (COREG CR) 10 MG 24 hr capsule Take 1 capsule by mouth daily. Patient not taking: Reported on 10/20/2022 05/27/21   Mack Hook, MD  clobetasol cream (TEMOVATE) 0.05 % Apply topically 2 times daily as needed 11/24/21   Mack Hook, MD  folic acid (FOLVITE) Q000111Q MCG tablet Take 400 mcg by mouth 2 (two) times daily.    [provider]  glucose blood  (ONETOUCH VERIO) test strip Use 2x a day 06/02/15   Darlyne Russian, MD  loratadine (CLARITIN) 10 MG tablet Take 1 tablet (10 mg total) by mouth daily. 11/24/21   Mack Hook, MD  losartan-hydrochlorothiazide (HYZAAR) 100-12.5 MG tablet TAKE 1 TABLET BY MOUTH DAILY. 08/07/21 10/07/22  Horald Pollen, MD  metFORMIN (GLUCOPHAGE-XR) 500 MG 24 hr tablet Take 2 tablets by mouth twice daily with a meal 01/30/22   Mack Hook, MD  montelukast (SINGULAIR) 10 MG tablet Take 1 tablet by mouth at bedtime. Patient not taking: Reported on 10/20/2022 11/24/21   Mack Hook, MD  Na Sulfate-K Sulfate-Mg Sulf 17.5-3.13-1.6 GM/177ML SOLN Take 1 kit by mouth once for 1 dose. 10/20/22 11/08/22  Doran Stabler, MD  Olopatadine HCl 0.2 % SOLN Place 1 drop into both eyes daily as needed for allergies 11/24/21   Mack Hook, MD  ONE [facility] LANCETS MISC Use 2x a day 07/07/18   Horald Pollen, MD  rosuvastatin (CRESTOR) 20 MG tablet Take 1 tablet by mouth daily. Need to make appointment for further refills. 10/04/22   Mack Hook, MD  Semaglutide, 1 MG/DOSE, (OZEMPIC, 1 MG/DOSE,) 4 MG/3ML SOPN Inject 1 mg into the skin once a week. 07/10/22 07/10/23  Mack Hook, MD  tadalafil (CIALIS) 10 MG tablet Take 1 tablet by mouth daily as needed for erectile dysfunction. Appointment required for further refills 10/16/22  Mack Hook, MD  Zinc 50 MG CAPS Take 50 mg by mouth 2 (two) times daily as needed.    [provider]      Allergies    Food, Dulaglutide, and Penicillins    Review of Systems   Review of Systems  Skin:  Positive for wound.  All other systems reviewed and are negative.   Physical Exam Updated Vital Signs BP (!) 179/120   Pulse 86   Temp 97.9 F (36.6 C) (Oral)   Resp 16   Ht 5\' 6"  (1.676 m)   Wt 74.8 kg   SpO2 100%   BMI 26.63 kg/m  Physical Exam Vitals and nursing note reviewed.  Constitutional:      General: He is not in acute  distress.    Appearance: Normal appearance. He is not ill-appearing or toxic-appearing.  HENT:     Head: Normocephalic.  Eyes:     General: No scleral icterus. Cardiovascular:     Rate and Rhythm: Normal rate and regular rhythm.  Pulmonary:     Effort: Pulmonary effort is normal.     Breath sounds: Normal breath sounds.  Abdominal:     General: Bowel sounds are normal.     Palpations: Abdomen is soft.  Musculoskeletal:     Cervical back: Normal range of motion and neck supple. No tenderness.  Lymphadenopathy:     Cervical: No cervical adenopathy.  Skin:    General: Skin is warm and dry.     Capillary Refill: Capillary refill takes less than 2 seconds.     Comments: 5 cm abscess to the right cheek   Neurological:     General: No focal deficit present.     Mental Status: He is alert and oriented to person, place, and time. Mental status is at baseline.  Psychiatric:        Mood and Affect: Mood normal.        Behavior: Behavior normal.        Thought Content: Thought content normal.        Judgment: Judgment normal.     ED Results / Procedures / Treatments   Labs (all labs ordered are listed, but only abnormal results are displayed) Labs Reviewed - No data to display  EKG None  Radiology No results found.  Procedures .Marland KitchenIncision and Drainage  Date/Time: 11/08/2022 1:59 PM  Performed by: Mickie Hillier, PA-C Authorized by: Mickie Hillier, PA-C   Consent:    Consent obtained:  Verbal   Consent given by:  Patient   Risks, benefits, and alternatives were discussed: yes     Risks discussed:  Bleeding, incomplete drainage, pain and infection   Alternatives discussed:  No treatment, delayed treatment and alternative treatment Universal protocol:    Procedure explained and questions answered to patient or proxy's satisfaction: yes     Relevant documents present and verified: yes     Required blood products, implants, devices, and special equipment available: yes      Site/side marked: yes     Immediately prior to procedure, a time out was called: yes     Patient identity confirmed:  Verbally with patient Location:    Type:  Abscess   Size:  5cm   Location:  Head   Head location:  Face Pre-procedure details:    Skin preparation:  Povidone-iodine Sedation:    Sedation type:  None Anesthesia:    Anesthesia method:  Local infiltration   Local anesthetic:  Lidocaine 1% w/o  epi Procedure type:    Complexity:  Complex Procedure details:    Ultrasound guidance: no     Incision types:  Stab incision   Incision depth:  Dermal   Wound management:  Probed and deloculated   Drainage:  Bloody and purulent   Drainage amount:  Scant   Wound treatment:  Wound left open   Packing materials:  1/4 in iodoform gauze   Amount 1/4" iodoform:  1 inch Post-procedure details:    Procedure completion:  Tolerated with difficulty    Medications Ordered in ED Medications  lidocaine (PF) (XYLOCAINE) 1 % injection 5 mL (5 mLs Infiltration Given 11/08/22 1327)  oxyCODONE-acetaminophen (PERCOCET/ROXICET) 5-325 MG per tablet 1 tablet (1 tablet Oral Given 11/08/22 1327)    ED Course/ Medical Decision Making/ A&P    Medical Decision Making Risk Prescription drug management.  Initial Impression and Ddx 62 year old male who presents to the emergency department with abscess to the right cheek Patient PMH that increases complexity of ED encounter: Diabetes, hypertension, hyperlipidemia Differential: abscess, cellulitis, deep space infection, etc.   Interpretation of Diagnostics I independent reviewed and interpreted the labs as followed: Not indicated  - I independently visualized the following imaging with scope of interpretation limited to determining acute life threatening conditions related to emergency care: Not indicated  Patient Reassessment and Ultimate Disposition/Management 62 year old male who presents to the emergency department with abscess to the right  cheek.  He is nonseptic and nontoxic appearing.  He is afebrile.  Do not feel that he needs laboratory workup.  The abscess appears to be superficial.  Do not feel that he needs CT imaging of his face at this time.  I performed an I&D.  I only was able to get a scant amount of purulent material and then blood from the area.  He had significant pain during the procedure despite local anesthesia.  I attempted to probe and deloculate the area but this was also significantly painful and was getting limited return.  Additionally, cosmetically attempting to not make a large incision.  I am going to place him on Bactrim twice a day over the next 7 days and attempt to improve his symptoms.  The area is more open now to continue draining.  It was incompletely probed and deloculated today.  I have given him return precautions if it continues to swell or worsen or he begins to have fever.  He verbalized understanding. If he returns may need plastics involved given location.   The patient has been appropriately medically screened and/or stabilized in the ED. I have low suspicion for any other emergent medical condition which would require further screening, evaluation or treatment in the ED or require inpatient management. At time of discharge the patient is hemodynamically stable and in no acute distress. I have discussed work-up results and diagnosis with patient and answered all questions. Patient is agreeable with discharge plan. We discussed strict return precautions for returning to the emergency department and they verbalized understanding.     Patient management required discussion with the following services or consulting groups:  None  Complexity of Problems Addressed Acute complicated illness or Injury  Additional Data Reviewed and Analyzed Further history obtained from: Further history from spouse/family member, Past medical history and medications listed in the EMR, and Recent discharge  summary  Patient Encounter Risk Assessment Prescriptions and Minor Procedures  Final Clinical Impression(s) / ED Diagnoses Final diagnoses:  Abscess    Rx / DC Orders ED Discharge Orders  Ordered    sulfamethoxazole-trimethoprim (BACTRIM DS) 800-160 MG tablet  2 times daily        11/08/22 1312              Mickie Hillier, PA-C 11/08/22 1421    Dorie Rank, MD 11/09/22 [phone]

## 2022-11-15 ENCOUNTER — Encounter: Payer: Self-pay | Admitting: Certified Registered Nurse Anesthetist

## 2022-11-16 ENCOUNTER — Encounter: Payer: Self-pay | Admitting: Gastroenterology

## 2022-11-16 ENCOUNTER — Ambulatory Visit (AMBULATORY_SURGERY_CENTER): Payer: Commercial Managed Care - PPO | Admitting: Gastroenterology

## 2022-11-16 VITALS — BP 140/82 | HR 80 | Temp 98.2°F | Resp 11 | Ht 66.0 in | Wt 165.0 lb

## 2022-11-16 DIAGNOSIS — E119 Type 2 diabetes mellitus without complications: Secondary | ICD-10-CM | POA: Diagnosis not present

## 2022-11-16 DIAGNOSIS — Z86010 Personal history of colonic polyps: Secondary | ICD-10-CM

## 2022-11-16 DIAGNOSIS — D120 Benign neoplasm of cecum: Secondary | ICD-10-CM

## 2022-11-16 DIAGNOSIS — D123 Benign neoplasm of transverse colon: Secondary | ICD-10-CM | POA: Diagnosis not present

## 2022-11-16 DIAGNOSIS — Z09 Encounter for follow-up examination after completed treatment for conditions other than malignant neoplasm: Secondary | ICD-10-CM

## 2022-11-16 DIAGNOSIS — I10 Essential (primary) hypertension: Secondary | ICD-10-CM | POA: Diagnosis not present

## 2022-11-16 DIAGNOSIS — D124 Benign neoplasm of descending colon: Secondary | ICD-10-CM | POA: Diagnosis not present

## 2022-11-16 DIAGNOSIS — Z1211 Encounter for screening for malignant neoplasm of colon: Secondary | ICD-10-CM | POA: Diagnosis not present

## 2022-11-16 DIAGNOSIS — G4733 Obstructive sleep apnea (adult) (pediatric): Secondary | ICD-10-CM | POA: Diagnosis not present

## 2022-11-16 MED ORDER — SODIUM CHLORIDE 0.9 % IV SOLN: 500.0000 mL | Freq: Once | INTRAVENOUS | Status: DC

## 2022-11-16 NOTE — Op Note (Signed)
[place] Patient Name: Danny Vazquez Procedure Date: 11/16/2022 2:47 PM MRN: [id number] Endoscopist: [place]. Loletha Carrow , MD, [id number] Age: 62 Referring MD:  Date of Birth: 1960/09/18 Gender: Male Account #: [id number] Procedure:                Colonoscopy Indications:              Surveillance: Personal history of adenomatous                            polyps on last colonoscopy > 5 years ago                           Subcentimeter tubular adenoma August 2011 Medicines:                Monitored Anesthesia Care Procedure:                Pre-Anesthesia Assessment:                           - Prior to the procedure, a History and Physical                            was performed, and patient medications and                            allergies were reviewed. The patient's tolerance of                            previous anesthesia was also reviewed. The risks                            and benefits of the procedure and the sedation                            options and risks were discussed with the patient.                            All questions were answered, and informed consent                            was obtained. Prior Anticoagulants: The patient has                            taken no anticoagulant or antiplatelet agents. ASA                            Grade Assessment: II - A patient with mild systemic                            disease. After reviewing the risks and benefits,                            the patient was deemed in satisfactory condition to  undergo the procedure.                           After obtaining informed consent, the colonoscope                            was passed under direct vision. Throughout the                            procedure, the patient's blood pressure, pulse, and                            oxygen saturations were monitored continuously. The                            CF [id number] was introduced  through the anus                            and advanced to the the cecum, identified by                            appendiceal orifice and ileocecal valve. The                            colonoscopy was somewhat difficult due to a                            redundant colon, fair prep in some areas and                            spasm.. Successful completion of the procedure was                            aided by using manual pressure, straightening and                            shortening the scope to obtain bowel loop reduction                            and lavage. The patient tolerated the procedure                            well. The bowel preparation used was SUPREP via                            split dose instruction. The quality of the bowel                            preparation was good in some areas, fair in others                            with fibrous debris that could not be completely  cleared (see photos). The ileocecal valve,                            appendiceal orifice, and rectum were photographed. Scope In: 2:50:17 PM Scope Out: 3:16:33 PM Scope Withdrawal Time: 0 hours 23 minutes 27 seconds  Total Procedure Duration: 0 hours 26 minutes 16 seconds  Findings:                 The perianal and digital rectal examinations were                            normal.                           Repeat examination of right colon under NBI                            performed.                           A 6 mm polyp was found in the cecum. The polyp was                            pedunculated. The polyp was removed with a hot                            snare. Resection and retrieval were complete.                           A diminutive polyp was found in the transverse                            colon. The polyp was sessile. The polyp was removed                            with a cold snare. Resection and retrieval were                             complete.                           A 10 mm polyp was found in the splenic flexure. The                            polyp was pedunculated. The polyp was removed with                            a hot snare. Resection and retrieval were complete.                           The sigmoid colon was redundant.                           Internal hemorrhoids were found.  The exam was otherwise without abnormality on                            direct and retroflexion views. Complications:            No immediate complications. Estimated Blood Loss:     Estimated blood loss was minimal. Impression:               - One 6 mm polyp in the cecum, removed with a hot                            snare. Resected and retrieved.                           - One diminutive polyp in the transverse colon,                            removed with a cold snare. Resected and retrieved.                           - One 10 mm polyp at the splenic flexure, removed                            with a hot snare. Resected and retrieved.                           - Redundant colon.                           - Internal hemorrhoids.                           - The examination was otherwise normal on direct                            and retroflexion views. Recommendation:           - Patient has a contact number available for                            emergencies. The signs and symptoms of potential                            delayed complications were discussed with the                            patient. Return to normal activities tomorrow.                            Written discharge instructions were provided to the                            patient.                           - Resume previous diet.                           -  Continue present medications.                           - Await pathology results.                           - Repeat colonoscopy in 2 years for surveillance                             (see prep details above). For next colonoscopy,                            GoLytely prep and close attention to preprocedure                            dietary restrictions regarding fibrous foods. Phynix Horton L. Loletha Carrow, MD 11/16/2022 3:22:57 PM This report has been signed electronically.

## 2022-11-16 NOTE — Progress Notes (Signed)
History and Physical:  This patient presents for endoscopic testing for: Encounter Diagnosis  Name Primary?   Hx of colonic polyp Yes   Surveillance colonoscopy today. <17mm TA last colonoscopy Aug 2011 Patient denies chronic abdominal pain, rectal bleeding, constipation or diarrhea.    Patient is otherwise without complaints or active issues today.   Past Medical History: Past Medical History:  Diagnosis Date   Allergy    Diabetes mellitus without complication ([place])    HTN (hypertension)    Hyperlipidemia    Seasonal allergies    Sleep apnea      Past Surgical History: Past Surgical History:  Procedure Laterality Date   None      Allergies: Allergies  Allergen Reactions   Food Rash    pork   Dulaglutide Diarrhea   Penicillins     REACTION: swelling and itching    Outpatient Meds: Current Outpatient Medications  Medication Sig Dispense Refill   amLODipine (NORVASC) 10 MG tablet Take 1 tablet (10 mg total) by mouth daily. 90 tablet 3   clobetasol cream (TEMOVATE) 0.05 % Apply topically 2 times daily as needed 60 g 0   losartan-hydrochlorothiazide (HYZAAR) 100-12.5 MG tablet TAKE 1 TABLET BY MOUTH DAILY. 90 tablet 3   metFORMIN (GLUCOPHAGE-XR) 500 MG 24 hr tablet Take 2 tablets by mouth twice daily with a meal 360 tablet 3   Olopatadine HCl 0.2 % SOLN Place 1 drop into both eyes daily as needed for allergies 2.5 mL 11   rosuvastatin (CRESTOR) 20 MG tablet Take 1 tablet by mouth daily. Need to make appointment for further refills. 60 tablet 0   atovaquone-proguanil (MALARONE) 250-100 MG TABS tablet Take 1 tablet by mouth daily for 2 days prior to travel and continue daily until 7 days after return (Patient not taking: Reported on 10/20/2022) 60 tablet 0   blood glucose meter kit and supplies KIT Use up to four times daily as directed. Dispense based on pt & ins preference. Dx: E11.65, Z79.4 1 each 0   carvedilol (COREG CR) 10 MG 24 hr capsule Take 1 capsule by mouth  daily. (Patient not taking: Reported on 10/20/2022) 30 capsule 11   folic acid (FOLVITE) Q000111Q MCG tablet Take 400 mcg by mouth 2 (two) times daily.     glucose blood (ONETOUCH VERIO) test strip Use 2x a day 200 each 3   loratadine (CLARITIN) 10 MG tablet Take 1 tablet (10 mg total) by mouth daily. (Patient not taking: Reported on 11/16/2022) 30 tablet 11   montelukast (SINGULAIR) 10 MG tablet Take 1 tablet by mouth at bedtime. (Patient not taking: Reported on 10/20/2022) 30 tablet 11   ONE TOUCH LANCETS MISC Use 2x a day 200 each 3   Semaglutide, 1 MG/DOSE, (OZEMPIC, 1 MG/DOSE,) 4 MG/3ML SOPN Inject 1 mg into the skin once a week. 6 mL 5   tadalafil (CIALIS) 10 MG tablet Take 1 tablet by mouth daily as needed for erectile dysfunction. Appointment required for further refills 10 tablet 2   Zinc 50 MG CAPS Take 50 mg by mouth 2 (two) times daily as needed.     Current Facility-Administered Medications  Medication Dose Route Frequency Provider Last Rate Last Admin   0.9 %  sodium chloride infusion  500 mL Intravenous Once Nelida Meuse III, MD          ___________________________________________________________________ Objective   Exam:  BP (!) 135/108   Pulse 84   Temp 98.2 F (36.8 C)   Ht  5\' 6"  (1.676 m)   Wt 165 lb (74.8 kg)   SpO2 95%   BMI 26.63 kg/m   CV: regular , S1/S2 Resp: clear to auscultation bilaterally, normal RR and effort noted GI: soft, no tenderness, with active bowel sounds.   Assessment: Encounter Diagnosis  Name Primary?   Hx of colonic polyp Yes     Plan: Colonoscopy  The benefits and risks of the planned procedure were described in detail with the patient or (when appropriate) their health care proxy.  Risks were outlined as including, but not limited to, bleeding, infection, perforation, adverse medication reaction leading to cardiac or pulmonary decompensation, pancreatitis (if ERCP).  The limitation of incomplete mucosal visualization was also  discussed.  No guarantees or warranties were given.    The patient is appropriate for an endoscopic procedure in the ambulatory setting.   - Wilfrid Lund, MD

## 2022-11-16 NOTE — Progress Notes (Signed)
Report given to PACU, vss 

## 2022-11-16 NOTE — Progress Notes (Signed)
Pt's states no medical or surgical changes since previsit or office visit. 

## 2022-11-16 NOTE — Patient Instructions (Signed)
Await pathology results.  Resume previous diet. Continue present medications. Repeat colonoscopy in 2 years for surveillance.    YOU HAD AN ENDOSCOPIC PROCEDURE TODAY AT [facility]:   Refer to the procedure report that was given to you for any specific questions about what was found during the examination.  If the procedure report does not answer your questions, please call your gastroenterologist to clarify.  If you requested that your care partner not be given the details of your procedure findings, then the procedure report has been included in a sealed envelope for you to review at your convenience later.  YOU SHOULD EXPECT: Some feelings of bloating in the abdomen. Passage of more gas than usual.  Walking can help get rid of the air that was put into your GI tract during the procedure and reduce the bloating. If you had a lower endoscopy (such as a colonoscopy or flexible sigmoidoscopy) you may notice spotting of blood in your stool or on the toilet paper. If you underwent a bowel prep for your procedure, you may not have a normal bowel movement for a few days.  Please Note:  You might notice some irritation and congestion in your nose or some drainage.  This is from the oxygen used during your procedure.  There is no need for concern and it should clear up in a day or so.  SYMPTOMS TO REPORT IMMEDIATELY:  Following lower endoscopy (colonoscopy or flexible sigmoidoscopy):  Excessive amounts of blood in the stool  Significant tenderness or worsening of abdominal pains  Swelling of the abdomen that is new, acute  Fever of 100F or higher   For urgent or emergent issues, a gastroenterologist can be reached at any hour by calling [phone]. Do not use MyChart messaging for urgent concerns.    DIET:  We do recommend a small meal at first, but then you may proceed to your regular diet.  Drink plenty of fluids but you should avoid alcoholic beverages for 24  hours.  ACTIVITY:  You should plan to take it easy for the rest of today and you should NOT DRIVE or use heavy machinery until tomorrow (because of the sedation medicines used during the test).    FOLLOW UP: Our staff will call the number listed on your records the next business day following your procedure.  We will call around 7:15- 8:00 am to check on you and address any questions or concerns that you may have regarding the information given to you following your procedure. If we do not reach you, we will leave a message.     If any biopsies were taken you will be contacted by phone or by letter within the next 1-3 weeks.  Please call us at [phone] if you have not heard about the biopsies in 3 weeks.    SIGNATURES/CONFIDENTIALITY: You and/or your care partner have signed paperwork which will be entered into your electronic medical record.  These signatures attest to the fact that that the information above on your After Visit Summary has been reviewed and is understood.  Full responsibility of the confidentiality of this discharge information lies with you and/or your care-partner.

## 2022-11-17 ENCOUNTER — Telehealth: Payer: Self-pay | Admitting: *Deleted

## 2022-11-17 NOTE — Telephone Encounter (Signed)
  Follow up Call-     11/16/2022    2:03 PM  Call back number  Post procedure Call Back phone  # [phone]     Patient questions:  Do you have a fever, pain , or abdominal swelling? No. Pain Score  0 *  Have you tolerated food without any problems? Yes.    Have you been able to return to your normal activities? Yes.    Do you have any questions about your discharge instructions: Diet   No. Medications  No. Follow up visit  No.  Do you have questions or concerns about your Care? No.  Actions: * If pain score is 4 or above: No action needed, pain <4.

## 2022-11-19 ENCOUNTER — Encounter: Payer: Self-pay | Admitting: Gastroenterology

## 2022-12-03 ENCOUNTER — Telehealth: Payer: Self-pay | Admitting: Gastroenterology

## 2022-12-03 NOTE — Telephone Encounter (Signed)
Patient wanting to speak with a nurse he had a procedure on 3/25 and since then he said he is having a lose stools and discomfort..Please advise best time for patient 12:00 pm to 12:30 pm Thanks

## 2022-12-03 NOTE — Telephone Encounter (Signed)
Patient returned call, stated he was able to speak now.

## 2022-12-03 NOTE — Telephone Encounter (Signed)
Returned call to patient. Pt reports that he has been experiencing abdominal discomfort and loose since the day after his procedure. Pt reports that the abdominal discomfort does not resolve after a BM, he only finds relief after taking Apple Cider vinegar gummies. Pt reports that 2-3 hours after a meal he will pass about 3-4 loose stools. Pt has not tried Imodium OTC. Pt has also been cutting out foods that cause more diarrhea, he is eating spicy foods daily. I advised pt to try a bland diet and to start Align probiotic OTC. Pt has been advised to contact the office if he has continued symptoms despite recommendations. Pt verbalized understanding and had no concerns at the end of the call.

## 2022-12-03 NOTE — Telephone Encounter (Signed)
Returned call to patient. Pt states that he will give me a call back.

## 2023-02-03 ENCOUNTER — Ambulatory Visit (INDEPENDENT_AMBULATORY_CARE_PROVIDER_SITE_OTHER): Payer: BC Managed Care – PPO | Admitting: Internal Medicine

## 2023-02-03 ENCOUNTER — Encounter: Payer: Self-pay | Admitting: Internal Medicine

## 2023-02-03 VITALS — BP 130/88 | HR 88 | Resp 20 | Ht 65.25 in | Wt 165.5 lb

## 2023-02-03 DIAGNOSIS — Z79899 Other long term (current) drug therapy: Secondary | ICD-10-CM

## 2023-02-03 DIAGNOSIS — E785 Hyperlipidemia, unspecified: Secondary | ICD-10-CM | POA: Diagnosis not present

## 2023-02-03 DIAGNOSIS — Z0000 Encounter for general adult medical examination without abnormal findings: Secondary | ICD-10-CM

## 2023-02-03 DIAGNOSIS — E11319 Type 2 diabetes mellitus with unspecified diabetic retinopathy without macular edema: Secondary | ICD-10-CM | POA: Diagnosis not present

## 2023-02-03 DIAGNOSIS — G4733 Obstructive sleep apnea (adult) (pediatric): Secondary | ICD-10-CM

## 2023-02-03 DIAGNOSIS — N529 Male erectile dysfunction, unspecified: Secondary | ICD-10-CM

## 2023-02-03 DIAGNOSIS — D126 Benign neoplasm of colon, unspecified: Secondary | ICD-10-CM

## 2023-02-03 DIAGNOSIS — E1165 Type 2 diabetes mellitus with hyperglycemia: Secondary | ICD-10-CM

## 2023-02-03 DIAGNOSIS — Z125 Encounter for screening for malignant neoplasm of prostate: Secondary | ICD-10-CM

## 2023-02-03 DIAGNOSIS — I152 Hypertension secondary to endocrine disorders: Secondary | ICD-10-CM

## 2023-02-03 DIAGNOSIS — E1159 Type 2 diabetes mellitus with other circulatory complications: Secondary | ICD-10-CM

## 2023-02-03 MED ORDER — LOSARTAN POTASSIUM-HCTZ 100-12.5 MG PO TABS
1.0000 | ORAL_TABLET | Freq: Every day | ORAL | 3 refills | Status: DC
  Filled 2023-02-03 – 2023-03-04 (×2): qty 90, 90d supply, fill #0
  Filled 2023-06-11: qty 90, 90d supply, fill #1

## 2023-02-03 MED ORDER — AMLODIPINE BESYLATE 10 MG PO TABS
10.0000 mg | ORAL_TABLET | Freq: Every day | ORAL | 3 refills | Status: DC
  Filled 2023-02-03: qty 90, 90d supply, fill #0

## 2023-02-03 MED ORDER — LORATADINE 10 MG PO TABS
10.0000 mg | ORAL_TABLET | Freq: Every day | ORAL | 11 refills | Status: DC
  Filled 2023-02-03: qty 30, 30d supply, fill #0

## 2023-02-03 MED ORDER — ROSUVASTATIN CALCIUM 20 MG PO TABS
20.0000 mg | ORAL_TABLET | Freq: Every day | ORAL | 0 refills | Status: DC
  Filled 2023-02-03: qty 60, 60d supply, fill #0

## 2023-02-03 MED ORDER — METFORMIN HCL ER 500 MG PO TB24
1000.0000 mg | ORAL_TABLET | Freq: Two times a day (BID) | ORAL | 3 refills | Status: DC
  Filled 2023-02-03: qty 360, 90d supply, fill #0

## 2023-02-03 MED ORDER — TADALAFIL 10 MG PO TABS
10.0000 mg | ORAL_TABLET | Freq: Every day | ORAL | 2 refills | Status: DC | PRN
  Filled 2023-02-03 – 2023-03-04 (×2): qty 10, 10d supply, fill #0
  Filled 2023-06-11: qty 10, 10d supply, fill #1

## 2023-02-03 MED ORDER — FOLIC ACID 800 MCG PO TABS
400.0000 ug | ORAL_TABLET | Freq: Two times a day (BID) | ORAL | 3 refills | Status: DC
  Filled 2023-02-03: qty 90, 90d supply, fill #0

## 2023-02-03 NOTE — Progress Notes (Signed)
Subjective:    Patient ID: Danny Vazquez, male   DOB: 07/19/1961, 62 y.o.   MRN: [id number]   HPI  Here for Male CPE:  1.  STE:  Does perform, no concerning findings.  No family history of testicular cancer.   2.  PSA: Last checked in 2022 and normal at 0.4.  No family history of prostate cancer.    3.  Guaiac Cards/FIT:  Never.   4.  Colonoscopy: Second colonoscopy 11/16/22 with findings of 3 adenomas without high grade dysplasia.  Dr. Myrtie Neither.  Had one polyp in 2011.  Follow up planned for 10/2024  5.  Cholesterol/Glucose:  LDL not at goal in February.  His A1C was also back up to 9.6% Lipid Panel     Component Value Date/Time   CHOL 140 10/08/2022 0844   TRIG 111 10/08/2022 0844   HDL 46 10/08/2022 0844   CHOLHDL 3.4 03/26/2020 1602   CHOLHDL 3.9 05/27/2016 1549   VLDL 53 (H) 05/27/2016 1549   LDLCALC 74 10/08/2022 0844   LABVLDL 20 10/08/2022 0844    6.  Immunizations: Did not get influenza vaccine nor COVID. Immunization History  Administered Date(s) Administered   Influenza Whole 05/29/2009, 06/13/2010   Influenza,inj,Quad PF,6+ Mos 05/16/2014, 05/08/2015, 05/05/2016, 05/04/2017, 07/07/2018, 05/29/2019, 06/17/2020   PFIZER(Purple Top)SARS-COV-2 Vaccination 10/24/2019, 11/15/2019   Pfizer Covid-19 Vaccine Bivalent Booster 10yrs & up 05/27/2021   Pneumococcal Polysaccharide-23 07/11/2009   Td 08/29/2007   Tdap 09/26/2020     Current Meds  Medication Sig   amLODipine (NORVASC) 10 MG tablet Take 1 tablet (10 mg total) by mouth daily.   clobetasol cream (TEMOVATE) 0.05 % Apply topically 2 times daily as needed   folic acid (FOLVITE) 800 MCG tablet Take 400 mcg by mouth 2 (two) times daily.   loratadine (CLARITIN) 10 MG tablet Take 1 tablet (10 mg total) by mouth daily.   losartan-hydrochlorothiazide (HYZAAR) 100-12.5 MG tablet TAKE 1 TABLET BY MOUTH DAILY.   metFORMIN (GLUCOPHAGE-XR) 500 MG 24 hr tablet Take 2 tablets by mouth twice daily with a meal   Multiple  Vitamin (MULTIVITAMIN) tablet Take 1 tablet by mouth daily.   Olopatadine HCl 0.2 % SOLN Place 1 drop into both eyes daily as needed for allergies   rosuvastatin (CRESTOR) 20 MG tablet Take 1 tablet by mouth daily. Need to make appointment for further refills.   Semaglutide, 1 MG/DOSE, (OZEMPIC, 1 MG/DOSE,) 4 MG/3ML SOPN Inject 1 mg into the skin once a week.   tadalafil (CIALIS) 10 MG tablet Take 1 tablet by mouth daily as needed for erectile dysfunction. Appointment required for further refills   Zinc 50 MG CAPS Take 50 mg by mouth 2 (two) times daily as needed.   Allergies  Allergen Reactions   Food Rash    pork   Dulaglutide Diarrhea   Penicillins     REACTION: swelling and itching   Past Medical History:  Diagnosis Date   Diabetes mellitus without complication (HCC)    ED (erectile dysfunction)    HTN (hypertension)    Hyperlipidemia    Seasonal allergies    Sleep apnea    Past Surgical History:  Procedure Laterality Date   None     Family History  Problem Relation Age of Onset   Diabetes Mother    Hypertension Mother    Hypertension Father    Heart disease Father        CHF   Stomach cancer Brother    Cancer  Brother 58       Does not have all the info, but some sort of intestinal cancer.   Hypertension Brother    Diabetes Brother    Diabetes Brother    Hypertension Brother    Hypertension Brother    Diabetes Brother    Heart disease Brother        CHF   Hypertension Brother    Diabetes Brother    Diabetes Brother    Hypertension Brother    Hypertension Brother    Diabetes Brother    Diabetes Brother    Hypertension Brother    Hypertension Brother    Diabetes Brother    Colon cancer Neg Hx    Colon polyps Neg Hx    Esophageal cancer Neg Hx    Rectal cancer Neg Hx    Social History   Socioeconomic History   Marital status: Media planner    Spouse name: Renea Ee   Number of children: 4   Years of education: Not on file   Highest education level:  Master's degree (e.g., MA, MS, MEng, MEd, MSW, MBA)  Occupational History   Occupation: [employer]    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  Student at [employer]   2 daughters in [place] and 1 in [place]   Social Determinants of Health   Financial Resource Strain: Low Risk  (02/03/2023)   Overall Financial Resource Strain (CARDIA)    Difficulty of Paying Living Expenses: Not hard at all  Food Insecurity: No Food Insecurity (02/03/2023)   Hunger Vital Sign    Worried About Running Out of Food in the Last Year: Never true    Ran Out of Food in the Last Year: Never true  Transportation Needs: No Transportation Needs (02/03/2023)   PRAPARE - Administrator, Civil Service (Medical): No    Lack of Transportation (Non-Medical): No  Physical Activity: Not on file  Stress: Not on file  Social Connections: Not on file  Intimate Partner Violence: Not At Risk (02/03/2023)   Humiliation, Afraid, Rape, and Kick questionnaire    Fear of Current or Ex-Partner: No    Emotionally Abused: No    Physically Abused: No    Sexually Abused: No     Review of Systems  Eyes:  Negative for visual disturbance (Had appt with retinal specialist.  Not clear if new changes).  Respiratory:  Negative for shortness of breath.        Snoring.  Not using CPAP regularly.  Current machine is about 63 years old.    Cardiovascular:  Positive for leg swelling (Occasionally with travel). Negative for chest pain.  Gastrointestinal:  Negative for abdominal pain and blood in stool (no melena).  Genitourinary:  Negative for decreased urine volume, dysuria and frequency.   Musculoskeletal:  Negative for arthralgias.       Stiffness of fingers at times.  Skin:  Negative for rash.  Neurological:  Negative for weakness and numbness.  Psychiatric/Behavioral:  Negative for dysphoric mood. The patient is not nervous/anxious.  Objective:   BP 130/88 (BP Location: Left Arm, Patient Position: Sitting, Cuff Size: Normal)   Pulse 88   Resp 20   Ht 5' 5.25" (1.657 m)   Wt 165 lb 8 oz (75.1 kg)   BMI 27.33 kg/m   Physical Exam HENT:     Head: Normocephalic and atraumatic.     Right Ear: Tympanic membrane, ear canal and external ear normal.     Left Ear: Tympanic membrane, ear canal and external ear normal.     Nose: Nose normal.     Mouth/Throat:     Mouth: Mucous membranes are moist.     Pharynx: Oropharynx is clear.  Eyes:     Extraocular Movements: Extraocular movements intact.     Pupils: Pupils are equal, round, and reactive to light.     Comments: Discs appear sharp  Neck:     Thyroid: No thyroid mass or thyromegaly.  Cardiovascular:     Rate and Rhythm: Normal rate and regular rhythm.     Pulses:          Dorsalis pedis pulses are 2+ on the right side and 2+ on the left side.       Posterior tibial pulses are 2+ on the right side and 2+ on the left side.     Heart sounds: S1 normal and S2 normal. No murmur heard.    No friction rub. No S3 or S4 sounds.     Comments: No carotid bruits.  Carotid, radial, femoral, DP and PT pulses normal and equal.   Pulmonary:     Effort: Pulmonary effort is normal.     Breath sounds: Normal breath sounds and air entry.  Abdominal:     General: Bowel sounds are normal.     Palpations: Abdomen is soft. There is no hepatomegaly, splenomegaly or mass.     Tenderness: There is no abdominal tenderness.     Hernia: No hernia is present.  Genitourinary:    Penis: Normal.      Testes:        Right: Mass or tenderness not present. Right testis is descended.        Left: Mass or tenderness not present. Left  testis is descended.  Musculoskeletal:        General: Normal range of motion.     Cervical back: Normal range of motion and neck supple.     Right lower leg: No edema.     Left lower leg: No edema.  Feet:     Right foot:     Protective Sensation: 10 sites tested.  10 sites sensed.     Skin integrity: Skin integrity normal.     Toenail Condition: Right toenails are normal.     Left foot:     Protective Sensation: 10 sites tested.  10 sites sensed.     Skin integrity: Skin integrity normal.     Toenail Condition: Left toenails are normal.  Lymphadenopathy:     Head:     Right side of head: No submental or submandibular adenopathy.     Left side of head: No submental or submandibular adenopathy.     Cervical: No cervical adenopathy.     Upper Body:     Right upper body: No supraclavicular adenopathy.     Left upper body: No supraclavicular adenopathy.     Lower Body: No right inguinal adenopathy. No left inguinal adenopathy.  Skin:    General: Skin is warm.  Capillary Refill: Capillary refill takes less than 2 seconds.     Findings: No rash.  Neurological:     General: No focal deficit present.     Mental Status: He is alert and oriented to person, place, and time.     Cranial Nerves: Cranial nerves 2-12 are intact.     Sensory: Sensation is intact.     Motor: Motor function is intact.     Coordination: Coordination is intact.     Gait: Gait is intact.     Deep Tendon Reflexes: Reflexes are normal and symmetric.  Psychiatric:        Speech: Speech normal.        Behavior: Behavior normal. Behavior is cooperative.      Assessment & Plan   CPE Encouraged yearly influenza vaccine Encouraged current COVID booster--to obtain at his pharmacy PSA, CBC Next colonoscopy in 10/2024 with history of adenomas  2.  DM:  Has not been at goal with control and not following blood sugars.  A1C.  Urine microalbumin/crea.  Has had issues with remembering meds later in day in past.  Did  not tolerated Metformin all in one dose in morning.  States not missing meds often.  3.  Hypertension:  improved control today.  4.  Hyperlipidemia:  not at goal in past.  FLP  5.  Elevated crea:  CMP  6.  ED:  Cialis not helping.  Referral to Urology.  7.  OSA:   needs new equipment and old is over 48 years old.  Will need another sleep study.

## 2023-02-04 ENCOUNTER — Other Ambulatory Visit: Payer: Self-pay

## 2023-02-04 ENCOUNTER — Other Ambulatory Visit (HOSPITAL_COMMUNITY): Payer: Self-pay

## 2023-02-05 ENCOUNTER — Other Ambulatory Visit: Payer: Self-pay

## 2023-02-05 LAB — MICROALBUMIN / CREATININE URINE RATIO
Creatinine, Urine: 375.7 mg/dL
Microalb/Creat Ratio: 28 mg/g creat (ref 0–29)
Microalbumin, Urine: 104 ug/mL

## 2023-02-05 LAB — COMPREHENSIVE METABOLIC PANEL
ALT: 25 IU/L (ref 0–44)
AST: 24 IU/L (ref 0–40)
Albumin/Globulin Ratio: 1.4
Albumin: 4.4 g/dL (ref 3.9–4.9)
Alkaline Phosphatase: 104 IU/L (ref 44–121)
BUN/Creatinine Ratio: 15 (ref 10–24)
BUN: 21 mg/dL (ref 8–27)
Bilirubin Total: 0.8 mg/dL (ref 0.0–1.2)
CO2: 27 mmol/L (ref 20–29)
Calcium: 9.6 mg/dL (ref 8.6–10.2)
Chloride: 98 mmol/L (ref 96–106)
Creatinine, Ser: 1.42 mg/dL — ABNORMAL HIGH (ref 0.76–1.27)
Globulin, Total: 3.1 g/dL (ref 1.5–4.5)
Glucose: 169 mg/dL — ABNORMAL HIGH (ref 70–99)
Potassium: 4.3 mmol/L (ref 3.5–5.2)
Sodium: 138 mmol/L (ref 134–144)
Total Protein: 7.5 g/dL (ref 6.0–8.5)
eGFR: 56 mL/min/{1.73_m2} — ABNORMAL LOW (ref >59)

## 2023-02-05 LAB — CBC WITH DIFFERENTIAL/PLATELET
Basophils Absolute: 0.1 10*3/uL (ref 0.0–0.2)
Basos: 1 %
EOS (ABSOLUTE): 0.1 10*3/uL (ref 0.0–0.4)
Eos: 2 %
Hematocrit: 44.3 % (ref 37.5–51.0)
Hemoglobin: 14.7 g/dL (ref 13.0–17.7)
Immature Grans (Abs): 0 10*3/uL (ref 0.0–0.1)
Immature Granulocytes: 0 %
Lymphocytes Absolute: 2.8 10*3/uL (ref 0.7–3.1)
Lymphs: 54 %
MCH: 30.2 pg (ref 26.6–33.0)
MCHC: 33.2 g/dL (ref 31.5–35.7)
MCV: 91 fL (ref 79–97)
Monocytes Absolute: 0.4 10*3/uL (ref 0.1–0.9)
Monocytes: 8 %
Neutrophils Absolute: 1.8 10*3/uL (ref 1.4–7.0)
Neutrophils: 35 %
Platelets: 251 10*3/uL (ref 150–450)
RBC: 4.87 x10E6/uL (ref 4.14–5.80)
RDW: 12.8 % (ref 11.6–15.4)
WBC: 5.1 10*3/uL (ref 3.4–10.8)

## 2023-02-05 LAB — LIPID PANEL W/O CHOL/HDL RATIO
Cholesterol, Total: 154 mg/dL (ref 100–199)
HDL: 50 mg/dL (ref >39)
LDL Chol Calc (NIH): 87 mg/dL (ref 0–99)
Triglycerides: 90 mg/dL (ref 0–149)
VLDL Cholesterol Cal: 17 mg/dL (ref 5–40)

## 2023-02-05 LAB — PSA: Prostate Specific Ag, Serum: 0.4 ng/mL (ref 0.0–4.0)

## 2023-02-05 LAB — HGB A1C W/O EAG: Hgb A1c MFr Bld: 10.8 % — ABNORMAL HIGH (ref 4.8–5.6)

## 2023-02-09 ENCOUNTER — Ambulatory Visit (INDEPENDENT_AMBULATORY_CARE_PROVIDER_SITE_OTHER): Payer: BC Managed Care – PPO | Admitting: Internal Medicine

## 2023-02-09 DIAGNOSIS — E785 Hyperlipidemia, unspecified: Secondary | ICD-10-CM

## 2023-02-09 DIAGNOSIS — E1165 Type 2 diabetes mellitus with hyperglycemia: Secondary | ICD-10-CM | POA: Diagnosis not present

## 2023-02-09 DIAGNOSIS — Z23 Encounter for immunization: Secondary | ICD-10-CM

## 2023-02-09 NOTE — Progress Notes (Signed)
Discussed his A1C and LDL not at goal today.  He will work on lifestyle changes and not miss his evening meds.   Recheck labs in 3-4 months and if not at goal, will increase his Ozempic to 2 mg weekly and consider additions to his Rosuvastatin.

## 2023-02-12 ENCOUNTER — Other Ambulatory Visit (HOSPITAL_COMMUNITY): Payer: Self-pay

## 2023-02-22 DIAGNOSIS — H43811 Vitreous degeneration, right eye: Secondary | ICD-10-CM | POA: Diagnosis not present

## 2023-02-22 DIAGNOSIS — H43823 Vitreomacular adhesion, bilateral: Secondary | ICD-10-CM | POA: Diagnosis not present

## 2023-02-22 DIAGNOSIS — E113393 Type 2 diabetes mellitus with moderate nonproliferative diabetic retinopathy without macular edema, bilateral: Secondary | ICD-10-CM | POA: Diagnosis not present

## 2023-02-22 DIAGNOSIS — H35033 Hypertensive retinopathy, bilateral: Secondary | ICD-10-CM | POA: Diagnosis not present

## 2023-03-04 ENCOUNTER — Other Ambulatory Visit (HOSPITAL_COMMUNITY): Payer: Self-pay

## 2023-03-12 ENCOUNTER — Other Ambulatory Visit (HOSPITAL_COMMUNITY): Payer: Self-pay

## 2023-03-12 ENCOUNTER — Other Ambulatory Visit: Payer: Self-pay

## 2023-03-12 MED ORDER — SEMAGLUTIDE (2 MG/DOSE) 8 MG/3ML ~~LOC~~ SOPN
2.0000 mg | PEN_INJECTOR | SUBCUTANEOUS | 5 refills | Status: DC
  Filled 2023-03-12 – 2023-05-10 (×2): qty 3, 28d supply, fill #0
  Filled 2023-06-11: qty 3, 28d supply, fill #1
  Filled 2023-06-15: qty 3, 28d supply, fill #2

## 2023-03-12 NOTE — Telephone Encounter (Signed)
Patient needs next available appointment for a follow up

## 2023-03-23 ENCOUNTER — Other Ambulatory Visit (HOSPITAL_COMMUNITY): Payer: Self-pay

## 2023-03-29 ENCOUNTER — Other Ambulatory Visit (HOSPITAL_COMMUNITY): Payer: Self-pay

## 2023-04-20 ENCOUNTER — Ambulatory Visit
Admission: EM | Admit: 2023-04-20 | Discharge: 2023-04-20 | Disposition: A | Discharge status: Home / Self Care | Payer: Commercial Managed Care - PPO | Attending: Internal Medicine | Admitting: Internal Medicine

## 2023-04-20 ENCOUNTER — Other Ambulatory Visit (HOSPITAL_COMMUNITY): Payer: Self-pay

## 2023-04-20 DIAGNOSIS — B349 Viral infection, unspecified: Secondary | ICD-10-CM

## 2023-04-20 DIAGNOSIS — E119 Type 2 diabetes mellitus without complications: Secondary | ICD-10-CM | POA: Diagnosis not present

## 2023-04-20 DIAGNOSIS — Z1152 Encounter for screening for COVID-19: Secondary | ICD-10-CM | POA: Diagnosis not present

## 2023-04-20 DIAGNOSIS — R059 Cough, unspecified: Secondary | ICD-10-CM | POA: Diagnosis present

## 2023-04-20 DIAGNOSIS — Z794 Long term (current) use of insulin: Secondary | ICD-10-CM | POA: Insufficient documentation

## 2023-04-20 MED ORDER — LOPERAMIDE HCL 2 MG PO CAPS
2.0000 mg | ORAL_CAPSULE | Freq: Two times a day (BID) | ORAL | 0 refills | Status: DC | PRN
  Filled 2023-04-20: qty 14, 7d supply, fill #0

## 2023-04-20 MED ORDER — PROMETHAZINE-DM 6.25-15 MG/5ML PO SYRP
5.0000 mL | ORAL_SOLUTION | Freq: Three times a day (TID) | ORAL | 0 refills | Status: DC | PRN
  Filled 2023-04-20: qty 200, 14d supply, fill #0

## 2023-04-20 MED ORDER — ONDANSETRON 4 MG PO TBDP
4.0000 mg | ORAL_TABLET | Freq: Three times a day (TID) | ORAL | 0 refills | Status: DC | PRN
  Filled 2023-04-20: qty 20, 7d supply, fill #0

## 2023-04-20 NOTE — ED Provider Notes (Signed)
[facility]  Note:  This document was prepared using Conservation officer, historic buildings and may include unintentional dictation errors.  MRN: [id number] DOB: [birth year]  Subjective:   Danny Vazquez is a 62 y.o. male presenting for 3-4 day history of persistent body pains, loose stools, started having a fever and dry cough yesterday night.  No chest pain, shortness of breath or wheezing.  No asthma.  Patient does have diabetes managed with insulin.  No smoking of any kind including cigarettes, cigars, vaping, marijuana use.  Patient tries to stay active and healthy.  Would like a COVID test.  No current facility-administered medications for this encounter.  Current Outpatient Medications:    amLODipine (NORVASC) 10 MG tablet, Take 1 tablet (10 mg total) by mouth daily., Disp: 90 tablet, Rfl: 3   blood glucose meter kit and supplies KIT, Use up to four times daily as directed. Dispense based on pt & ins preference. Dx: E11.65, Z79.4 (Patient not taking: Reported on 02/03/2023), Disp: 1 each, Rfl: 0   clobetasol cream (TEMOVATE) 0.05 %, Apply topically 2 times daily as needed, Disp: 60 g, Rfl: 0   folic acid (FOLVITE) 800 MCG tablet, Take 0.5 tablets (400 mcg total) by mouth 2 (two) times daily., Disp: 90 tablet, Rfl: 3   glucose blood (ONETOUCH VERIO) test strip, Use 2x a day (Patient not taking: Reported on 02/03/2023), Disp: 200 each, Rfl: 3   loratadine (CLARITIN) 10 MG tablet, Take 1 tablet (10 mg total) by mouth daily., Disp: 30 tablet, Rfl: 11   losartan-hydrochlorothiazide (HYZAAR) 100-12.5 MG tablet, Take 1 tablet by mouth daily., Disp: 90 tablet, Rfl: 3   metFORMIN (GLUCOPHAGE-XR) 500 MG 24 hr tablet, Take 2 tablets (1,000 mg total) by mouth 2 (two) times daily with a meal., Disp: 360 tablet, Rfl: 3   Multiple Vitamin (MULTIVITAMIN) tablet, Take 1 tablet by mouth daily., Disp: , Rfl:    Olopatadine HCl 0.2 % SOLN, Place 1 drop into both eyes daily as needed for  allergies, Disp: 2.5 mL, Rfl: 11   ONE TOUCH LANCETS MISC, Use 2x a day (Patient not taking: Reported on 02/03/2023), Disp: 200 each, Rfl: 3   rosuvastatin (CRESTOR) 20 MG tablet, Take 1 tablet by mouth daily. Need to make appointment for further refills., Disp: 60 tablet, Rfl: 0   Semaglutide, 2 MG/DOSE, 8 MG/3ML SOPN, Inject 2 mg as directed once a week., Disp: 3 mL, Rfl: 5   tadalafil (CIALIS) 10 MG tablet, Take 1 tablet by mouth daily as needed for erectile dysfunction. Appointment required for further refills, Disp: 10 tablet, Rfl: 2   Zinc 50 MG CAPS, Take 50 mg by mouth 2 (two) times daily as needed., Disp: , Rfl:    Allergies  Allergen Reactions   Food Rash    pork   Dulaglutide Diarrhea   Penicillins     REACTION: swelling and itching    Past Medical History:  Diagnosis Date   Diabetes mellitus without complication (HCC)    ED (erectile dysfunction)    HTN (hypertension)    Hyperlipidemia    Seasonal allergies    Sleep apnea      Past Surgical History:  Procedure Laterality Date   None      Family History  Problem Relation Age of Onset   Diabetes Mother    Hypertension Mother    Hypertension Father    Heart disease Father        CHF   Stomach cancer  Brother    Cancer Brother 88       Does not have all the info, but some sort of intestinal cancer.   Hypertension Brother    Diabetes Brother    Diabetes Brother    Hypertension Brother    Hypertension Brother    Diabetes Brother    Heart disease Brother        CHF   Hypertension Brother    Diabetes Brother    Diabetes Brother    Hypertension Brother    Hypertension Brother    Diabetes Brother    Diabetes Brother    Hypertension Brother    Hypertension Brother    Diabetes Brother    Colon cancer Neg Hx    Colon polyps Neg Hx    Esophageal cancer Neg Hx    Rectal cancer Neg Hx     Social History   Tobacco Use   Smoking status: Never    Passive exposure: Never   Smokeless tobacco: Never  Vaping  Use   Vaping status: Never Used  Substance Use Topics   Alcohol use: Yes    Comment: occasionally   Drug use: Never    ROS   Objective:   Vitals: BP 133/86 (BP Location: Right Arm)   Pulse 89   Temp 98.1 F (36.7 C) (Oral)   Resp 20   SpO2 97%   Physical Exam Constitutional:      General: He is not in acute distress.    Appearance: Normal appearance. He is well-developed and normal weight. He is not ill-appearing, toxic-appearing or diaphoretic.  HENT:     Head: Normocephalic and atraumatic.     Right Ear: Tympanic membrane, ear canal and external ear normal. No drainage, swelling or tenderness. No middle ear effusion. There is no impacted cerumen. Tympanic membrane is not erythematous or bulging.     Left Ear: Tympanic membrane, ear canal and external ear normal. No drainage, swelling or tenderness.  No middle ear effusion. There is no impacted cerumen. Tympanic membrane is not erythematous or bulging.     Nose: Nose normal. No congestion or rhinorrhea.     Mouth/Throat:     Mouth: Mucous membranes are moist.     Pharynx: No oropharyngeal exudate or posterior oropharyngeal erythema.  Eyes:     General: No scleral icterus.       Right eye: No discharge.        Left eye: No discharge.     Extraocular Movements: Extraocular movements intact.     Conjunctiva/sclera: Conjunctivae normal.  Cardiovascular:     Rate and Rhythm: Normal rate and regular rhythm.     Heart sounds: Normal heart sounds. No murmur heard.    No friction rub. No gallop.  Pulmonary:     Effort: Pulmonary effort is normal. No respiratory distress.     Breath sounds: Normal breath sounds. No stridor. No wheezing, rhonchi or rales.  Abdominal:     General: Bowel sounds are normal. There is no distension.     Palpations: Abdomen is soft. There is no mass.     Tenderness: There is no abdominal tenderness. There is no right CVA tenderness, left CVA tenderness, guarding or rebound.  Musculoskeletal:      Cervical back: Normal range of motion and neck supple. No rigidity. No muscular tenderness.  Skin:    General: Skin is warm and dry.  Neurological:     General: No focal deficit present.     Mental Status: He  is alert and oriented to person, place, and time.  Psychiatric:        Mood and Affect: Mood normal.        Behavior: Behavior normal.        Thought Content: Thought content normal.     Assessment and Plan :   PDMP not reviewed this encounter.  1. Acute viral syndrome    Deferred imaging given clear cardiopulmonary exam, hemodynamically stable vital signs. Will manage for viral illness such as viral URI, viral syndrome, viral rhinitis, COVID-19. Recommended supportive care. Offered scripts for symptomatic relief. Testing is pending. Counseled patient on potential for adverse effects with medications prescribed/recommended today, ER and return-to-clinic precautions discussed, patient verbalized understanding.   Patient does not need to undergo COVID antivirals.  He was in agreement.   Wallis Bamberg, PA-C 04/20/23 1239

## 2023-04-20 NOTE — ED Triage Notes (Signed)
Pt c/o body aches, diarrhea last week-fever, dry cough started last night-NAD-steady gait

## 2023-04-20 NOTE — Discharge Instructions (Signed)
We will notify you of your test results as they arrive and may take between about 24 hours.  I encourage you to sign up for MyChart if you have not already done so as this can be the easiest way for [place] to communicate results to you online or through a phone app.  Generally, we only contact you if it is a positive test result.  In the meantime, if you develop worsening symptoms including fever, chest pain, shortness of breath despite our current treatment plan then please report to the emergency room as this may be a sign of worsening status from possible viral infection.  Otherwise, we will manage this as a viral syndrome. For sore throat or cough try using a honey-based tea. Use 3 teaspoons of honey with juice squeezed from half lemon. Place shaved pieces of ginger into 1/2-1 cup of water and warm over stove top. Then mix the ingredients and repeat every 4 hours as needed. Please take Tylenol 500mg -650mg  every 6 hours for aches and pains, fevers. Hydrate very well with at least 2 liters of water. Eat light meals such as soups to replenish electrolytes and soft fruits, veggies. Start an antihistamine like Zyrtec (10mg  daily) for postnasal drainage, sinus congestion.  You can take this together with pseudoephedrine (Sudafed) at a dose of 60 mg 2-3 times a day as needed for the same kind of congestion.  Use the cough medications as needed. Ondansetron can help with nausea and vomiting, loperamide can help with diarrhea.

## 2023-04-21 LAB — SARS CORONAVIRUS 2 (TAT 6-24 HRS): SARS Coronavirus 2: NEGATIVE

## 2023-04-30 ENCOUNTER — Other Ambulatory Visit (HOSPITAL_COMMUNITY): Payer: Self-pay

## 2023-05-10 ENCOUNTER — Other Ambulatory Visit (HOSPITAL_COMMUNITY): Payer: Self-pay

## 2023-05-12 ENCOUNTER — Other Ambulatory Visit (INDEPENDENT_AMBULATORY_CARE_PROVIDER_SITE_OTHER): Payer: BC Managed Care – PPO

## 2023-05-12 DIAGNOSIS — E1165 Type 2 diabetes mellitus with hyperglycemia: Secondary | ICD-10-CM

## 2023-05-13 LAB — HEMOGLOBIN A1C
Est. average glucose Bld gHb Est-mCnc: 263 mg/dL
Hgb A1c MFr Bld: 10.8 % — ABNORMAL HIGH (ref 4.8–5.6)

## 2023-06-11 ENCOUNTER — Other Ambulatory Visit (HOSPITAL_COMMUNITY): Payer: Self-pay

## 2023-06-15 ENCOUNTER — Other Ambulatory Visit (HOSPITAL_COMMUNITY): Payer: Self-pay

## 2023-06-23 ENCOUNTER — Other Ambulatory Visit (HOSPITAL_COMMUNITY): Payer: Self-pay

## 2023-06-23 MED ORDER — FLULAVAL 0.5 ML IM SUSY
0.5000 mL | PREFILLED_SYRINGE | INTRAMUSCULAR | 0 refills | Status: DC
  Filled 2023-06-23: qty 0.5, 1d supply, fill #0

## 2023-07-07 ENCOUNTER — Ambulatory Visit: Payer: BC Managed Care – PPO | Admitting: Internal Medicine

## 2023-07-07 ENCOUNTER — Encounter: Payer: Self-pay | Admitting: Internal Medicine

## 2023-07-07 VITALS — BP 150/100 | HR 84 | Resp 16 | Ht 65.25 in | Wt 168.0 lb

## 2023-07-07 DIAGNOSIS — E1159 Type 2 diabetes mellitus with other circulatory complications: Secondary | ICD-10-CM | POA: Diagnosis not present

## 2023-07-07 DIAGNOSIS — E785 Hyperlipidemia, unspecified: Secondary | ICD-10-CM

## 2023-07-07 DIAGNOSIS — Z23 Encounter for immunization: Secondary | ICD-10-CM | POA: Diagnosis not present

## 2023-07-07 DIAGNOSIS — E1165 Type 2 diabetes mellitus with hyperglycemia: Secondary | ICD-10-CM

## 2023-07-07 DIAGNOSIS — I152 Hypertension secondary to endocrine disorders: Secondary | ICD-10-CM

## 2023-07-07 DIAGNOSIS — Z91148 Patient's other noncompliance with medication regimen for other reason: Secondary | ICD-10-CM

## 2023-07-07 NOTE — Progress Notes (Signed)
Subjective:    Patient ID: Danny Vazquez, male   DOB: February 21, 1961, 62 y.o.   MRN: [id number]   HPI  Not clear what he is doing with meds.   States wife goes to pharmacy and asks for his meds, but they do not check to see if he is getting everything.   Has many refills left on meds where it looks like he has not picked up at all, including Amlodipine and Metformin.   He just switched over to the 2 mg weekly of Ozempic 3 weeks ago and should have occurred in July.    DM:  A1C was still at 10.8% in September after starting Ozempic.  No weight loss.  It appears he only filled ozempic once.    2.  Hypertension:  BP remains high.  Appears he is missing Amlodipine and missed a fill of Losartan-hydrochlorothiazide  3.  Hyperlipidemia:  Cholesterol panel not bad in June, though LDL a bit high at 87.    Current Meds  Medication Sig   amLODipine (NORVASC) 10 MG tablet Take 1 tablet (10 mg total) by mouth daily.   clobetasol cream (TEMOVATE) 0.05 % Apply topically 2 times daily as needed   folic acid (FOLVITE) 800 MCG tablet Take 0.5 tablets (400 mcg total) by mouth 2 (two) times daily.   losartan-hydrochlorothiazide (HYZAAR) 100-12.5 MG tablet Take 1 tablet by mouth daily.   metFORMIN (GLUCOPHAGE-XR) 500 MG 24 hr tablet Take 2 tablets (1,000 mg total) by mouth 2 (two) times daily with a meal.   Multiple Vitamin (MULTIVITAMIN) tablet Take 1 tablet by mouth daily.   Olopatadine HCl 0.2 % SOLN Place 1 drop into both eyes daily as needed for allergies   rosuvastatin (CRESTOR) 20 MG tablet Take 1 tablet by mouth daily. Need to make appointment for further refills.   Semaglutide, 2 MG/DOSE, 8 MG/3ML SOPN Inject 2 mg as directed once a week.   tadalafil (CIALIS) 10 MG tablet Take 1 tablet by mouth daily as needed for erectile dysfunction. Appointment required for further refills   Zinc 50 MG CAPS Take 50 mg by mouth 2 (two) times daily as needed.   Allergies  Allergen Reactions   Food Rash     pork   Dulaglutide Diarrhea   Penicillins     REACTION: swelling and itching     Review of Systems    Objective:   BP (!) 150/100 (BP Location: Right Arm, Patient Position: Sitting, Cuff Size: Normal)   Pulse 84   Resp 16   Ht 5' 5.25" (1.657 m)   Wt 168 lb (76.2 kg)   BMI 27.74 kg/m   Physical Exam HENT:     Head: Normocephalic and atraumatic.     Right Ear: Tympanic membrane normal.     Left Ear: Tympanic membrane normal.     Mouth/Throat:     Mouth: Mucous membranes are moist.     Pharynx: Oropharynx is clear.  Eyes:     Extraocular Movements: Extraocular movements intact.     Conjunctiva/sclera: Conjunctivae normal.     Pupils: Pupils are equal, round, and reactive to light.  Cardiovascular:     Rate and Rhythm: Normal rate and regular rhythm.     Pulses:          Dorsalis pedis pulses are 2+ on the right side and 2+ on the left side.       Posterior tibial pulses are 2+ on the right side and 2+ on the  left side.     Heart sounds: S1 normal and S2 normal. No murmur heard.    No friction rub. No S3 or S4 sounds.     Comments: No carotid bruits.  Carotid, radial, femoral, DP and PT pulses normal and equal.   Pulmonary:     Effort: Pulmonary effort is normal.     Breath sounds: Normal breath sounds.  Abdominal:     General: Bowel sounds are normal.     Palpations: Abdomen is soft.     Tenderness: There is no abdominal tenderness.  Musculoskeletal:     Cervical back: Normal range of motion and neck supple.     Right lower leg: No edema.     Left lower leg: No edema.  Feet:     Right foot:     Protective Sensation: 10 sites tested.  10 sites sensed.     Skin integrity: Skin integrity normal.     Toenail Condition: Right toenails are normal.     Left foot:     Protective Sensation: 10 sites tested.  10 sites sensed.     Skin integrity: Skin integrity normal.     Toenail Condition: Left toenails are normal.  Skin:    General: Skin is warm.     Capillary  Refill: Capillary refill takes less than 2 seconds.  Neurological:     Mental Status: He is alert and oriented to person, place, and time.      Assessment & Plan   DM//Hypertension/hyperlipidemia:  poor compliance with meds and thus, poor control.  Not clear if he is in denial with need to control chronic health issues, but states he is certain he is taking all his meds.  A call to the pharmacy and found out he has not filled Rosuvastatin nor amlodipine for folic acid since February of this year and has never filled Metformin.  He has filled Losartan/hydrochlorothiazide in July and then October and [facility] in September and October.  He had [employer], so is required to get meds at [facility].  They do have ATP/bubble packs there.    2.  HM:  Shingrix #2/2 He will contemplate COVID booster--encouraged him to do so.

## 2023-07-08 ENCOUNTER — Telehealth: Payer: Self-pay

## 2023-07-08 ENCOUNTER — Other Ambulatory Visit (HOSPITAL_COMMUNITY): Payer: Self-pay

## 2023-07-08 NOTE — Telephone Encounter (Signed)
Spoke to pharmacy and the following are his last fill dates for medications: Losartan-Hztc: 06/11/23, 03/04/23 Amlodipine: 09/28/22 Semaglutide: 06/11/23, 05/10/23 Folic acid: never Metformin: never Rosuvastatin: 10/20/22

## 2023-07-12 ENCOUNTER — Other Ambulatory Visit: Payer: Self-pay

## 2023-07-12 ENCOUNTER — Other Ambulatory Visit (HOSPITAL_COMMUNITY): Payer: Self-pay

## 2023-07-12 DIAGNOSIS — Z91148 Patient's other noncompliance with medication regimen for other reason: Secondary | ICD-10-CM | POA: Insufficient documentation

## 2023-07-12 MED ORDER — OLOPATADINE HCL 0.2 % OP SOLN
1.0000 [drp] | Freq: Every day | OPHTHALMIC | 11 refills | Status: AC | PRN
  Filled 2023-07-12 (×2): qty 2.5, 50d supply, fill #0

## 2023-07-12 MED ORDER — TADALAFIL 10 MG PO TABS
10.0000 mg | ORAL_TABLET | Freq: Every day | ORAL | 6 refills | Status: DC | PRN
  Filled 2023-07-12 (×2): qty 6, 30d supply, fill #0
  Filled 2024-01-29: qty 30, 30d supply, fill #1
  Filled 2024-05-10: qty 30, 30d supply, fill #2

## 2023-07-12 MED ORDER — FOLIC ACID 800 MCG PO TABS
800.0000 ug | ORAL_TABLET | Freq: Every day | ORAL | 11 refills | Status: DC
  Filled 2023-07-12: qty 60, 60d supply, fill #0
  Filled 2023-07-12: qty 30, 30d supply, fill #0
  Filled 2023-08-09 (×2): qty 30, 30d supply, fill #1
  Filled 2023-08-27 – 2023-09-02 (×2): qty 30, 30d supply, fill #2
  Filled 2023-09-27 – 2023-11-05 (×4): qty 30, 30d supply, fill #3
  Filled 2023-12-08 – 2024-05-03 (×5): qty 30, 30d supply, fill #4

## 2023-07-12 MED ORDER — AMLODIPINE BESYLATE 10 MG PO TABS
10.0000 mg | ORAL_TABLET | Freq: Every day | ORAL | 11 refills | Status: DC
  Filled 2023-07-12 (×2): qty 30, 30d supply, fill #0
  Filled 2023-08-09: qty 30, 30d supply, fill #1
  Filled 2023-08-27 – 2023-09-02 (×2): qty 30, 30d supply, fill #2
  Filled 2023-09-27 – 2023-11-05 (×4): qty 30, 30d supply, fill #3
  Filled 2023-12-08 – 2024-01-01 (×2): qty 30, 30d supply, fill #4
  Filled 2024-05-02: qty 30, 30d supply, fill #5

## 2023-07-12 MED ORDER — LORATADINE 10 MG PO TABS
10.0000 mg | ORAL_TABLET | Freq: Every day | ORAL | 11 refills | Status: AC | PRN
  Filled 2023-07-12 (×2): qty 30, 30d supply, fill #0
  Filled 2023-08-09: qty 30, 30d supply, fill #1
  Filled 2023-08-27 – 2023-09-02 (×4): qty 30, 30d supply, fill #2
  Filled 2023-09-27 – 2023-10-19 (×3): qty 30, 30d supply, fill #3

## 2023-07-12 MED ORDER — METFORMIN HCL ER 500 MG PO TB24
1000.0000 mg | ORAL_TABLET | Freq: Two times a day (BID) | ORAL | 11 refills | Status: DC
  Filled 2023-07-12 (×2): qty 120, 30d supply, fill #0
  Filled 2023-08-09: qty 120, 30d supply, fill #1
  Filled 2023-08-27 – 2023-09-02 (×2): qty 120, 30d supply, fill #2
  Filled 2023-09-27 – 2023-10-19 (×3): qty 120, 30d supply, fill #3

## 2023-07-12 MED ORDER — ROSUVASTATIN CALCIUM 20 MG PO TABS
20.0000 mg | ORAL_TABLET | Freq: Every day | ORAL | 0 refills | Status: DC
  Filled 2023-07-12 (×2): qty 30, 30d supply, fill #0
  Filled 2023-08-09: qty 30, 30d supply, fill #1

## 2023-07-12 MED ORDER — LOSARTAN POTASSIUM-HCTZ 100-12.5 MG PO TABS
1.0000 | ORAL_TABLET | Freq: Every day | ORAL | 11 refills | Status: DC
  Filled 2023-07-12 – 2023-09-02 (×4): qty 30, 30d supply, fill #0
  Filled 2023-09-27 – 2023-11-05 (×4): qty 30, 30d supply, fill #1
  Filled 2023-12-08 – 2024-01-01 (×2): qty 30, 30d supply, fill #2

## 2023-07-12 MED ORDER — SEMAGLUTIDE (2 MG/DOSE) 8 MG/3ML ~~LOC~~ SOPN
2.0000 mg | PEN_INJECTOR | SUBCUTANEOUS | 5 refills | Status: DC
  Filled 2023-07-12 (×2): qty 3, 28d supply, fill #0
  Filled 2023-09-08: qty 3, 28d supply, fill #1
  Filled 2023-09-27 – 2023-11-05 (×2): qty 3, 28d supply, fill #2
  Filled 2023-11-06: qty 9, 84d supply, fill #2

## 2023-07-13 ENCOUNTER — Other Ambulatory Visit: Payer: Self-pay

## 2023-07-13 ENCOUNTER — Other Ambulatory Visit (HOSPITAL_COMMUNITY): Payer: Self-pay

## 2023-07-14 ENCOUNTER — Other Ambulatory Visit: Payer: Self-pay

## 2023-07-19 ENCOUNTER — Other Ambulatory Visit (HOSPITAL_COMMUNITY): Payer: Self-pay

## 2023-07-20 ENCOUNTER — Other Ambulatory Visit: Payer: Self-pay

## 2023-07-20 NOTE — Telephone Encounter (Signed)
Patient has been scheduled

## 2023-07-25 DIAGNOSIS — K8020 Calculus of gallbladder without cholecystitis without obstruction: Secondary | ICD-10-CM

## 2023-07-25 HISTORY — DX: Calculus of gallbladder without cholecystitis without obstruction: K80.20

## 2023-08-04 ENCOUNTER — Other Ambulatory Visit: Payer: BC Managed Care – PPO

## 2023-08-04 DIAGNOSIS — E1165 Type 2 diabetes mellitus with hyperglycemia: Secondary | ICD-10-CM | POA: Diagnosis not present

## 2023-08-05 LAB — HEMOGLOBIN A1C
Est. average glucose Bld gHb Est-mCnc: 177 mg/dL
Hgb A1c MFr Bld: 7.8 % — ABNORMAL HIGH (ref 4.8–5.6)

## 2023-08-06 ENCOUNTER — Other Ambulatory Visit: Payer: BC Managed Care – PPO

## 2023-08-09 ENCOUNTER — Other Ambulatory Visit: Payer: Self-pay

## 2023-08-10 ENCOUNTER — Encounter: Payer: Self-pay | Admitting: Internal Medicine

## 2023-08-10 ENCOUNTER — Ambulatory Visit: Payer: BC Managed Care – PPO | Admitting: Internal Medicine

## 2023-08-10 ENCOUNTER — Other Ambulatory Visit: Payer: Self-pay

## 2023-08-10 ENCOUNTER — Other Ambulatory Visit (HOSPITAL_COMMUNITY): Payer: Self-pay

## 2023-08-10 VITALS — BP 156/100 | HR 88 | Resp 16 | Ht 65.25 in | Wt 163.0 lb

## 2023-08-10 DIAGNOSIS — I152 Hypertension secondary to endocrine disorders: Secondary | ICD-10-CM

## 2023-08-10 DIAGNOSIS — R109 Unspecified abdominal pain: Secondary | ICD-10-CM

## 2023-08-10 DIAGNOSIS — E1159 Type 2 diabetes mellitus with other circulatory complications: Secondary | ICD-10-CM | POA: Diagnosis not present

## 2023-08-10 DIAGNOSIS — E1165 Type 2 diabetes mellitus with hyperglycemia: Secondary | ICD-10-CM

## 2023-08-10 DIAGNOSIS — R1032 Left lower quadrant pain: Secondary | ICD-10-CM | POA: Diagnosis not present

## 2023-08-10 DIAGNOSIS — R310 Gross hematuria: Secondary | ICD-10-CM | POA: Diagnosis not present

## 2023-08-10 LAB — POCT URINALYSIS DIPSTICK
Bilirubin, UA: NEGATIVE
Blood, UA: NEGATIVE
Glucose, UA: NEGATIVE
Ketones, UA: NEGATIVE
Leukocytes, UA: NEGATIVE
Nitrite, UA: NEGATIVE
Protein, UA: NEGATIVE
Spec Grav, UA: >=1.03 — AB (ref 1.010–1.025)
Urobilinogen, UA: 0.2 U/dL
pH, UA: 6.5 (ref 5.0–8.0)

## 2023-08-10 MED ORDER — CARETOUCH VERSA BP ARM MONITOR DEVI
0 refills | Status: AC
  Filled 2023-08-10: qty 1, 30d supply, fill #0

## 2023-08-10 NOTE — Patient Instructions (Addendum)
Call if another episode of low back pain and bloody show Please bring in current pill pack to your appointment for BP as well.

## 2023-08-10 NOTE — Progress Notes (Signed)
 Subjective:    Patient ID: Danny Vazquez, male   DOB: 08/08/1961, 62 y.o.   MRN: [id number]   HPI  He did receive the packs for his meds from the [facility].  He has missed his meds only on one occasion when dozed off at night before taking his dinnertime meds.  Has had the packs for about 3 weeks.     Hypertension:  reportedly consistent with meds for past 3 weeks.  He has been on once daily Carvedilol, but feels it cause repeated fecal incontinence and does not want to try again.  States all of the "lol" meds or beta blockers caused similar issues with him.    2.  DM:  Ozempic increased to 2 mg one month ago.  A1C down markedly to 7.8%.  Has lost 5 lbs since November  3.  Awakened with pain in left lower back 2 weeks ago.  Lasted whole day.  The day after, he noted in the early evening after a nap, that he had bloody fluid on his "long johns" as if bloody urine spot--states was only 2 inches in diameter on his left thigh--and he noted blood on the glans of his penis and noted coming from urethral orifice.  He did not note any gross hematuria when he urinated prior or subsequent to finding the blood on his penis.  States on the penis, the bloody show was sticky like would expect with drying ejaculate. He had not had sex that day.     No testicular  No dysuria.  No itching or burning of glans.   He has never had a kidney stone, but does remember the night before the left low back pain that when he got up to urinate in the dark, he felt a heaviness with his urine with the onset of urination.  No pain, however.   No history of heavy exercise or lifting in the days preceding this.   He is concerned this is from the increase in Ozempic. No recurrence of this since 2 weeks ago.       Current Meds  Medication Sig   amLODipine (NORVASC) 10 MG tablet Take 1 tablet (10 mg total) by mouth with evening meal   clobetasol cream (TEMOVATE) 0.05 % Apply topically 2 times daily as needed   folic  acid (FOLVITE) 800 MCG tablet Take 1 tablet (800 mcg total) by mouth daily with morning meal   loratadine (CLARITIN) 10 MG tablet Take 1 tablet (10 mg total) by mouth daily as needed for allergies   losartan-hydrochlorothiazide (HYZAAR) 100-12.5 MG tablet Take 1 tablet by mouth daily with breakfast   metFORMIN (GLUCOPHAGE-XR) 500 MG 24 hr tablet Take 2 tablets (1,000 mg total) by mouth 2 (two) times daily with breakfast and evening meals   Multiple Vitamin (MULTIVITAMIN) tablet Take 1 tablet by mouth daily.   Olopatadine HCl 0.2 % SOLN Place 1 drop into both eyes daily as needed for allergies.   rosuvastatin (CRESTOR) 20 MG tablet Take 1 tablet (20 mg total) by mouth daily with evening meal.   Semaglutide, 2 MG/DOSE, 8 MG/3ML SOPN Inject 2 mg as directed once a week.   tadalafil (CIALIS) 10 MG tablet Take 1 tablet by mouth daily as needed for erectile dysfunction. Appointment required for further refills   Zinc 50 MG CAPS Take 50 mg by mouth 2 (two) times daily as needed.   Allergies  Allergen Reactions   Food Rash    pork  Dulaglutide Diarrhea   Penicillins     REACTION: swelling and itching     Review of Systems    Objective:   BP (!) 156/100 (BP Location: Right Arm, Patient Position: Sitting, Cuff Size: Normal)   Pulse 88   Resp 16   Ht 5' 5.25" (1.657 m)   Wt 163 lb (73.9 kg)   BMI 26.92 kg/m   Physical Exam NAD Lungs:  CTA CV:  RRR without murmur or rub.  Radial pulses normal and equal Abd:  no flank or suprapubic tenderness.  + BS.  NT, No HSM or mass.  GU:  circumcised.  No inflammation of urethral orifice or glans.  NT with palpation of penile shaft and no penile discharge.  No testicular mass or tenderness.  No inguinal hernia.     Assessment & Plan   DM:  improved control with increase of Ozempic and likely better consistency of taking meds now in pill packs.  2.  Hypertension:  remains high.  Again, now with pill packs, but not clear he is consistent yet with  meds.  Repeat BP check in 1 month.  Given a home monitor to get better idea of more relaxed bp setting.  To bring in measurements along with latest pill pack to bp check.  He does not want a beta blocker due to history of fecal incontinence with them in past.  Consider hydralazine if BPs are not decreasing and appears to be compliant with meds.    3.  Episode of low left back pain--not clear if flank as points to lower area and bloody show at penis without recurrence.  Check UA, urine GC/chlamydia, urine culture and BMP.  Also CT for renal stone.

## 2023-08-11 LAB — BASIC METABOLIC PANEL
BUN/Creatinine Ratio: 14 (ref 10–24)
BUN: 19 mg/dL (ref 8–27)
CO2: 23 mmol/L (ref 20–29)
Calcium: 9.2 mg/dL (ref 8.6–10.2)
Chloride: 104 mmol/L (ref 96–106)
Creatinine, Ser: 1.37 mg/dL — ABNORMAL HIGH (ref 0.76–1.27)
Glucose: 148 mg/dL — ABNORMAL HIGH (ref 70–99)
Potassium: 4.4 mmol/L (ref 3.5–5.2)
Sodium: 141 mmol/L (ref 134–144)
eGFR: 58 mL/min/{1.73_m2} — ABNORMAL LOW (ref >59)

## 2023-08-12 LAB — URINE CULTURE: Organism ID, Bacteria: NO GROWTH

## 2023-08-12 LAB — GC/CHLAMYDIA PROBE AMP
Chlamydia trachomatis, NAA: NEGATIVE
Neisseria Gonorrhoeae by PCR: NEGATIVE

## 2023-08-13 ENCOUNTER — Encounter: Payer: Self-pay | Admitting: Internal Medicine

## 2023-08-14 ENCOUNTER — Other Ambulatory Visit (HOSPITAL_COMMUNITY): Payer: Self-pay

## 2023-08-16 ENCOUNTER — Ambulatory Visit
Admission: RE | Admit: 2023-08-16 | Discharge: 2023-08-16 | Disposition: A | Discharge status: Home / Self Care | Payer: Commercial Managed Care - PPO | Source: Ambulatory Visit | Attending: Internal Medicine | Admitting: Internal Medicine

## 2023-08-16 DIAGNOSIS — R310 Gross hematuria: Secondary | ICD-10-CM | POA: Diagnosis not present

## 2023-08-16 DIAGNOSIS — M79605 Pain in left leg: Secondary | ICD-10-CM | POA: Diagnosis not present

## 2023-08-16 DIAGNOSIS — R109 Unspecified abdominal pain: Secondary | ICD-10-CM

## 2023-08-16 DIAGNOSIS — K8020 Calculus of gallbladder without cholecystitis without obstruction: Secondary | ICD-10-CM | POA: Diagnosis not present

## 2023-08-19 ENCOUNTER — Other Ambulatory Visit: Payer: Commercial Managed Care - PPO

## 2023-08-27 ENCOUNTER — Other Ambulatory Visit: Payer: Self-pay | Admitting: Internal Medicine

## 2023-08-27 ENCOUNTER — Other Ambulatory Visit: Payer: Self-pay

## 2023-08-30 ENCOUNTER — Other Ambulatory Visit (HOSPITAL_COMMUNITY): Payer: Self-pay

## 2023-08-30 ENCOUNTER — Other Ambulatory Visit: Payer: Self-pay

## 2023-08-30 MED ORDER — ROSUVASTATIN CALCIUM 20 MG PO TABS
20.0000 mg | ORAL_TABLET | Freq: Every day | ORAL | 10 refills | Status: DC
  Filled 2023-08-30 – 2023-09-02 (×2): qty 30, 30d supply, fill #0
  Filled 2023-09-27 – 2023-11-05 (×4): qty 30, 30d supply, fill #1
  Filled 2023-12-08 – 2024-01-01 (×2): qty 30, 30d supply, fill #2

## 2023-09-01 ENCOUNTER — Other Ambulatory Visit: Payer: Self-pay

## 2023-09-02 ENCOUNTER — Other Ambulatory Visit (HOSPITAL_BASED_OUTPATIENT_CLINIC_OR_DEPARTMENT_OTHER): Payer: Self-pay

## 2023-09-02 ENCOUNTER — Other Ambulatory Visit (HOSPITAL_COMMUNITY): Payer: Self-pay

## 2023-09-02 ENCOUNTER — Other Ambulatory Visit: Payer: Self-pay

## 2023-09-08 ENCOUNTER — Encounter: Payer: Commercial Managed Care - PPO | Admitting: Internal Medicine

## 2023-09-08 ENCOUNTER — Other Ambulatory Visit (HOSPITAL_COMMUNITY): Payer: Self-pay

## 2023-09-08 ENCOUNTER — Other Ambulatory Visit: Payer: Self-pay

## 2023-09-09 ENCOUNTER — Telehealth: Payer: Self-pay

## 2023-09-09 NOTE — Telephone Encounter (Signed)
[facility] called to inform us that they attempted to reach patient multiple times to schedule him, but have been unable to get a hold of him. I called patient to follow up on the referral, but was only able to leave a voicemail asking him to call us back.

## 2023-09-13 ENCOUNTER — Encounter: Payer: Self-pay | Admitting: Internal Medicine

## 2023-09-27 ENCOUNTER — Other Ambulatory Visit (HOSPITAL_COMMUNITY): Payer: Self-pay

## 2023-09-27 ENCOUNTER — Other Ambulatory Visit: Payer: Self-pay

## 2023-09-29 ENCOUNTER — Other Ambulatory Visit: Payer: Self-pay

## 2023-09-30 ENCOUNTER — Other Ambulatory Visit: Payer: Self-pay

## 2023-10-01 ENCOUNTER — Other Ambulatory Visit (HOSPITAL_COMMUNITY): Payer: Self-pay

## 2023-10-06 NOTE — Telephone Encounter (Signed)
Lmtrc

## 2023-10-06 NOTE — Telephone Encounter (Signed)
lmtrc

## 2023-10-06 NOTE — Telephone Encounter (Signed)
Please keep calling weekly to get hold of him.  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

## 2023-10-11 ENCOUNTER — Other Ambulatory Visit (HOSPITAL_COMMUNITY): Payer: Self-pay

## 2023-10-11 ENCOUNTER — Other Ambulatory Visit: Payer: Self-pay

## 2023-10-13 ENCOUNTER — Other Ambulatory Visit (HOSPITAL_COMMUNITY): Payer: Self-pay

## 2023-10-18 ENCOUNTER — Other Ambulatory Visit: Payer: Self-pay

## 2023-10-19 ENCOUNTER — Other Ambulatory Visit: Payer: Self-pay

## 2023-10-25 ENCOUNTER — Other Ambulatory Visit: Payer: Self-pay

## 2023-10-29 ENCOUNTER — Other Ambulatory Visit: Payer: Self-pay

## 2023-11-01 ENCOUNTER — Other Ambulatory Visit (HOSPITAL_COMMUNITY): Payer: Self-pay

## 2023-11-02 ENCOUNTER — Other Ambulatory Visit: Payer: Self-pay

## 2023-11-02 ENCOUNTER — Other Ambulatory Visit (HOSPITAL_COMMUNITY): Payer: Self-pay

## 2023-11-05 ENCOUNTER — Other Ambulatory Visit: Payer: Self-pay

## 2023-11-05 ENCOUNTER — Other Ambulatory Visit (HOSPITAL_COMMUNITY): Payer: Self-pay

## 2023-11-06 ENCOUNTER — Other Ambulatory Visit (HOSPITAL_COMMUNITY): Payer: Self-pay

## 2023-11-22 ENCOUNTER — Other Ambulatory Visit: Payer: Commercial Managed Care - PPO

## 2023-11-22 DIAGNOSIS — E1165 Type 2 diabetes mellitus with hyperglycemia: Secondary | ICD-10-CM | POA: Diagnosis not present

## 2023-11-22 DIAGNOSIS — E785 Hyperlipidemia, unspecified: Secondary | ICD-10-CM

## 2023-11-22 DIAGNOSIS — Z79899 Other long term (current) drug therapy: Secondary | ICD-10-CM

## 2023-11-22 NOTE — Telephone Encounter (Signed)
 Gave patient number to [facility]. Patient will call to get himself scheduled.

## 2023-11-23 LAB — COMPREHENSIVE METABOLIC PANEL WITH GFR
ALT: 16 IU/L (ref 0–44)
AST: 17 IU/L (ref 0–40)
Albumin: 4.4 g/dL (ref 3.9–4.9)
Alkaline Phosphatase: 74 IU/L (ref 44–121)
BUN/Creatinine Ratio: 20 (ref 10–24)
BUN: 20 mg/dL (ref 8–27)
Bilirubin Total: 0.5 mg/dL (ref 0.0–1.2)
CO2: 25 mmol/L (ref 20–29)
Calcium: 9.6 mg/dL (ref 8.6–10.2)
Chloride: 102 mmol/L (ref 96–106)
Creatinine, Ser: 0.98 mg/dL (ref 0.76–1.27)
Globulin, Total: 2.5 g/dL (ref 1.5–4.5)
Glucose: 125 mg/dL — ABNORMAL HIGH (ref 70–99)
Potassium: 4.2 mmol/L (ref 3.5–5.2)
Sodium: 142 mmol/L (ref 134–144)
Total Protein: 6.9 g/dL (ref 6.0–8.5)
eGFR: 87 mL/min/{1.73_m2} (ref >59)

## 2023-11-23 LAB — CBC WITH DIFFERENTIAL/PLATELET
Basophils Absolute: 0 10*3/uL (ref 0.0–0.2)
Basos: 1 %
EOS (ABSOLUTE): 0.2 10*3/uL (ref 0.0–0.4)
Eos: 5 %
Hematocrit: 44.5 % (ref 37.5–51.0)
Hemoglobin: 14.8 g/dL (ref 13.0–17.7)
Immature Grans (Abs): 0 10*3/uL (ref 0.0–0.1)
Immature Granulocytes: 0 %
Lymphocytes Absolute: 2.1 10*3/uL (ref 0.7–3.1)
Lymphs: 53 %
MCH: 30.7 pg (ref 26.6–33.0)
MCHC: 33.3 g/dL (ref 31.5–35.7)
MCV: 92 fL (ref 79–97)
Monocytes Absolute: 0.4 10*3/uL (ref 0.1–0.9)
Monocytes: 9 %
Neutrophils Absolute: 1.3 10*3/uL — ABNORMAL LOW (ref 1.4–7.0)
Neutrophils: 32 %
Platelets: 210 10*3/uL (ref 150–450)
RBC: 4.82 x10E6/uL (ref 4.14–5.80)
RDW: 13.1 % (ref 11.6–15.4)
WBC: 4 10*3/uL (ref 3.4–10.8)

## 2023-11-23 LAB — LIPID PANEL W/O CHOL/HDL RATIO
Cholesterol, Total: 121 mg/dL (ref 100–199)
HDL: 50 mg/dL (ref >39)
LDL Chol Calc (NIH): 60 mg/dL (ref 0–99)
Triglycerides: 45 mg/dL (ref 0–149)
VLDL Cholesterol Cal: 11 mg/dL (ref 5–40)

## 2023-11-23 LAB — HEMOGLOBIN A1C
Est. average glucose Bld gHb Est-mCnc: 174 mg/dL
Hgb A1c MFr Bld: 7.7 % — ABNORMAL HIGH (ref 4.8–5.6)

## 2023-11-24 ENCOUNTER — Ambulatory Visit: Payer: Commercial Managed Care - PPO | Admitting: Internal Medicine

## 2023-11-24 ENCOUNTER — Encounter: Payer: Self-pay | Admitting: Internal Medicine

## 2023-11-24 VITALS — BP 152/100 | HR 95 | Resp 16 | Ht 65.25 in | Wt 161.0 lb

## 2023-11-24 DIAGNOSIS — E1165 Type 2 diabetes mellitus with hyperglycemia: Secondary | ICD-10-CM

## 2023-11-24 DIAGNOSIS — I152 Hypertension secondary to endocrine disorders: Secondary | ICD-10-CM | POA: Diagnosis not present

## 2023-11-24 DIAGNOSIS — E1159 Type 2 diabetes mellitus with other circulatory complications: Secondary | ICD-10-CM | POA: Diagnosis not present

## 2023-11-24 DIAGNOSIS — E785 Hyperlipidemia, unspecified: Secondary | ICD-10-CM

## 2023-11-24 MED ORDER — DAPAGLIFLOZIN PRO-METFORMIN ER 5-1000 MG PO TB24
1.0000 | ORAL_TABLET | Freq: Two times a day (BID) | ORAL | 11 refills | Status: DC
  Filled 2023-11-24 – 2024-01-01 (×4): qty 60, 30d supply, fill #0

## 2023-11-24 NOTE — Progress Notes (Signed)
    Subjective:    Patient ID: Danny Vazquez, male   DOB: 07/18/1961, 63 y.o.   MRN: [id number]   HPI  Went home as his brother was very ill in [place].  Was unable to pick up his meds beginning of March because of this.     DM:  A1C at 7.7%   2.  Hypertension:  missing evening meds twice weekly as amlodipine in a separate bubble for after 6 p.m.  He would remember if could take with evening meal as ordered.    3.  Hyperlipidemia:  at goal.   Lipid Panel     Component Value Date/Time   CHOL 121 11/22/2023 0842   TRIG 45 11/22/2023 0842   HDL 50 11/22/2023 0842   CHOLHDL 3.4 03/26/2020 1602   CHOLHDL 3.9 05/27/2016 1549   VLDL 53 (H) 05/27/2016 1549   LDLCALC 60 11/22/2023 0842   LABVLDL 11 11/22/2023 0842         Current Meds  Medication Sig   amLODipine (NORVASC) 10 MG tablet Take 1 tablet (10 mg total) by mouth with evening meal   clobetasol cream (TEMOVATE) 0.05 % Apply topically 2 times daily as needed   folic acid (FOLVITE) 800 MCG tablet Take 1 tablet (800 mcg total) by mouth daily with morning meal   loratadine (CLARITIN) 10 MG tablet Take 1 tablet (10 mg total) by mouth daily as needed for allergies   losartan-hydrochlorothiazide (HYZAAR) 100-12.5 MG tablet Take 1 tablet by mouth daily with breakfast   metFORMIN (GLUCOPHAGE-XR) 500 MG 24 hr tablet Take 2 tablets (1,000 mg total) by mouth 2 (two) times daily with breakfast and evening meals   Multiple Vitamin (MULTIVITAMIN) tablet Take 1 tablet by mouth daily.   Olopatadine HCl 0.2 % SOLN Place 1 drop into both eyes daily as needed for allergies.   rosuvastatin (CRESTOR) 20 MG tablet Take 1 tablet (20 mg total) by mouth daily with evening meal.   Semaglutide, 2 MG/DOSE, 8 MG/3ML SOPN Inject 2 mg under the skin once a week as directed.   tadalafil (CIALIS) 10 MG tablet Take 1 tablet by mouth daily as needed for erectile dysfunction. Appointment required for further refills   Zinc 50 MG CAPS Take 50 mg by mouth 2 (two)  times daily as needed.   Allergies  Allergen Reactions   Food Rash    pork   Dulaglutide Diarrhea   Penicillins     REACTION: swelling and itching     Review of Systems    Objective:   BP (!) 152/100 (BP Location: Left Arm, Patient Position: Sitting, Cuff Size: Normal)   Pulse 95   Resp 16   Ht 5' 5.25" (1.657 m)   Wt 161 lb (73 kg)   BMI 26.59 kg/m   Physical Exam NAD HEENT:  PERRL, EOMI,  Neck:  Supple, No adenopathy, no thyromegaly Chest:  CTA CV:  RRR without murmur or rub.  No carotid bruits.  Carotid, radial and DP pulses normal and equal LE:  no edema   Assessment & Plan   DM:  Add Dapagliflozin to Metformin XR so same number of pills.  Repeat A1C in 4 months.    2  Hypertension:  still not well controlled.  To take Amlodipine with evening meal as ordered and not at bedtime.  3.  Hyperlipidemia:  at goal.

## 2023-11-25 ENCOUNTER — Other Ambulatory Visit: Payer: Self-pay

## 2023-11-25 ENCOUNTER — Other Ambulatory Visit (HOSPITAL_COMMUNITY): Payer: Self-pay

## 2023-11-26 ENCOUNTER — Other Ambulatory Visit: Payer: Self-pay

## 2023-12-06 ENCOUNTER — Encounter: Payer: Self-pay | Admitting: Internal Medicine

## 2023-12-06 ENCOUNTER — Other Ambulatory Visit (HOSPITAL_COMMUNITY): Payer: Self-pay

## 2023-12-07 ENCOUNTER — Other Ambulatory Visit

## 2023-12-07 VITALS — BP 150/100 | HR 101

## 2023-12-07 DIAGNOSIS — Z0130 Encounter for examination of blood pressure without abnormal findings: Secondary | ICD-10-CM

## 2023-12-07 DIAGNOSIS — Z23 Encounter for immunization: Secondary | ICD-10-CM

## 2023-12-07 NOTE — Progress Notes (Unsigned)
 Patient reports taking medication every morning around the same time.    Did not take medication this morning.  Reports two blood pressure medication

## 2023-12-08 ENCOUNTER — Ambulatory Visit: Admitting: Internal Medicine

## 2023-12-08 ENCOUNTER — Other Ambulatory Visit: Payer: Self-pay

## 2023-12-08 DIAGNOSIS — Z23 Encounter for immunization: Secondary | ICD-10-CM | POA: Diagnosis not present

## 2023-12-09 ENCOUNTER — Other Ambulatory Visit: Payer: Self-pay

## 2023-12-10 ENCOUNTER — Other Ambulatory Visit: Payer: Self-pay

## 2023-12-14 ENCOUNTER — Other Ambulatory Visit: Payer: Self-pay

## 2023-12-15 ENCOUNTER — Other Ambulatory Visit: Payer: Self-pay

## 2023-12-16 ENCOUNTER — Other Ambulatory Visit: Payer: Self-pay

## 2023-12-22 ENCOUNTER — Other Ambulatory Visit: Payer: Self-pay

## 2023-12-31 ENCOUNTER — Other Ambulatory Visit: Payer: Self-pay

## 2024-01-01 ENCOUNTER — Other Ambulatory Visit (HOSPITAL_COMMUNITY): Payer: Self-pay

## 2024-01-03 ENCOUNTER — Other Ambulatory Visit: Payer: Self-pay

## 2024-01-03 ENCOUNTER — Other Ambulatory Visit (HOSPITAL_COMMUNITY): Payer: Self-pay

## 2024-01-04 ENCOUNTER — Other Ambulatory Visit: Payer: Self-pay

## 2024-01-04 ENCOUNTER — Other Ambulatory Visit (HOSPITAL_COMMUNITY): Payer: Self-pay

## 2024-01-21 ENCOUNTER — Other Ambulatory Visit (HOSPITAL_COMMUNITY): Payer: Self-pay

## 2024-01-21 ENCOUNTER — Other Ambulatory Visit: Payer: Self-pay | Admitting: Internal Medicine

## 2024-01-24 ENCOUNTER — Other Ambulatory Visit (HOSPITAL_COMMUNITY): Payer: Self-pay

## 2024-01-24 ENCOUNTER — Other Ambulatory Visit (HOSPITAL_BASED_OUTPATIENT_CLINIC_OR_DEPARTMENT_OTHER): Payer: Self-pay

## 2024-01-24 ENCOUNTER — Other Ambulatory Visit: Payer: Self-pay

## 2024-01-24 MED ORDER — OZEMPIC (2 MG/DOSE) 8 MG/3ML ~~LOC~~ SOPN
2.0000 mg | PEN_INJECTOR | SUBCUTANEOUS | 5 refills | Status: DC
  Filled 2024-01-24: qty 3, 28d supply, fill #0

## 2024-01-26 ENCOUNTER — Other Ambulatory Visit: Payer: Self-pay

## 2024-01-26 ENCOUNTER — Other Ambulatory Visit (HOSPITAL_COMMUNITY): Payer: Self-pay

## 2024-01-26 MED ORDER — OZEMPIC (2 MG/DOSE) 8 MG/3ML ~~LOC~~ SOPN
2.0000 mg | PEN_INJECTOR | SUBCUTANEOUS | 2 refills | Status: AC
  Filled 2024-01-26: qty 9, 84d supply, fill #0
  Filled 2024-05-02: qty 9, 84d supply, fill #1
  Filled 2024-07-07 – 2024-07-18 (×2): qty 9, 84d supply, fill #2

## 2024-01-28 ENCOUNTER — Other Ambulatory Visit: Payer: Self-pay

## 2024-01-29 ENCOUNTER — Other Ambulatory Visit (HOSPITAL_COMMUNITY): Payer: Self-pay

## 2024-02-01 ENCOUNTER — Other Ambulatory Visit (HOSPITAL_COMMUNITY): Payer: Self-pay

## 2024-02-01 ENCOUNTER — Telehealth: Payer: Self-pay

## 2024-02-01 NOTE — Telephone Encounter (Signed)
 Patient would like a prescription for Atovaquone   Patient states he needs this medication to be taken at least 1 week before international trip where he could be exposed to mosquitoes.  Patient would like prescription to last for 60 days.

## 2024-02-03 ENCOUNTER — Encounter: Payer: Self-pay | Admitting: Internal Medicine

## 2024-02-03 ENCOUNTER — Other Ambulatory Visit (HOSPITAL_COMMUNITY): Payer: Self-pay

## 2024-02-03 ENCOUNTER — Ambulatory Visit (INDEPENDENT_AMBULATORY_CARE_PROVIDER_SITE_OTHER): Payer: Commercial Managed Care - PPO | Admitting: Internal Medicine

## 2024-02-03 VITALS — BP 160/100 | HR 90 | Resp 16 | Ht 65.25 in | Wt 163.0 lb

## 2024-02-03 DIAGNOSIS — E1165 Type 2 diabetes mellitus with hyperglycemia: Secondary | ICD-10-CM | POA: Diagnosis not present

## 2024-02-03 DIAGNOSIS — Z0000 Encounter for general adult medical examination without abnormal findings: Secondary | ICD-10-CM | POA: Diagnosis not present

## 2024-02-03 DIAGNOSIS — E1159 Type 2 diabetes mellitus with other circulatory complications: Secondary | ICD-10-CM

## 2024-02-03 DIAGNOSIS — I152 Hypertension secondary to endocrine disorders: Secondary | ICD-10-CM

## 2024-02-03 DIAGNOSIS — B353 Tinea pedis: Secondary | ICD-10-CM

## 2024-02-03 DIAGNOSIS — Z91148 Patient's other noncompliance with medication regimen for other reason: Secondary | ICD-10-CM | POA: Diagnosis not present

## 2024-02-03 DIAGNOSIS — E11319 Type 2 diabetes mellitus with unspecified diabetic retinopathy without macular edema: Secondary | ICD-10-CM

## 2024-02-03 DIAGNOSIS — Z125 Encounter for screening for malignant neoplasm of prostate: Secondary | ICD-10-CM | POA: Diagnosis not present

## 2024-02-03 DIAGNOSIS — B351 Tinea unguium: Secondary | ICD-10-CM

## 2024-02-03 DIAGNOSIS — E785 Hyperlipidemia, unspecified: Secondary | ICD-10-CM

## 2024-02-03 MED ORDER — ATOVAQUONE-PROGUANIL HCL 250-100 MG PO TABS
1.0000 | ORAL_TABLET | Freq: Every day | ORAL | 0 refills | Status: AC
  Filled 2024-02-03: qty 60, 60d supply, fill #0

## 2024-02-03 MED ORDER — TERBINAFINE HCL 1 % EX CREA
TOPICAL_CREAM | CUTANEOUS | 1 refills | Status: AC
Start: 2024-02-03 — End: ?
  Filled 2024-02-03: qty 30, 14d supply, fill #0

## 2024-02-03 NOTE — Progress Notes (Signed)
 Subjective:    Patient ID: Danny Vazquez, male   DOB: Nov 27, 1960, 63 y.o.   MRN: [id number]   HPI  Here for Male CPE:  1.  STE:  Does perform monthly.  No concerning findings.  No family history of testicular cancer.    2.  PSA:  Last was 1 year ago and normal at 0.4.  No family history of prostate cancer.    3.  Guaiac Cards/FIT:  Has never returned   4.  Colonoscopy:   Last 10/2022 with 3 tubular adenomas without high grade dysplasia.  Dr. Dominic Friendly.  Plan is to repeat every 2 years.   5.  Cholesterol/Glucose:  Cholesterol at goal in March 2025.  A1C was improved at 7.7%.  He had Farxiga  added, but never obtained as too expensive.  Remained on the Metformin .    Lipid Panel     Component Value Date/Time   CHOL 121 11/22/2023 0842   TRIG 45 11/22/2023 0842   HDL 50 11/22/2023 0842   CHOLHDL 3.4 03/26/2020 1602   CHOLHDL 3.9 05/27/2016 1549   VLDL 53 (H) 05/27/2016 1549   LDLCALC 60 11/22/2023 0842   LABVLDL 11 11/22/2023 0842     6.  Immunizations:  Declines COVID vaccine.   Immunization History  Administered Date(s) Administered   Hep B, Unspecified 09/05/2000, 08/05/2001, 02/06/2002   Influenza Whole 05/29/2009, 06/13/2010   Influenza, Seasonal, Injecte, Preservative Fre 06/01/2011, 06/23/2023   Influenza,inj,Quad PF,6+ Mos 05/16/2014, 05/08/2015, 05/05/2016, 05/04/2017, 07/07/2018, 05/29/2019, 06/17/2020   MMR 11/16/2005, 06/01/2011   Meningococcal Acwy, Unspecified 09/01/1985, 08/14/1988, 09/11/1991   Novel Infuenza-h1n1-09 08/08/2008   PFIZER(Purple Top)SARS-COV-2 Vaccination 10/24/2019, 11/15/2019, 07/27/2020   PNEUMOCOCCAL CONJUGATE-20 12/08/2023   Pfizer Covid-19 Vaccine Bivalent Booster 80yrs & up 05/27/2021   Pneumococcal Polysaccharide-23 07/11/2009   Td 12/02/2001, 08/29/2007   Tdap 06/01/2011, 09/26/2020   Tetanus 12/13/1986, 01/12/1987, 12/13/1987, 01/10/1994   Yellow Fever 01/25/1984, 04/08/1996   Zoster Recombinant(Shingrix ) 02/09/2023, 07/07/2023    7.  Other:  hypertension:  does have pills left in packaging twice weekly.  Generally, if misses, it is his evening meds.  In the morning, he eats at work and takes his meds then as well.    Current Meds  Medication Sig   amLODipine  (NORVASC ) 10 MG tablet Take 1 tablet (10 mg total) by mouth daily with evening meal.   blood glucose meter kit and supplies KIT Use up to four times daily as directed. Dispense based on pt & ins preference. Dx: E11.65, Z79.4   clobetasol  cream (TEMOVATE ) 0.05 % Apply topically 2 times daily as needed   folic acid  (FOLVITE ) 800 MCG tablet Take 1 tablet (800 mcg total) by mouth daily with morning meal   loratadine  (CLARITIN ) 10 MG tablet Take 1 tablet (10 mg total) by mouth daily as needed for allergies   losartan -hydrochlorothiazide  (HYZAAR ) 100-12.5 MG tablet Take 1 tablet by mouth daily with breakfast   metFORMIN  (GLUCOPHAGE ) 1000 MG tablet Take 1,000 mg by mouth 2 (two) times daily with a meal.   Multiple Vitamin (MULTIVITAMIN) tablet Take 1 tablet by mouth daily.   Olopatadine  HCl 0.2 % SOLN Place 1 drop into both eyes daily as needed for allergies.   rosuvastatin  (CRESTOR ) 20 MG tablet Take 1 tablet (20 mg total) by mouth daily with evening meal.   Semaglutide , 2 MG/DOSE, (OZEMPIC , 2 MG/DOSE,) 8 MG/3ML SOPN Inject 2 mg under the skin once a week as directed.   tadalafil  (CIALIS ) 10 MG tablet Take 1 tablet  by mouth daily as needed for erectile dysfunction. Appointment required for further refills   Zinc 50 MG CAPS Take 50 mg by mouth 2 (two) times daily as needed.   Allergies  Allergen Reactions   Food Rash    pork   Dulaglutide  Diarrhea   Penicillins     REACTION: swelling and itching   Past Medical History:  Diagnosis Date   Asymptomatic gallstones 07/2023   Diabetes mellitus without complication (HCC)    ED (erectile dysfunction)    HTN (hypertension)    Hyperlipidemia    Seasonal allergies    Sleep apnea    Past Surgical History:  Procedure  Laterality Date   None     Family History  Problem Relation Age of Onset   Diabetes Mother    Hypertension Mother    Hypertension Father    Heart disease Father        CHF   Stomach cancer Brother    Cancer Brother 87       Does not have all the info, but some sort of intestinal cancer.   Hypertension Brother    Diabetes Brother    Diabetes Brother    Hypertension Brother    Hypertension Brother    Diabetes Brother    Heart disease Brother        CHF was cause of death   Hypertension Brother    Diabetes Brother    Diabetes Brother    Hypertension Brother    Hypertension Brother    Diabetes Brother    Diabetes Brother    Hypertension Brother    Hypertension Brother    Diabetes Brother    Ulcers Daughter    Colon cancer Neg Hx    Colon polyps Neg Hx    Esophageal cancer Neg Hx    Rectal cancer Neg Hx    Social History   Socioeconomic History   Marital status: Media planner    Spouse name: Cato Cockayne   Number of children: 4   Years of education: Not on file   Highest education level: Master's degree (e.g., MA, MS, MEng, MEd, MSW, MBA)  Occupational History   Occupation: [employer]    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  Student at [employer]   One daughter in [place], one in [place] near [place], and 1 in [place]    Social Drivers of Health   Financial Resource Strain: Low Risk  (02/03/2024)   Overall Financial Resource Strain (CARDIA)    Difficulty of Paying Living Expenses: Not very hard  Food Insecurity: No Food Insecurity (02/03/2024)   Hunger Vital Sign    Worried About Running Out of Food in the Last Year: Never  true    Ran Out of Food in the Last Year: Never true  Transportation Needs: No Transportation Needs (02/03/2023)   PRAPARE - Administrator, Civil Service (Medical): No    Lack of Transportation (Non-Medical): No  Physical Activity: Not on file  Stress: Not on file  Social Connections: Unknown (01/05/2022)   Received from [facility]  Social Network    Social Network: Not on file  Intimate Partner Violence: Not At Risk (02/03/2024)   Humiliation, Afraid, Rape, and Kick questionnaire    Fear of Current or Ex-Partner: No    Emotionally Abused: No    Physically Abused: No    Sexually Abused: No     Review of Systems  HENT:  Negative for dental problem (Every 6 months:  [facility].  [facility] Dent[facility] for partial.).   Eyes:  Negative for visual disturbance (Goes to Dr. Lasandra Points for basic eye care and Dr. Lenon Radar for retina.  Has not made an appt in past year yet.  Last appt with Dr. Lenon Radar 02/22/2023).  Respiratory:  Negative for shortness of breath.   Cardiovascular:  Negative for chest pain, palpitations and leg swelling.  Gastrointestinal:  Negative for abdominal pain and blood in stool (No melena).  Neurological:  Positive for light-headedness (Sudden onset 2 weeks ago.  Lasts just seconds.  May have been a bit dehydrated while at work.  Limits his liquids at school.  Also occured 2 other times in same week at home.  States he drank water and sat and felt better.  No CP, dyspnea, palpitations.). Negative for weakness and numbness.      Objective:   BP (!) 160/100 (Cuff Size: Normal)   Pulse 90   Resp 16   Ht 5' 5.25 (1.657 m)   Wt 163 lb (73.9 kg)   SpO2 98%   BMI 26.92 kg/m   Physical Exam HENT:     Head: Normocephalic and atraumatic.     Right Ear: Tympanic membrane, ear canal and external ear normal.     Left Ear: Tympanic membrane, ear canal and external ear normal.     Nose: Nose normal.     Mouth/Throat:     Mouth: Mucous membranes are moist.      Pharynx: Oropharynx is clear.   Eyes:     Extraocular Movements: Extraocular movements intact.     Conjunctiva/sclera: Conjunctivae normal.     Pupils: Pupils are equal, round, and reactive to light.     Comments: Discs sharp  Neck:     Thyroid: No thyroid mass or thyromegaly.   Cardiovascular:     Rate and Rhythm: Normal rate and regular rhythm.     Pulses:          Dorsalis pedis pulses are 2+ on the right side and 2+ on the left side.       Posterior tibial pulses are 2+ on the right side and 2+ on the left side.     Heart sounds: S1 normal and S2 normal. No murmur heard.    No friction rub. No S3 or S4 sounds.     Comments: No carotid bruits.  Carotid, radial, femoral, DP and PT pulses normal and equal.   Pulmonary:     Effort: Pulmonary effort is normal.     Breath sounds: Normal breath sounds and air entry.  Abdominal:     Palpations: Abdomen is soft. There is no hepatomegaly, splenomegaly or mass.     Tenderness: There is no abdominal tenderness.     Hernia: No hernia is present.  Genitourinary:    Penis: Normal and circumcised.      Testes:        Right: Mass, tenderness or swelling not present. Right testis is descended.        Left: Mass, tenderness or swelling not present. Left testis is descended.  Musculoskeletal:        General: Normal range of motion.     Cervical back: Normal range of motion and neck supple.     Right lower leg: No edema.     Left lower leg: No edema.       Feet:  Feet:     Right foot:     Protective Sensation: 10 sites tested.  10 sites sensed.     Skin integrity: Dry skin present.     Toenail Condition: Right toenails are abnormally thick.     Left foot:     Protective Sensation: 10 sites tested.  10 sites sensed.     Skin integrity: Skin integrity normal.     Toenail Condition: Left toenails are normal.     Comments: Thickening and flaking of toenails on right--great and 5th toes Lymphadenopathy:     Head:     Right side of head:  No submental or submandibular adenopathy.     Left side of head: No submental or submandibular adenopathy.     Cervical: No cervical adenopathy.     Upper Body:     Right upper body: No supraclavicular or axillary adenopathy.     Left upper body: No supraclavicular or axillary adenopathy.     Lower Body: No right inguinal adenopathy. No left inguinal adenopathy.   Skin:    General: Skin is warm.     Capillary Refill: Capillary refill takes less than 2 seconds.     Findings: No rash.   Neurological:     General: No focal deficit present.     Mental Status: He is alert and oriented to person, place, and time.     Cranial Nerves: Cranial nerves 2-12 are intact.     Sensory: Sensation is intact.     Motor: Motor function is intact.     Coordination: Coordination is intact.     Gait: Gait is intact.     Deep Tendon Reflexes: Reflexes are normal and symmetric.   Psychiatric:        Mood and Affect: Mood normal.        Speech: Speech normal.        Behavior: Behavior normal. Behavior is cooperative.      Assessment & Plan   CPE PSA  Declined COVID vaccination.  2.  Chronic non compliance with meds:  [facility] shared they have been unable to contact him to send prepackaged medications as he states today he was taking.  He missed an entire month of meds in April.  He also admits to missing meds that are available to him at home.   He has not filled Metformin  since January. He states he is taking, however.   Long discussion again regarding concerns for poor outcome with his health if he does not start making sure he is getting his meds.  He has for years denied barrier to obtaining meds or remembering to take them.  He cannot say what the issue is. Encouraged him to get his meds sorted out with pharmacy before leaving for [place].   3.  DM:  fair control at last check , though not at goal.  A1C.  4.  Hyperlipidemia:  At goal in March.  5.  Tinea pedis and onychomycosis:   Terbinafine  1% twice daily to foot for at tleast 14 days.  Will consider oral med for 84 days when returns to [place]    4.  Hypertension:  Poorly controlled.  Is missing  his meds by his own admission, in the evenings, but based on med fill, he is missing regularly.  Discussed concern for MI or stroke.  5.  Malaria prophyaxis for travel.  Atovaquone /Proguanil  250/100mg  daily starting 2 days before travel and to continue until 7 days after return.

## 2024-02-03 NOTE — Telephone Encounter (Signed)
 Patient is seeing pcp on 02/03/2024

## 2024-02-04 ENCOUNTER — Other Ambulatory Visit (HOSPITAL_COMMUNITY): Payer: Self-pay

## 2024-02-04 LAB — MICROALBUMIN / CREATININE URINE RATIO
Creatinine, Urine: 89.3 mg/dL
Microalb/Creat Ratio: 93 mg/g{creat} — ABNORMAL HIGH (ref 0–29)
Microalbumin, Urine: 82.9 ug/mL

## 2024-02-04 LAB — PSA: Prostate Specific Ag, Serum: 0.3 ng/mL (ref 0.0–4.0)

## 2024-02-04 LAB — HGB A1C W/O EAG: Hgb A1c MFr Bld: 8 % — ABNORMAL HIGH (ref 4.8–5.6)

## 2024-02-07 ENCOUNTER — Ambulatory Visit: Payer: Self-pay | Admitting: Internal Medicine

## 2024-02-07 ENCOUNTER — Other Ambulatory Visit (HOSPITAL_COMMUNITY): Payer: Self-pay

## 2024-02-17 ENCOUNTER — Other Ambulatory Visit: Payer: Self-pay

## 2024-03-01 ENCOUNTER — Other Ambulatory Visit: Payer: Self-pay

## 2024-03-28 ENCOUNTER — Other Ambulatory Visit

## 2024-04-07 ENCOUNTER — Other Ambulatory Visit

## 2024-04-07 DIAGNOSIS — E1165 Type 2 diabetes mellitus with hyperglycemia: Secondary | ICD-10-CM

## 2024-04-08 LAB — HEMOGLOBIN A1C
Est. average glucose Bld gHb Est-mCnc: 163 mg/dL
Hgb A1c MFr Bld: 7.3 % — ABNORMAL HIGH (ref 4.8–5.6)

## 2024-05-01 ENCOUNTER — Other Ambulatory Visit (HOSPITAL_COMMUNITY): Payer: Self-pay

## 2024-05-02 ENCOUNTER — Other Ambulatory Visit: Payer: Self-pay | Admitting: Internal Medicine

## 2024-05-02 ENCOUNTER — Other Ambulatory Visit (HOSPITAL_COMMUNITY): Payer: Self-pay

## 2024-05-03 ENCOUNTER — Other Ambulatory Visit (HOSPITAL_COMMUNITY): Payer: Self-pay

## 2024-05-03 ENCOUNTER — Other Ambulatory Visit: Payer: Self-pay

## 2024-05-04 ENCOUNTER — Other Ambulatory Visit (HOSPITAL_COMMUNITY): Payer: Self-pay

## 2024-05-08 ENCOUNTER — Telehealth: Payer: Self-pay | Admitting: Internal Medicine

## 2024-05-08 ENCOUNTER — Other Ambulatory Visit (HOSPITAL_COMMUNITY): Payer: Self-pay

## 2024-05-08 ENCOUNTER — Other Ambulatory Visit: Payer: Self-pay

## 2024-05-08 MED ORDER — METFORMIN HCL ER 500 MG PO TB24
1000.0000 mg | ORAL_TABLET | Freq: Two times a day (BID) | ORAL | 8 refills | Status: DC
  Filled 2024-05-08 (×2): qty 60, 15d supply, fill #0

## 2024-05-10 ENCOUNTER — Other Ambulatory Visit (HOSPITAL_COMMUNITY): Payer: Self-pay

## 2024-05-13 ENCOUNTER — Other Ambulatory Visit (HOSPITAL_COMMUNITY): Payer: Self-pay

## 2024-05-13 MED ORDER — FLUZONE 0.5 ML IM SUSY
0.5000 mL | PREFILLED_SYRINGE | Freq: Once | INTRAMUSCULAR | 0 refills | Status: AC
Start: 2024-05-13 — End: 2024-05-14
  Filled 2024-05-13: qty 0.5, 1d supply, fill #0

## 2024-05-23 ENCOUNTER — Ambulatory Visit: Payer: Self-pay | Admitting: Internal Medicine

## 2024-06-07 ENCOUNTER — Other Ambulatory Visit: Payer: Self-pay | Admitting: Internal Medicine

## 2024-06-07 ENCOUNTER — Other Ambulatory Visit: Payer: Self-pay

## 2024-06-07 ENCOUNTER — Other Ambulatory Visit (HOSPITAL_COMMUNITY): Payer: Self-pay

## 2024-06-07 MED ORDER — ROSUVASTATIN CALCIUM 20 MG PO TABS
20.0000 mg | ORAL_TABLET | Freq: Every day | ORAL | 10 refills | Status: AC
  Filled 2024-06-07: qty 30, 30d supply, fill #0
  Filled 2024-07-04: qty 30, 30d supply, fill #1
  Filled 2024-07-28 – 2024-08-01 (×2): qty 30, 30d supply, fill #2
  Filled 2024-08-31: qty 30, 30d supply, fill #3

## 2024-06-07 MED ORDER — LOSARTAN POTASSIUM-HCTZ 100-12.5 MG PO TABS
1.0000 | ORAL_TABLET | Freq: Every day | ORAL | 11 refills | Status: AC
  Filled 2024-06-07: qty 30, 30d supply, fill #0
  Filled 2024-07-04: qty 30, 30d supply, fill #1
  Filled 2024-07-28 – 2024-08-01 (×2): qty 30, 30d supply, fill #2
  Filled 2024-08-31: qty 30, 30d supply, fill #3

## 2024-06-07 MED ORDER — AMLODIPINE BESYLATE 10 MG PO TABS
10.0000 mg | ORAL_TABLET | Freq: Every day | ORAL | 11 refills | Status: AC
  Filled 2024-06-07: qty 30, 30d supply, fill #0
  Filled 2024-07-04: qty 30, 30d supply, fill #1
  Filled 2024-07-28 – 2024-08-01 (×2): qty 30, 30d supply, fill #2
  Filled 2024-08-31: qty 30, 30d supply, fill #3

## 2024-06-07 MED ORDER — METFORMIN HCL ER 500 MG PO TB24
1000.0000 mg | ORAL_TABLET | Freq: Two times a day (BID) | ORAL | 11 refills | Status: AC
  Filled 2024-06-07: qty 120, 30d supply, fill #0
  Filled 2024-07-04: qty 120, 30d supply, fill #1
  Filled 2024-07-28 – 2024-08-01 (×2): qty 120, 30d supply, fill #2
  Filled 2024-08-31: qty 120, 30d supply, fill #3

## 2024-06-07 MED ORDER — FOLIC ACID 800 MCG PO TABS
800.0000 ug | ORAL_TABLET | Freq: Every day | ORAL | 11 refills | Status: AC
  Filled 2024-06-07 – 2024-07-04 (×2): qty 30, 30d supply, fill #0
  Filled 2024-07-28 – 2024-08-01 (×2): qty 30, 30d supply, fill #1
  Filled 2024-08-31: qty 30, 30d supply, fill #2

## 2024-06-07 NOTE — Telephone Encounter (Signed)
 Hold on the letter--I called and got hold of him.  He states he did receive bubble packs at one time.   Had to go home for his mother's funeral and states the bubble packs were stopped and when he went to pharmacy to ask why, he could not get a good answer Discussed he needs to notify us  if he is having difficulty getting med. He does not answer when asked what is keeping him from taking meds. While above may be true, he has had years of issues with compliance  Will refill bubble packs again He is to call and confirm he is able to pick up

## 2024-06-08 ENCOUNTER — Other Ambulatory Visit (HOSPITAL_COMMUNITY): Payer: Self-pay

## 2024-06-09 ENCOUNTER — Other Ambulatory Visit (HOSPITAL_COMMUNITY): Payer: Self-pay

## 2024-06-09 ENCOUNTER — Other Ambulatory Visit: Payer: Self-pay

## 2024-06-13 ENCOUNTER — Other Ambulatory Visit: Payer: Self-pay

## 2024-06-18 ENCOUNTER — Other Ambulatory Visit (HOSPITAL_COMMUNITY): Payer: Self-pay

## 2024-07-04 ENCOUNTER — Other Ambulatory Visit (HOSPITAL_COMMUNITY): Payer: Self-pay

## 2024-07-05 ENCOUNTER — Other Ambulatory Visit: Payer: Self-pay

## 2024-07-06 ENCOUNTER — Other Ambulatory Visit: Payer: Self-pay

## 2024-07-07 ENCOUNTER — Other Ambulatory Visit: Payer: Self-pay

## 2024-07-08 ENCOUNTER — Other Ambulatory Visit (HOSPITAL_COMMUNITY): Payer: Self-pay

## 2024-07-10 ENCOUNTER — Other Ambulatory Visit: Payer: Self-pay

## 2024-07-18 ENCOUNTER — Other Ambulatory Visit: Payer: Self-pay | Admitting: Internal Medicine

## 2024-07-18 ENCOUNTER — Other Ambulatory Visit: Payer: Self-pay

## 2024-07-18 ENCOUNTER — Other Ambulatory Visit (HOSPITAL_COMMUNITY): Payer: Self-pay

## 2024-07-19 ENCOUNTER — Other Ambulatory Visit (HOSPITAL_COMMUNITY): Payer: Self-pay

## 2024-07-19 ENCOUNTER — Other Ambulatory Visit: Payer: Self-pay

## 2024-07-27 MED ORDER — TADALAFIL 10 MG PO TABS
10.0000 mg | ORAL_TABLET | Freq: Every day | ORAL | 6 refills | Status: AC | PRN
  Filled 2024-07-27 – 2024-08-01 (×2): qty 10, 10d supply, fill #0

## 2024-07-28 ENCOUNTER — Other Ambulatory Visit (HOSPITAL_COMMUNITY): Payer: Self-pay

## 2024-07-28 ENCOUNTER — Other Ambulatory Visit: Payer: Self-pay

## 2024-08-01 ENCOUNTER — Other Ambulatory Visit: Payer: Self-pay

## 2024-08-01 ENCOUNTER — Other Ambulatory Visit (HOSPITAL_COMMUNITY): Payer: Self-pay

## 2024-08-04 ENCOUNTER — Other Ambulatory Visit (HOSPITAL_COMMUNITY): Payer: Self-pay

## 2024-08-31 ENCOUNTER — Other Ambulatory Visit: Payer: Self-pay

## 2024-08-31 ENCOUNTER — Other Ambulatory Visit (HOSPITAL_COMMUNITY): Payer: Self-pay

## 2024-09-01 ENCOUNTER — Other Ambulatory Visit: Payer: Self-pay

## 2024-09-01 ENCOUNTER — Other Ambulatory Visit (HOSPITAL_COMMUNITY): Payer: Self-pay

## 2024-09-04 ENCOUNTER — Other Ambulatory Visit: Payer: Self-pay

## 2024-09-05 ENCOUNTER — Other Ambulatory Visit: Payer: Self-pay

## 2024-09-05 ENCOUNTER — Other Ambulatory Visit (HOSPITAL_COMMUNITY): Payer: Self-pay

## 2024-09-06 ENCOUNTER — Other Ambulatory Visit (HOSPITAL_COMMUNITY): Payer: Self-pay

## 2024-09-08 ENCOUNTER — Other Ambulatory Visit: Payer: Self-pay

## 2024-09-11 ENCOUNTER — Other Ambulatory Visit: Payer: Self-pay

## 2024-09-22 ENCOUNTER — Other Ambulatory Visit: Payer: Self-pay
